# Patient Record
Sex: Male | Born: 1980 | Race: White | Hispanic: No | Marital: Single | Smoking: Former smoker
Health system: Southern US, Community
[De-identification: ages and names within clinical notes are randomized; demographics above are authoritative.]

## PROBLEM LIST (undated history)

## (undated) DIAGNOSIS — F32A Depression, unspecified: Secondary | ICD-10-CM

## (undated) DIAGNOSIS — F419 Anxiety disorder, unspecified: Secondary | ICD-10-CM

## (undated) DIAGNOSIS — F1010 Alcohol abuse, uncomplicated: Secondary | ICD-10-CM

## (undated) DIAGNOSIS — K219 Gastro-esophageal reflux disease without esophagitis: Secondary | ICD-10-CM

## (undated) HISTORY — PX: WISDOM TOOTH EXTRACTION: SHX21

## (undated) HISTORY — PX: TONSILLECTOMY: SUR1361

---

## 2012-04-15 ENCOUNTER — Emergency Department: Payer: Self-pay | Admitting: Emergency Medicine

## 2012-04-25 ENCOUNTER — Emergency Department: Payer: Self-pay | Admitting: Emergency Medicine

## 2018-08-03 ENCOUNTER — Other Ambulatory Visit: Payer: Self-pay

## 2018-08-03 ENCOUNTER — Emergency Department
Admission: EM | Admit: 2018-08-03 | Discharge: 2018-08-03 | Disposition: A | Discharge status: Rehabilitation Facility | Payer: Self-pay | Attending: Emergency Medicine | Admitting: Emergency Medicine

## 2018-08-03 DIAGNOSIS — F10920 Alcohol use, unspecified with intoxication, uncomplicated: Secondary | ICD-10-CM

## 2018-08-03 DIAGNOSIS — F419 Anxiety disorder, unspecified: Secondary | ICD-10-CM

## 2018-08-03 DIAGNOSIS — F1020 Alcohol dependence, uncomplicated: Secondary | ICD-10-CM | POA: Insufficient documentation

## 2018-08-03 DIAGNOSIS — F418 Other specified anxiety disorders: Secondary | ICD-10-CM | POA: Insufficient documentation

## 2018-08-03 LAB — CBC WITH DIFFERENTIAL/PLATELET
Abs Immature Granulocytes: 0.02 10*3/uL (ref 0.00–0.07)
Basophils Absolute: 0.1 10*3/uL (ref 0.0–0.1)
Basophils Relative: 1 %
Eosinophils Absolute: 0 10*3/uL (ref 0.0–0.5)
Eosinophils Relative: 0 %
HCT: 45.4 % (ref 39.0–52.0)
Hemoglobin: 16 g/dL (ref 13.0–17.0)
Immature Granulocytes: 0 %
Lymphocytes Relative: 36 %
Lymphs Abs: 2.6 10*3/uL (ref 0.7–4.0)
MCH: 34 pg (ref 26.0–34.0)
MCHC: 35.2 g/dL (ref 30.0–36.0)
MCV: 96.4 fL (ref 80.0–100.0)
Monocytes Absolute: 0.5 10*3/uL (ref 0.1–1.0)
Monocytes Relative: 7 %
Neutro Abs: 4 10*3/uL (ref 1.7–7.7)
Neutrophils Relative %: 56 %
Platelets: 299 10*3/uL (ref 150–400)
RBC: 4.71 MIL/uL (ref 4.22–5.81)
RDW: 12.7 % (ref 11.5–15.5)
WBC: 7.1 10*3/uL (ref 4.0–10.5)
nRBC: 0 % (ref 0.0–0.2)

## 2018-08-03 LAB — COMPREHENSIVE METABOLIC PANEL
ALT: 92 U/L — ABNORMAL HIGH (ref 0–44)
AST: 75 U/L — ABNORMAL HIGH (ref 15–41)
Albumin: 4.1 g/dL (ref 3.5–5.0)
Alkaline Phosphatase: 62 U/L (ref 38–126)
Anion gap: 15 (ref 5–15)
BUN: 14 mg/dL (ref 6–20)
CO2: 23 mmol/L (ref 22–32)
Calcium: 8.8 mg/dL — ABNORMAL LOW (ref 8.9–10.3)
Chloride: 104 mmol/L (ref 98–111)
Creatinine, Ser: 0.92 mg/dL (ref 0.61–1.24)
GFR calc Af Amer: >60 mL/min (ref >60)
GFR calc non Af Amer: >60 mL/min (ref >60)
Glucose, Bld: 112 mg/dL — ABNORMAL HIGH (ref 70–99)
Potassium: 3.5 mmol/L (ref 3.5–5.1)
Sodium: 142 mmol/L (ref 135–145)
Total Bilirubin: 0.6 mg/dL (ref 0.3–1.2)
Total Protein: 7.3 g/dL (ref 6.5–8.1)

## 2018-08-03 LAB — URINE DRUG SCREEN, QUALITATIVE (ARMC ONLY)
Amphetamines, Ur Screen: NOT DETECTED
Barbiturates, Ur Screen: NOT DETECTED
Benzodiazepine, Ur Scrn: NOT DETECTED
Cannabinoid 50 Ng, Ur ~~LOC~~: NOT DETECTED
Cocaine Metabolite,Ur ~~LOC~~: NOT DETECTED
MDMA (Ecstasy)Ur Screen: NOT DETECTED
Methadone Scn, Ur: NOT DETECTED
Opiate, Ur Screen: NOT DETECTED
Phencyclidine (PCP) Ur S: NOT DETECTED
Tricyclic, Ur Screen: NOT DETECTED

## 2018-08-03 LAB — URINALYSIS, COMPLETE (UACMP) WITH MICROSCOPIC
Bacteria, UA: NONE SEEN
Bilirubin Urine: NEGATIVE
Glucose, UA: NEGATIVE mg/dL
Hgb urine dipstick: NEGATIVE
Ketones, ur: 5 mg/dL — AB
Leukocytes, UA: NEGATIVE
Nitrite: NEGATIVE
Protein, ur: 30 mg/dL — AB
Specific Gravity, Urine: 1.015 (ref 1.005–1.030)
pH: 7 (ref 5.0–8.0)

## 2018-08-03 LAB — ETHANOL
Alcohol, Ethyl (B): 333 mg/dL (ref <10)
Alcohol, Ethyl (B): 233 mg/dL — ABNORMAL HIGH (ref <10)

## 2018-08-03 MED ORDER — CALCIUM CARBONATE ANTACID 500 MG PO CHEW
1.0000 | CHEWABLE_TABLET | Freq: Once | ORAL | Status: AC
  Administered 2018-08-03: 200 mg via ORAL

## 2018-08-03 MED ORDER — LORAZEPAM 1 MG PO TABS
1.0000 mg | ORAL_TABLET | Freq: Once | ORAL | Status: AC
  Administered 2018-08-03: 1 mg via ORAL
  Filled 2018-08-03: qty 1

## 2018-08-03 MED ORDER — LORAZEPAM 2 MG/ML IJ SOLN
1.0000 mg | Freq: Once | INTRAMUSCULAR | Status: AC
  Administered 2018-08-03: 1 mg via INTRAVENOUS
  Filled 2018-08-03: qty 1

## 2018-08-03 MED ORDER — CALCIUM CARBONATE ANTACID 500 MG PO CHEW
CHEWABLE_TABLET | ORAL | Status: AC
  Administered 2018-08-03: 200 mg via ORAL
  Filled 2018-08-03: qty 1

## 2018-08-03 MED ORDER — SODIUM CHLORIDE 0.9 % IV BOLUS
1000.0000 mL | Freq: Once | INTRAVENOUS | Status: AC
  Administered 2018-08-03: 1000 mL via INTRAVENOUS

## 2018-08-03 NOTE — ED Notes (Signed)
Pt denies SI/HI/AVH. Pt given discharge instructions including f/u appointments in route to [facility]. Pt states understanding. Pt states receipt of all belongings. Patient accompanied by mother.

## 2018-08-03 NOTE — Discharge Instructions (Addendum)
Go directly to [facility].  Return to emergency department immediately for any worsening condition including confusion altered mental status, fever, thoughts of wanting to hurt yourself or others, seizure, or any other symptoms concerning to you.

## 2018-08-03 NOTE — ED Notes (Signed)
Pt ambulates to the bathroom with a steady gait.

## 2018-08-03 NOTE — BH Assessment (Addendum)
Assessment Note  Chad Walsh is an 37 y.o. male who presents to the ED requesting detox treatment for his alcohol use disorder. Pt reports having "extreme anxiety and it's been getting worse. He drank "a liter of alcohol yesterday and a fifth of liquor today". Pt reports dx of depression and anxiety. He shared that he often binge drinks and drinks "to help me sleep". He reports recent life stressors with his family unit. He currently lives with his sister, brother-in-law, and his mother. He shared that his father recently kicked him out of the house after mom and dad divorced. He denied use of any other drugs. He is currently employed with "[employer]". He reports experiencing withdrawal typically in the AM and "drinks to function daily". He denied past treatment history. He denied SI/HI/AVH. Although, pt reports past suicide attempts to overdose "years ago". He reports having poor sleep patterns and this triggers his drinking binges. Pt was coherent in his speech and appeared to process his thoughts well enough to engage in TTS assessment. He was not observed displaying any psychotic behaviors.   Diagnosis:  Major Depressive Disorder, with anxiety Alcohol Use Disorder, Severe  Past Medical History: History reviewed. No pertinent past medical history.  History reviewed. No pertinent surgical history.  Family History: No family history on file.  Social History:  reports that he drinks alcohol. His tobacco and drug histories are not on file.  Additional Social History:  Alcohol / Drug Use Pain Medications: See MAR Prescriptions: See MAR Over the Counter: See MAR History of alcohol / drug use?: Yes Longest period of sobriety (when/how long): Unable to Quantify Negative Consequences of Use: Financial, Personal relationships, Work / School Withdrawal Symptoms: Agitation, Weakness, Sweats, Irritability, Nausea / Vomiting Substance #1 Name of Substance 1: Alcohol 1 - Age of First Use: Unable to  Recall 1 - Amount (size/oz): "A liter of alcohol yesterday and a fifth of liquor last night" 1 - Frequency: Binge drinking 1 - Duration: "years" 1 - Last Use / Amount: 08/03/2018  CIWA: CIWA-Ar BP: (!) 139/94 Pulse Rate: (!) 115 COWS:    Allergies: No Known Allergies  Home Medications:  (Not in a hospital admission)  OB/GYN Status:  No LMP for male patient.  General Assessment Data Location of Assessment: [facility] ED TTS Assessment: In system Is this a Tele or Face-to-Face Assessment?: Face-to-Face Is this an Initial Assessment or a Re-assessment for this encounter?: Initial Assessment Patient Accompanied by:: N/A Language Other than English: No Living Arrangements: Other (Comment)(Private Dwelling) What gender do you identify as?: Male Marital status: Divorced [place] name: n/a Pregnancy Status: No Living Arrangements: Parent, Other relatives(Pt lives in his sister's home where his mother also lives.) Can pt return to current living arrangement?: Yes Admission Status: Voluntary Is patient capable of signing voluntary admission?: Yes Referral Source: Self/Family/Friend Insurance type: None  Medical Screening Exam [facility] Walk-in ONLY) Medical Exam completed: Yes  Crisis Care Plan Living Arrangements: Parent, Other relatives(Pt lives in his sister's home where his mother also lives.) Legal Guardian: Other:(Self) Name of Psychiatrist: None Reported Name of Therapist: None Reported  Education Status Is patient currently in school?: No Is the patient employed, unemployed or receiving disability?: Employed([employer] - 8 hour shifts)  Risk to self with the past 6 months Suicidal Ideation: No Has patient been a risk to self within the past 6 months prior to admission? : No Suicidal Intent: No Has patient had any suicidal intent within the past 6 months prior to admission? :  No Is patient at risk for suicide?: No Suicidal Plan?: No Has patient had any suicidal plan within the past  6 months prior to admission? : No Access to Means: No What has been your use of drugs/alcohol within the last 12 months?: Alcohol Previous Attempts/Gestures: Yes How many times?: ("a couple of times - I've tried to overdose") Other Self Harm Risks: Severe Alcohol Use Triggers for Past Attempts: Family contact, Spouse contact Intentional Self Injurious Behavior: None Family Suicide History: No("I've only seen people post stuff on facebook") Recent stressful life event(s): Conflict (Comment), Loss (Comment), Job Loss, Surveyor, quantityinancial Problems, Turmoil (Comment)(Family stressors) Persecutory voices/beliefs?: No Depression: Yes Depression Symptoms: Despondent, Insomnia, Tearfulness, Isolating, Fatigue, Guilt, Loss of interest in usual pleasures, Feeling worthless/self pity Substance abuse history and/or treatment for substance abuse?: Yes Suicide prevention information given to non-admitted patients: Not applicable  Risk to Others within the past 6 months Homicidal Ideation: No Does patient have any lifetime risk of violence toward others beyond the six months prior to admission? : No Thoughts of Harm to Others: No Current Homicidal Intent: No Current Homicidal Plan: No Access to Homicidal Means: No Identified Victim: None Reported History of harm to others?: No Assessment of Violence: None Noted Violent Behavior Description: None reported Does patient have access to weapons?: No Criminal Charges Pending?: No Does patient have a court date: No Is patient on probation?: No  Psychosis Hallucinations: None noted Delusions: None noted  Mental Status Report Appearance/Hygiene: Other (Comment)(Pt still wearing street clothes) Eye Contact: Good Motor Activity: Freedom of movement, Agitation, Restlessness Speech: Logical/coherent Level of Consciousness: Alert Mood: Depressed, Despair, Guilty Affect: Sad, Depressed Anxiety Level: Moderate Thought Processes: Coherent, Relevant Judgement:  Impaired(BAC .333) Orientation: Person, Place, Time, Situation, Appropriate for developmental age Obsessive Compulsive Thoughts/Behaviors: None  Cognitive Functioning Concentration: Normal Memory: Recent Intact, Remote Intact Is patient IDD: No Insight: Good Impulse Control: Good Appetite: Poor Have you had any weight changes? : No Change Sleep: Decreased Total Hours of Sleep: 1 Vegetative Symptoms: None  ADLScreening [facility] Assessment Services) Patient's cognitive ability adequate to safely complete daily activities?: Yes Patient able to express need for assistance with ADLs?: Yes Independently performs ADLs?: Yes (appropriate for developmental age)  Prior Inpatient Therapy Prior Inpatient Therapy: No  Prior Outpatient Therapy Prior Outpatient Therapy: No Does patient have an ACCT team?: No Does patient have Intensive In-House Services?  : No Does patient have [facility] services? : No Does patient have P4CC services?: No  ADL Screening (condition at time of admission) Patient's cognitive ability adequate to safely complete daily activities?: Yes Patient able to express need for assistance with ADLs?: Yes Independently performs ADLs?: Yes (appropriate for developmental age)       Abuse/Neglect Assessment (Assessment to be complete while patient is alone) Abuse/Neglect Assessment Can Be Completed: Yes Physical Abuse: Denies Verbal Abuse: Denies Sexual Abuse: Denies Exploitation of patient/patient's resources: Denies Self-Neglect: Denies Values / Beliefs Cultural Requests During Hospitalization: None Spiritual Requests During Hospitalization: None Consults Spiritual Care Consult Needed: No Social Work Consult Needed: No Merchant navy officerAdvance Directives (For Healthcare) Does Patient Have a Medical Advance Directive?: (UTa)       Child/Adolescent Assessment Running Away Risk: (Patient is an adult)  Disposition:  Disposition Initial Assessment Completed for this Encounter:  Yes Disposition of Patient: Admit Type of inpatient treatment program: Adult Patient refused recommended treatment: No Mode of transportation if patient is discharged/movement?: N/A Patient referred to: [facility]  On Site Evaluation by:   Reviewed with Physician:  Wilmon Arms 08/03/2018 1:26 PM

## 2018-08-03 NOTE — ED Notes (Signed)
Pt asks for tums, MD gives verbal order for 1 calcium carbonate, see MAR
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
## 2018-08-03 NOTE — ED Notes (Signed)
Apple juice given to patient.

## 2018-08-03 NOTE — ED Notes (Signed)
TTS speaking with patient in consult room.
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
## 2018-08-03 NOTE — BH Assessment (Addendum)
TTS spoke with pt who reports he is willing to engage in detox treatment. Pt gave verbal consent to this writer to fax his ED labs and paperwork to [facility] [phone]) to be reviewed for potential detox placement.  This information was relayed to pt's nurse and EDP, Dr. Shaune PollackLord.
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
## 2018-08-03 NOTE — ED Notes (Signed)
Pt uncooperative with this RN regarding any questions other than PMH and allergies.

## 2018-08-03 NOTE — BH Assessment (Addendum)
This Clinical research associatewriter spoke to Chad Walsh with [facility] [phone]) who reports pt has been accepted for detox treatment. He requested that pt get there ASAP before shift change. Pt is being transported by his mother.  This information was relayed to pt's nurse.

## 2018-08-03 NOTE — ED Provider Notes (Addendum)
[facility] Emergency Department Provider Note ____________________________________________   I have reviewed the triage vital signs and the triage nursing note.  HISTORY  Chief Complaint Medical Exam   Historian Level 5 Caveat History Limited by poor historian  HPI Chad Walsh is a 37 y.o. male brought in by EMS, patient stating he "just does not feel well.  "He is denying chest pain, nausea, vomiting, abdominal pain, headache, weakness, numbness.  He does endorse anxiety.  Denies depression or suicidal ideation.  He has been drinking alcohol.  Symptoms are moderate.  Unable to determine whether or not he is actually has a previous psychiatric diagnosis or not.     History reviewed. No pertinent past medical history.  There are no active problems to display for this patient.   History reviewed. No pertinent surgical history.  Prior to Admission medications   Not on File    No Known Allergies  No family history on file.  Social History Social History   Tobacco Use  . Smoking status: Unknown If Ever Smoked  Substance Use Topics  . Alcohol use: Yes  . Drug use: Not on file    Review of Systems Somewhat limited as patient is intoxicated and a poor historian but these are his answers. Constitutional: Negative for fever. Eyes: Negative for visual changes. ENT: Negative for sore throat. Cardiovascular: Negative for chest pain. Respiratory: Negative for shortness of breath. Gastrointestinal: Negative for abdominal pain, vomiting and diarrhea. Genitourinary: Negative for dysuria. Musculoskeletal: Negative for back pain. Skin: Negative for rash. Neurological: Negative for headache.  ____________________________________________   PHYSICAL EXAM:  VITAL SIGNS: ED Triage Vitals [08/03/18 0847]  Enc Vitals Group     BP (!) 159/104     Pulse Rate (!) 121     Resp 20     Temp 98.2 F (36.8 C)     Temp Source Oral     SpO2 100 %      Weight 160 lb (72.6 kg)     Height 5\' 10"  (1.778 m)     Head Circumference      Peak Flow      Pain Score      Pain Loc      Pain Edu?      Excl. in GC?      Constitutional: Alert and cooperative, highly anxious.  smells of alcohol.Marland Kitchen.  HEENT      Head: Normocephalic and atraumatic.      Eyes: Conjunctivae are normal. Pupils equal and round.       Ears:         Nose: No congestion/rhinnorhea.      Mouth/Throat: Mucous membranes are moist.      Neck: No stridor. Cardiovascular/Chest: Normal rate, regular rhythm.  No murmurs, rubs, or gallops. Respiratory: Normal respiratory effort without tachypnea nor retractions. Breath sounds are clear and equal bilaterally. No wheezes/rales/rhonchi. Gastrointestinal: Soft. No distention, no guarding, no rebound. Nontender.    Genitourinary/rectal:Deferred Musculoskeletal: Nontender with normal range of motion in all extremities. No joint effusions.  No lower extremity tenderness.  No edema. Neurologic: No facial droop.  Normal speech and language. No gross or focal neurologic deficits are appreciated. Skin:  Skin is warm, dry and intact. No rash noted. Psychiatric: Highly anxious.  Denies depression, hallucinations, suicidal or homicidal ideation.   ____________________________________________  LABS (pertinent positives/negatives) I, Governor Rooksebecca Breanda Greenlaw, MD the attending physician have reviewed the labs noted below.  Labs Reviewed  COMPREHENSIVE METABOLIC PANEL - Abnormal; Notable for  the following components:      Result Value   Glucose, Bld 112 (*)    Calcium 8.8 (*)    AST 75 (*)    ALT 92 (*)    All other components within normal limits  ETHANOL - Abnormal; Notable for the following components:   Alcohol, Ethyl (B) 333 (*)    All other components within normal limits  URINALYSIS, COMPLETE (UACMP) WITH MICROSCOPIC - Abnormal; Notable for the following components:   Color, Urine YELLOW (*)    APPearance CLOUDY (*)    Ketones, ur 5 (*)     Protein, ur 30 (*)    All other components within normal limits  ETHANOL - Abnormal; Notable for the following components:   Alcohol, Ethyl (B) 233 (*)    All other components within normal limits  CBC WITH DIFFERENTIAL/PLATELET  URINE DRUG SCREEN, QUALITATIVE ([facility] ONLY)  ETHANOL    ____________________________________________    EKG I, Governor Rooks, MD, the attending physician have personally viewed and interpreted all ECGs.  None ____________________________________________  RADIOLOGY   None __________________________________________  PROCEDURES  Procedure(s) performed: None  Procedures  Critical Care performed: None   ____________________________________________  ED COURSE / ASSESSMENT AND PLAN  Pertinent labs & imaging results that were available during my care of the patient were reviewed by me and considered in my medical decision making (see chart for details).     Patient is intoxicated, and states he just feels bad.  No focal complaints.  No significant medical concerning findings on physical exam.  Does not meet criteria for involuntary psychiatric treatment at this point time.  He is voluntary for evaluation.  TTS inform me that patient is accepted to [facility].  CONSULTATIONS:  TTS.   Patient / Family / Caregiver informed of clinical course, medical decision-making process, and agree with plan.   ___________________________________________   FINAL CLINICAL IMPRESSION(S) / ED DIAGNOSES   Final diagnoses:  Anxiety  Alcoholic intoxication without complication (HCC)      ___________________________________________         Note: This dictation was prepared with Dragon dictation. Any transcriptional errors that result from this process are unintentional    Governor Rooks, MD 08/03/18 1313    Governor Rooks, MD 08/03/18 1314    Governor Rooks, MD 08/03/18 [phone]
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
## 2019-12-05 ENCOUNTER — Ambulatory Visit: Payer: Self-pay | Attending: Internal Medicine

## 2019-12-05 ENCOUNTER — Other Ambulatory Visit: Payer: Self-pay

## 2019-12-05 DIAGNOSIS — Z23 Encounter for immunization: Secondary | ICD-10-CM

## 2019-12-05 NOTE — Progress Notes (Signed)
   Covid-19 Vaccination Clinic  Name:  Chad Walsh    MRN: [id number] DOB: 1981-07-16  12/05/2019  [name] was observed post Covid-19 immunization for 15 minutes without incident. He was provided with Vaccine Information Sheet and instruction to access the V-Safe system.   [name] was instructed to call 911 with any severe reactions post vaccine: Marland Kitchen Difficulty breathing  . Swelling of face and throat  . A fast heartbeat  . A bad rash all over body  . Dizziness and weakness   Immunizations Administered    Name Date Dose VIS Date Route   Pfizer COVID-19 Vaccine 12/05/2019  8:43 AM 0.3 mL 08/05/2019 Intramuscular   Manufacturer: [employer], [employer]   Lot: [id number]   NDC: [address]-5

## 2019-12-31 ENCOUNTER — Ambulatory Visit: Payer: Self-pay | Attending: Internal Medicine

## 2019-12-31 DIAGNOSIS — Z23 Encounter for immunization: Secondary | ICD-10-CM

## 2019-12-31 NOTE — Progress Notes (Signed)
   Covid-19 Vaccination Clinic  Name:  Chad Walsh    MRN: [id number] DOB: [birth year]  12/31/2019  Chad Walsh was observed post Covid-19 immunization for 15 minutes without incident. He was provided with Vaccine Information Sheet and instruction to access the V-Safe system.   Chad Walsh was instructed to call 911 with any severe reactions post vaccine: Marland Kitchen Difficulty breathing  . Swelling of face and throat  . A fast heartbeat  . A bad rash all over body  . Dizziness and weakness   Immunizations Administered    Name Date Dose VIS Date Route   Pfizer COVID-19 Vaccine 12/31/2019  1:52 PM 0.3 mL 10/19/2018 Intramuscular   Manufacturer: [employer], [employer]   Lot: [id number]   NDC: [address]-2

## 2020-07-31 ENCOUNTER — Other Ambulatory Visit: Payer: Self-pay

## 2020-07-31 ENCOUNTER — Emergency Department
Admission: EM | Admit: 2020-07-31 | Discharge: 2020-08-02 | Disposition: A | Discharge status: Other Acute Inpatient Hospital | Payer: Self-pay | Attending: Emergency Medicine | Admitting: Emergency Medicine

## 2020-07-31 DIAGNOSIS — K219 Gastro-esophageal reflux disease without esophagitis: Secondary | ICD-10-CM

## 2020-07-31 DIAGNOSIS — F4321 Adjustment disorder with depressed mood: Secondary | ICD-10-CM

## 2020-07-31 DIAGNOSIS — F1010 Alcohol abuse, uncomplicated: Secondary | ICD-10-CM

## 2020-07-31 DIAGNOSIS — K8020 Calculus of gallbladder without cholecystitis without obstruction: Secondary | ICD-10-CM | POA: Insufficient documentation

## 2020-07-31 DIAGNOSIS — F10239 Alcohol dependence with withdrawal, unspecified: Secondary | ICD-10-CM | POA: Insufficient documentation

## 2020-07-31 DIAGNOSIS — F329 Major depressive disorder, single episode, unspecified: Secondary | ICD-10-CM | POA: Insufficient documentation

## 2020-07-31 DIAGNOSIS — Z20822 Contact with and (suspected) exposure to covid-19: Secondary | ICD-10-CM | POA: Insufficient documentation

## 2020-07-31 DIAGNOSIS — E559 Vitamin D deficiency, unspecified: Secondary | ICD-10-CM

## 2020-07-31 DIAGNOSIS — F32A Depression, unspecified: Secondary | ICD-10-CM

## 2020-07-31 DIAGNOSIS — R7401 Elevation of levels of liver transaminase levels: Secondary | ICD-10-CM | POA: Insufficient documentation

## 2020-07-31 DIAGNOSIS — F10939 Alcohol use, unspecified with withdrawal, unspecified: Secondary | ICD-10-CM

## 2020-07-31 HISTORY — DX: Alcohol abuse, uncomplicated: F10.10

## 2020-07-31 LAB — URINE DRUG SCREEN, QUALITATIVE (ARMC ONLY)
Amphetamines, Ur Screen: NOT DETECTED
Barbiturates, Ur Screen: NOT DETECTED
Benzodiazepine, Ur Scrn: POSITIVE — AB
Cannabinoid 50 Ng, Ur ~~LOC~~: NOT DETECTED
Cocaine Metabolite,Ur ~~LOC~~: NOT DETECTED
MDMA (Ecstasy)Ur Screen: NOT DETECTED
Methadone Scn, Ur: NOT DETECTED
Opiate, Ur Screen: NOT DETECTED
Phencyclidine (PCP) Ur S: NOT DETECTED
Tricyclic, Ur Screen: NOT DETECTED

## 2020-07-31 LAB — CBC
HCT: 41.1 % (ref 39.0–52.0)
Hemoglobin: 14.4 g/dL (ref 13.0–17.0)
MCH: 34.9 pg — ABNORMAL HIGH (ref 26.0–34.0)
MCHC: 35 g/dL (ref 30.0–36.0)
MCV: 99.5 fL (ref 80.0–100.0)
Platelets: 78 10*3/uL — ABNORMAL LOW (ref 150–400)
RBC: 4.13 MIL/uL — ABNORMAL LOW (ref 4.22–5.81)
RDW: 16.9 % — ABNORMAL HIGH (ref 11.5–15.5)
WBC: 7 10*3/uL (ref 4.0–10.5)
nRBC: 0 % (ref 0.0–0.2)

## 2020-07-31 LAB — COMPREHENSIVE METABOLIC PANEL
ALT: 122 U/L — ABNORMAL HIGH (ref 0–44)
AST: 340 U/L — ABNORMAL HIGH (ref 15–41)
Albumin: 4.4 g/dL (ref 3.5–5.0)
Alkaline Phosphatase: 135 U/L — ABNORMAL HIGH (ref 38–126)
Anion gap: 17 — ABNORMAL HIGH (ref 5–15)
BUN: <5 mg/dL — ABNORMAL LOW (ref 6–20)
CO2: 28 mmol/L (ref 22–32)
Calcium: 9.1 mg/dL (ref 8.9–10.3)
Chloride: 97 mmol/L — ABNORMAL LOW (ref 98–111)
Creatinine, Ser: 0.7 mg/dL (ref 0.61–1.24)
GFR, Estimated: >60 mL/min (ref >60)
Glucose, Bld: 162 mg/dL — ABNORMAL HIGH (ref 70–99)
Potassium: 3.2 mmol/L — ABNORMAL LOW (ref 3.5–5.1)
Sodium: 142 mmol/L (ref 135–145)
Total Bilirubin: 2.3 mg/dL — ABNORMAL HIGH (ref 0.3–1.2)
Total Protein: 7.8 g/dL (ref 6.5–8.1)

## 2020-07-31 LAB — SALICYLATE LEVEL: Salicylate Lvl: <7 mg/dL — ABNORMAL LOW (ref 7.0–30.0)

## 2020-07-31 LAB — ETHANOL: Alcohol, Ethyl (B): 263 mg/dL — ABNORMAL HIGH (ref <10)

## 2020-07-31 LAB — ACETAMINOPHEN LEVEL: Acetaminophen (Tylenol), Serum: <10 ug/mL — ABNORMAL LOW (ref 10–30)

## 2020-07-31 NOTE — ED Notes (Signed)
Pt also states he feels like his body is giving out, which is why he came to ED today.

## 2020-07-31 NOTE — ED Notes (Signed)
Upon continuing triage process, pt endorses SI. See assessment.

## 2020-07-31 NOTE — ED Notes (Signed)
Pt provided with water per request

## 2020-07-31 NOTE — ED Notes (Signed)
Pt ambulatory to restroom for second time now

## 2020-07-31 NOTE — ED Triage Notes (Signed)
EMS brings pt in from home for "feeling unwell"; +ETOH

## 2020-07-31 NOTE — ED Notes (Signed)
Patient transferred from Triage to room after dressing out and screening for contraband. Report received from [place], [place] including situation, background, assessment and recommendations. Pt oriented to [employer] including Q15 minute rounds as well as Psychologist, counselling for their protection. Patient is alert and oriented, warm and dry in no acute distress. Patient denies HI and AVH. Pt. Encouraged to let this nurse know if needs arise.

## 2020-07-31 NOTE — ED Notes (Signed)
Pt reports mother died x 2 weeks ago, states he is having difficulty coping with death. Pt states he last had alcohol at 4PM but reported he had been drinking through night before, denies any food for 2 days. Pt states he normally drinks only on weekends due to employment and new job. Pt has tremor in bed at this time. Tearful when brings up topic of mother

## 2020-07-31 NOTE — ED Triage Notes (Signed)
PT to ED via EMS from home. PT brought in d/t not feeling good. PT states he feels like his body is giving out on him and that he hasn't eaten in days. When asked why he isn't eating, pt states he is grieving the loss of his other.No pain, +ETOH

## 2020-07-31 NOTE — ED Notes (Signed)
Pt dressed into hospital scrubs by this RN and Misty Stanley NT. PT belongings placed in bag and labeled. Belongings include: Black Product manager  2 Black socks  Black belt  Cellphone  Blue boxers

## 2020-08-01 ENCOUNTER — Emergency Department: Payer: Self-pay

## 2020-08-01 ENCOUNTER — Encounter: Payer: Self-pay | Admitting: Emergency Medicine

## 2020-08-01 DIAGNOSIS — K219 Gastro-esophageal reflux disease without esophagitis: Secondary | ICD-10-CM

## 2020-08-01 DIAGNOSIS — E559 Vitamin D deficiency, unspecified: Secondary | ICD-10-CM

## 2020-08-01 DIAGNOSIS — F1010 Alcohol abuse, uncomplicated: Secondary | ICD-10-CM

## 2020-08-01 DIAGNOSIS — F32A Depression, unspecified: Secondary | ICD-10-CM

## 2020-08-01 DIAGNOSIS — F4321 Adjustment disorder with depressed mood: Secondary | ICD-10-CM

## 2020-08-01 LAB — RESP PANEL BY RT-PCR (FLU A&B, COVID) ARPGX2
Influenza A by PCR: NEGATIVE
Influenza B by PCR: NEGATIVE
SARS Coronavirus 2 by RT PCR: NEGATIVE

## 2020-08-01 LAB — COMPREHENSIVE METABOLIC PANEL
ALT: 108 U/L — ABNORMAL HIGH (ref 0–44)
AST: 302 U/L — ABNORMAL HIGH (ref 15–41)
Albumin: 3.8 g/dL (ref 3.5–5.0)
Alkaline Phosphatase: 116 U/L (ref 38–126)
Anion gap: 13 (ref 5–15)
BUN: <5 mg/dL — ABNORMAL LOW (ref 6–20)
CO2: 27 mmol/L (ref 22–32)
Calcium: 8.5 mg/dL — ABNORMAL LOW (ref 8.9–10.3)
Chloride: 98 mmol/L (ref 98–111)
Creatinine, Ser: 0.68 mg/dL (ref 0.61–1.24)
GFR, Estimated: >60 mL/min (ref >60)
Glucose, Bld: 289 mg/dL — ABNORMAL HIGH (ref 70–99)
Potassium: 3.2 mmol/L — ABNORMAL LOW (ref 3.5–5.1)
Sodium: 138 mmol/L (ref 135–145)
Total Bilirubin: 2.1 mg/dL — ABNORMAL HIGH (ref 0.3–1.2)
Total Protein: 7.3 g/dL (ref 6.5–8.1)

## 2020-08-01 MED ORDER — DEXTROSE-NACL 5-0.9 % IV SOLN
1000.0000 mL | Freq: Once | INTRAVENOUS | Status: AC
  Administered 2020-08-01: 1000 mL via INTRAVENOUS

## 2020-08-01 MED ORDER — IBUPROFEN 600 MG PO TABS
600.0000 mg | ORAL_TABLET | Freq: Once | ORAL | Status: AC
  Administered 2020-08-01: 600 mg via ORAL
  Filled 2020-08-01: qty 1

## 2020-08-01 MED ORDER — LORAZEPAM 2 MG PO TABS: 0.0000 mg | ORAL_TABLET | Freq: Two times a day (BID) | ORAL | Status: DC

## 2020-08-01 MED ORDER — LORAZEPAM 2 MG/ML IJ SOLN: 0.0000 mg | Freq: Two times a day (BID) | INTRAMUSCULAR | Status: DC

## 2020-08-01 MED ORDER — LORAZEPAM 2 MG/ML IJ SOLN: 0.0000 mg | Freq: Four times a day (QID) | INTRAMUSCULAR | Status: DC

## 2020-08-01 MED ORDER — VITAMIN D 25 MCG (1000 UNIT) PO TABS
2000.0000 [IU] | ORAL_TABLET | Freq: Every day | ORAL | Status: DC
  Administered 2020-08-01: 2000 [IU] via ORAL
  Filled 2020-08-01: qty 2

## 2020-08-01 MED ORDER — PANTOPRAZOLE SODIUM 40 MG PO TBEC
40.0000 mg | DELAYED_RELEASE_TABLET | Freq: Every day | ORAL | Status: DC
  Administered 2020-08-01: 40 mg via ORAL
  Filled 2020-08-01: qty 1

## 2020-08-01 MED ORDER — LORAZEPAM 2 MG PO TABS
0.0000 mg | ORAL_TABLET | Freq: Four times a day (QID) | ORAL | Status: DC
  Administered 2020-08-01: 2 mg via ORAL
  Administered 2020-08-01: 1 mg via ORAL
  Administered 2020-08-01: 2 mg via ORAL
  Administered 2020-08-02: 1 mg via ORAL
  Administered 2020-08-02: 2 mg via ORAL
  Filled 2020-08-01 (×5): qty 1

## 2020-08-01 MED ORDER — THIAMINE HCL 100 MG PO TABS
100.0000 mg | ORAL_TABLET | Freq: Once | ORAL | Status: AC
  Administered 2020-08-01: 100 mg via ORAL
  Filled 2020-08-01: qty 1

## 2020-08-01 MED ORDER — ONDANSETRON HCL 4 MG PO TABS
4.0000 mg | ORAL_TABLET | Freq: Three times a day (TID) | ORAL | Status: DC | PRN
  Administered 2020-08-01: 4 mg via ORAL
  Filled 2020-08-01: qty 1

## 2020-08-01 MED ORDER — ACETAMINOPHEN 500 MG PO TABS: 1000.0000 mg | ORAL_TABLET | Freq: Once | ORAL | Status: DC

## 2020-08-01 NOTE — ED Notes (Signed)
Pt complains of feeling weak and unsure of what to do with himself at this time. Pt states that he needs his vitals checked at this time. Will continue to monitor.

## 2020-08-01 NOTE — ED Notes (Signed)
Pt remains at US at this time.

## 2020-08-01 NOTE — BH Assessment (Signed)
Patient has been accepted to [facility] ([phone])  Accepting provider: Allayne Gitelman, NP.  Call report to [phone].  Representative was Susie.   ER Staff is aware of it:  Lynden Ang, ER Secretary  Dr. Katrinka Blazing, ER MD  Thayer Ohm, Patient's Nurse  Pt can be transported at 9am.

## 2020-08-01 NOTE — Consult Note (Signed)
[facility] Face-to-Face Psychiatry Consult   Reason for Consult: Consult for 39 year old man without a past psychiatric history who comes to [place] for alcohol abuse Referring Physician: Scotty Court Patient Identification: Chad Walsh MRN:  [id number] Principal Diagnosis: Alcohol abuse Diagnosis:  Principal Problem:   Alcohol abuse Active Problems:   Depression   Grieving   Gastric reflux   Vitamin D deficiency   Total Time spent with patient: 1 hour  Subjective:   Chad Walsh is a 39 y.o. male patient admitted with "I need help with drinking".  HPI: Patient seen and chart reviewed.  This patient came to the emergency room voluntarily seeking assistance for alcohol abuse.  He says he is feeling miserable physically sick and depressed.  He has been drinking 10-12 beers a day for the last couple weeks.  The current binge started when his mother passed away after a brief illness.  Denies that he has been using any other drugs.  Mood is been dysphoric grieving and down.  Denies any suicidal or homicidal thought.  Denies any violence or thought of violence.  No hallucinations.  Patient has no history of delirium tremens or alcohol withdrawal seizures.  No history of substance abuse treatment in the past.  Describes chronic anxiety for which she has not received past treatment.  Currently cooperative.  Past Psychiatric History: Apparently never had mental health treatment in the past.  No prior admissions no substance abuse treatment.  He says about 10 years ago he took an overdose but never got any follow-up treatment for it and he thinks it was mostly a "cry for help".  Risk to Self: Suicidal Ideation: No Suicidal Intent: No Is patient at risk for suicide?: No Suicidal Plan?: No Access to Means: No What has been your use of drugs/alcohol within the last 12 months?: Alcohol How many times?: 0 Other Self Harm Risks: Active Alcohol Use Triggers for Past Attempts: None known Intentional  Self Injurious Behavior: None Risk to Others: Homicidal Ideation: No Thoughts of Harm to Others: No Current Homicidal Intent: No Current Homicidal Plan: No Access to Homicidal Means: No Identified Victim: Reports of none History of harm to others?: No Assessment of Violence: None Noted Violent Behavior Description: Reports of none Does patient have access to weapons?: No Criminal Charges Pending?: No Does patient have a court date: No Prior Inpatient Therapy: Prior Inpatient Therapy: No Prior Outpatient Therapy: Prior Outpatient Therapy: No Does patient have an ACCT team?: No Does patient have Intensive In-House Services?  : No Does patient have [facility] services? : No Does patient have P4CC services?: No  Past Medical History:  Past Medical History:  Diagnosis Date  . ETOH abuse    History reviewed. No pertinent surgical history. Family History: History reviewed. No pertinent family history. Family Psychiatric  History: Positive for multiple people with depression and an uncle who suicided Social History:  Social History   Substance and Sexual Activity  Alcohol Use Yes   Comment: as of 07/31/20 last drink was 4pm. Pt states does not drink everyday      Social History   Substance and Sexual Activity  Drug Use Never    Social History   Socioeconomic History  . Marital status: Single    Spouse name: Not on file  . Number of children: Not on file  . Years of education: Not on file  . Highest education level: Not on file  Occupational History  . Not on file  Tobacco Use  . Smoking  status: Never Smoker  . Smokeless tobacco: Never Used  Vaping Use  . Vaping Use: Never used  Substance and Sexual Activity  . Alcohol use: Yes    Comment: as of 07/31/20 last drink was 4pm. Pt states does not drink everyday   . Drug use: Never  . Sexual activity: Not on file  Other Topics Concern  . Not on file  Social History Narrative  . Not on file   Social Determinants of Health    Financial Resource Strain:   . Difficulty of Paying Living Expenses: Not on file  Food Insecurity:   . Worried About Programme researcher, broadcasting/film/video in the Last Year: Not on file  . Ran Out of Food in the Last Year: Not on file  Transportation Needs:   . Lack of Transportation (Medical): Not on file  . Lack of Transportation (Non-Medical): Not on file  Physical Activity:   . Days of Exercise per Week: Not on file  . Minutes of Exercise per Session: Not on file  Stress:   . Feeling of Stress : Not on file  Social Connections:   . Frequency of Communication with Friends and Family: Not on file  . Frequency of Social Gatherings with Friends and Family: Not on file  . Attends Religious Services: Not on file  . Active Member of Clubs or Organizations: Not on file  . Attends Banker Meetings: Not on file  . Marital Status: Not on file   Additional Social History:    Allergies:  No Known Allergies  Labs:  Results for orders placed or performed during the hospital encounter of 07/31/20 (from the past 48 hour(s))  Comprehensive metabolic panel     Status: Abnormal   Collection Time: 07/31/20 10:37 PM  Result Value Ref Range   Sodium 142 135 - 145 mmol/L   Potassium 3.2 (L) 3.5 - 5.1 mmol/L   Chloride 97 (L) 98 - 111 mmol/L   CO2 28 22 - 32 mmol/L   Glucose, Bld 162 (H) 70 - 99 mg/dL    Comment: Glucose reference range applies only to samples taken after fasting for at least 8 hours.   BUN <5 (L) 6 - 20 mg/dL   Creatinine, Ser 7.51 0.61 - 1.24 mg/dL   Calcium 9.1 8.9 - 02.5 mg/dL   Total Protein 7.8 6.5 - 8.1 g/dL   Albumin 4.4 3.5 - 5.0 g/dL   AST 852 (H) 15 - 41 U/L   ALT 122 (H) 0 - 44 U/L   Alkaline Phosphatase 135 (H) 38 - 126 U/L   Total Bilirubin 2.3 (H) 0.3 - 1.2 mg/dL   GFR, Estimated >77 >82 mL/min    Comment: (NOTE) Calculated using the CKD-EPI Creatinine Equation (2021)    Anion gap 17 (H) 5 - 15    Comment: Performed at [facility], [address] [place], [place] [address]  Ethanol     Status: Abnormal   Collection Time: 07/31/20 10:37 PM  Result Value Ref Range   Alcohol, Ethyl (B) 263 (H) <10 mg/dL    Comment: (NOTE) Lowest detectable limit for serum alcohol is 10 mg/dL.  For medical purposes only. Performed at [facility], [address] [place], [place] [address]   Salicylate level     Status: Abnormal   Collection Time: 07/31/20 10:37 PM  Result Value Ref Range   Salicylate Lvl <7.0 (L) 7.0 - 30.0 mg/dL    Comment: Performed at [facility],  [address] [place], [place] [address]  Acetaminophen level     Status: Abnormal   Collection Time: 07/31/20 10:37 PM  Result Value Ref Range   Acetaminophen (Tylenol), Serum <10 (L) 10 - 30 ug/mL    Comment: (NOTE) Therapeutic concentrations vary significantly. A range of 10-30 ug/mL  may be an effective concentration for many patients. However, some  are best treated at concentrations outside of this range. Acetaminophen concentrations >150 ug/mL at 4 hours after ingestion  and >50 ug/mL at 12 hours after ingestion are often associated with  toxic reactions.  Performed at [facility], [address] [place], [place] [address]   cbc     Status: Abnormal   Collection Time: 07/31/20 10:37 PM  Result Value Ref Range   WBC 7.0 4.0 - 10.5 K/uL   RBC 4.13 (L) 4.22 - 5.81 MIL/uL   Hemoglobin 14.4 13.0 - 17.0 g/dL   HCT 70.4 39 - 52 %   MCV 99.5 80.0 - 100.0 fL   MCH 34.9 (H) 26.0 - 34.0 pg   MCHC 35.0 30.0 - 36.0 g/dL   RDW 88.8 (H) 91.6 - 94.5 %   Platelets 78 (L) 150 - 400 K/uL    Comment: PLATELET COUNT CONFIRMED BY SMEAR Immature Platelet Fraction may be clinically indicated, consider ordering this additional test [id number]    nRBC 0.0 0.0 - 0.2 %    Comment: Performed at [facility], [address] [place], [place] [address]  Urine Drug Screen, Qualitative     Status: Abnormal   Collection Time: 07/31/20 10:37 PM   Result Value Ref Range   Tricyclic, Ur Screen NONE DETECTED NONE DETECTED   Amphetamines, Ur Screen NONE DETECTED NONE DETECTED   MDMA (Ecstasy)Ur Screen NONE DETECTED NONE DETECTED   Cocaine Metabolite,Ur [place] NONE DETECTED NONE DETECTED   Opiate, Ur Screen NONE DETECTED NONE DETECTED   Phencyclidine (PCP) Ur S NONE DETECTED NONE DETECTED   Cannabinoid 50 Ng, Ur [place] NONE DETECTED NONE DETECTED   Barbiturates, Ur Screen NONE DETECTED NONE DETECTED   Benzodiazepine, Ur Scrn POSITIVE (A) NONE DETECTED   Methadone Scn, Ur NONE DETECTED NONE DETECTED    Comment: (NOTE) Tricyclics + metabolites, urine    Cutoff 1000 ng/mL Amphetamines + metabolites, urine  Cutoff 1000 ng/mL MDMA (Ecstasy), urine              Cutoff 500 ng/mL Cocaine Metabolite, urine          Cutoff 300 ng/mL Opiate + metabolites, urine        Cutoff 300 ng/mL Phencyclidine (PCP), urine         Cutoff 25 ng/mL Cannabinoid, urine                 Cutoff 50 ng/mL Barbiturates + metabolites, urine  Cutoff 200 ng/mL Benzodiazepine, urine              Cutoff 200 ng/mL Methadone, urine                   Cutoff 300 ng/mL  The urine drug screen provides only a preliminary, unconfirmed analytical test result and should not be used for non-medical purposes. Clinical consideration and professional judgment should be applied to any positive drug screen result due to possible interfering substances. A more specific alternate chemical method must be used in order to obtain a confirmed analytical result. Gas chromatography / mass spectrometry (GC/MS) is the preferred confirm atory method. Performed at [facility], [address], [place], [place] [address]   Resp Panel by RT-PCR (Flu A&B, Covid) Nasopharyngeal Swab     Status: None   Collection Time: 08/01/20 12:34 AM   Specimen: Nasopharyngeal Swab; Nasopharyngeal(NP) swabs in vial transport medium  Result Value Ref Range   SARS Coronavirus 2 by RT PCR NEGATIVE NEGATIVE     Comment: (NOTE) SARS-CoV-2 target nucleic acids are NOT DETECTED.  The SARS-CoV-2 RNA is generally detectable in upper respiratory specimens during the acute phase of infection. The lowest concentration of SARS-CoV-2 viral copies this assay can detect is 138 copies/mL. A negative result does not preclude SARS-Cov-2 infection and should not be used as the sole basis for treatment or other patient management decisions. A negative result may occur with  improper specimen collection/handling, submission of specimen other than nasopharyngeal swab, presence of viral mutation(s) within the areas targeted by this assay, and inadequate number of viral copies(<138 copies/mL). A negative result must be combined with clinical observations, patient history, and epidemiological information. The expected result is Negative.  Fact Sheet for Patients:  BloggerCourse.com  Fact Sheet for Healthcare Providers:  SeriousBroker.it  This test is no t yet approved or cleared by the [place] FDA and  has been authorized for detection and/or diagnosis of SARS-CoV-2 by FDA under an Emergency Use Authorization (EUA). This EUA will remain  in effect (meaning this test can be used) for the duration of the COVID-19 declaration under Section 564(b)(1) of the Act, 21 U.S.C.section 360bbb-3(b)(1), unless the authorization is terminated  or revoked sooner.       Influenza A by PCR NEGATIVE NEGATIVE   Influenza B by PCR NEGATIVE NEGATIVE    Comment: (NOTE) The Xpert Xpress SARS-CoV-2/FLU/RSV plus assay is intended as an aid in the diagnosis of influenza from Nasopharyngeal swab specimens and should not be used as a sole basis for treatment. Nasal washings and aspirates are unacceptable for Xpert Xpress SARS-CoV-2/FLU/RSV testing.  Fact Sheet for Patients: BloggerCourse.com  Fact Sheet for Healthcare  Providers: SeriousBroker.it  This test is not yet approved or cleared by the [place] FDA and has been authorized for detection and/or diagnosis of SARS-CoV-2 by FDA under an Emergency Use Authorization (EUA). This EUA will remain in effect (meaning this test can be used) for the duration of the COVID-19 declaration under Section 564(b)(1) of the Act, 21 U.S.C. section 360bbb-3(b)(1), unless the authorization is terminated or revoked.  Performed at [facility], [address] [place], [place] [address]   Comprehensive metabolic panel     Status: Abnormal   Collection Time: 08/01/20  2:32 AM  Result Value Ref Range   Sodium 138 135 - 145 mmol/L   Potassium 3.2 (L) 3.5 - 5.1 mmol/L   Chloride 98 98 - 111 mmol/L   CO2 27 22 - 32 mmol/L   Glucose, Bld 289 (H) 70 - 99 mg/dL    Comment: Glucose reference range applies only to samples taken after fasting for at least 8 hours.   BUN <5 (L) 6 - 20 mg/dL   Creatinine, Ser 5.00 0.61 - 1.24 mg/dL   Calcium 8.5 (L) 8.9 - 10.3 mg/dL   Total Protein 7.3 6.5 - 8.1 g/dL   Albumin 3.8 3.5 - 5.0 g/dL   AST 938 (H) 15 - 41 U/L   ALT 108 (H) 0 - 44 U/L   Alkaline Phosphatase 116 38 - 126 U/L   Total Bilirubin 2.1 (H) 0.3 - 1.2 mg/dL   GFR,  Estimated >60 >60 mL/min    Comment: (NOTE) Calculated using the CKD-EPI Creatinine Equation (2021)    Anion gap 13 5 - 15    Comment: Performed at [facility], [address] [place], [place] [address]    Current Facility-Administered Medications  Medication Dose Route Frequency Provider Last Rate Last Admin  . cholecalciferol (VITAMIN D3) tablet 2,000 Units  2,000 Units Oral Daily Jaisha Villacres T, MD      . LORazepam (ATIVAN) injection 0-4 mg  0-4 mg Intravenous Q6H Loleta RoseForbach, Cory, MD       Or  . LORazepam (ATIVAN) tablet 0-4 mg  0-4 mg Oral Q6H Loleta RoseForbach, Cory, MD   1 mg at 08/01/20 0645  . [START ON 08/03/2020] LORazepam (ATIVAN) injection 0-4 mg  0-4  mg Intravenous Q12H Loleta RoseForbach, Cory, MD       Or  . Melene Muller[START ON 08/03/2020] LORazepam (ATIVAN) tablet 0-4 mg  0-4 mg Oral Q12H Loleta RoseForbach, Cory, MD      . ondansetron [facility](ZOFRAN) tablet 4 mg  4 mg Oral Q8H PRN Loleta RoseForbach, Cory, MD   4 mg at 08/01/20 0033  . pantoprazole (PROTONIX) EC tablet 40 mg  40 mg Oral Daily Lillie Portner, Jackquline DenmarkJohn T, MD       No current outpatient medications on file.    Musculoskeletal: Strength & Muscle Tone: within normal limits Gait & Station: normal Patient leans: N/A  Psychiatric Specialty Exam: Physical Exam Vitals and nursing note reviewed.  Constitutional:      Appearance: He is well-developed.  HENT:     Head: Normocephalic and atraumatic.  Eyes:     Conjunctiva/sclera: Conjunctivae normal.     Pupils: Pupils are equal, round, and reactive to light.  Cardiovascular:     Heart sounds: Normal heart sounds.  Pulmonary:     Effort: Pulmonary effort is normal.  Abdominal:     Palpations: Abdomen is soft.  Musculoskeletal:        General: Normal range of motion.     Cervical back: Normal range of motion.  Skin:    General: Skin is warm and dry.  Neurological:     General: No focal deficit present.     Mental Status: He is alert.  Psychiatric:        Attention and Perception: Perception normal.        Mood and Affect: Mood is depressed.        Speech: Speech normal.        Behavior: Behavior is cooperative.        Thought Content: Thought content is not paranoid. Thought content does not include homicidal or suicidal ideation.        Cognition and Memory: Cognition normal.        Judgment: Judgment normal.     Review of Systems  Constitutional: Negative.   HENT: Negative.   Eyes: Negative.   Respiratory: Negative.   Cardiovascular: Negative.   Gastrointestinal: Negative.   Musculoskeletal: Negative.   Skin: Negative.   Neurological: Negative.   Psychiatric/Behavioral: Positive for dysphoric mood and sleep disturbance. The patient is nervous/anxious.      Blood pressure 135/69, pulse 100, temperature 98.1 F (36.7 C), temperature source Oral, resp. rate 20, height 5\' 8"  (1.727 m), weight 105.7 kg, SpO2 96 %.Body mass index is 35.43 kg/m.  General Appearance: Casual  Eye Contact:  Fair  Speech:  Clear and Coherent  Volume:  Decreased  Mood:  Dysphoric  Affect:  Congruent  Thought Process:  Goal Directed  Orientation:  Full (Time, Place, and Person)  Thought Content:  Logical  Suicidal Thoughts:  No  Homicidal Thoughts:  No  Memory:  Immediate;   Fair Recent;   Fair Remote;   Fair  Judgement:  Fair  Insight:  Fair  Psychomotor Activity:  Decreased  Concentration:  Concentration: Fair  Recall:  [employer] of Knowledge:  Fair  Language:  Fair  Akathisia:  No  Handed:  Right  AIMS (if indicated):     Assets:  Desire for Improvement Housing Resilience  ADL's:  Impaired  Cognition:  WNL  Sleep:        Treatment Plan Summary: Daily contact with patient to assess and evaluate symptoms and progress in treatment, Medication management and Plan Patient with alcohol abuse and withdrawal.  Not delirious.  No seizures.  Fairly stable medically right now.  Patient is requesting help with alcohol abuse and is motivated for improvement.  Multiple symptoms of mild to moderate chronic depression and anxiety but no sign of acute dangerousness.  Does not require IVC.  Ideal disposition I believe would be residential treatment services or [facility] or some other substance abuse facility rather than inpatient psychiatry.  Orders placed for Prilosec which he takes at home and vitamin D which she takes at home.  CIWA protocol in place.  Case reviewed with ER physician and TTS.  Disposition: See note above.  Recommend substance abuse treatment  Mordecai Rasmussen, MD 08/01/2020 10:52 AM

## 2020-08-01 NOTE — ED Notes (Signed)
Hourly rounding completed at this time, patient currently asleep in hallway bed. No complaints, stable, and in no acute distress. Q15 minute rounds and monitoring via Rover and Officer to continue. 

## 2020-08-01 NOTE — ED Provider Notes (Signed)
[facility] Emergency Department Provider Note  ____________________________________________   First MD Initiated Contact with Patient 07/31/20 2317     (approximate)  I have reviewed the triage vital signs and the nursing notes.   HISTORY  Chief Complaint Weakness  Level 5 caveat:  history/ROS limited by acute intoxication.  HPI Chad Walsh is a 39 y.o. male with no significant past medical history other than alcohol use who presents for evaluation of not feeling well.  He cannot provide any specific symptoms except he is depressed because his mother passed away 2 weeks ago and he has been drinking very heavily for the last couple of days.  He said he has had passing thoughts of suicide but he does not think he would never do it.  Nothing in particular makes him feel better or worse and his symptoms have been gradually worsening over time and it becomes severe.  He is tearful while we are talking.  He admits to drinking heavily today but has not had anything to drink for about 7 hours.  He is tremulous.  Denies chest pain, shortness of breath, abdominal pain, dysuria.        Past Medical History:  Diagnosis Date  . ETOH abuse     There are no problems to display for this patient.   History reviewed. No pertinent surgical history.  Prior to Admission medications   Not on File    Allergies Patient has no known allergies.  History reviewed. No pertinent family history.  Social History Social History   Tobacco Use  . Smoking status: Never Smoker  . Smokeless tobacco: Never Used  Vaping Use  . Vaping Use: Never used  Substance Use Topics  . Alcohol use: Yes    Comment: as of 07/31/20 last drink was 4pm. Pt states does not drink everyday   . Drug use: Never    Review of Systems Level 5 caveat:  history/ROS limited by acute intoxication  Constitutional: Does not feel well. No fever/chills Eyes: No visual changes. ENT: No sore  throat. Cardiovascular: Denies chest pain. Respiratory: Denies shortness of breath. Gastrointestinal: No abdominal pain.  No nausea, no vomiting.  No diarrhea.  No constipation. Genitourinary: Negative for dysuria. Musculoskeletal: Negative for neck pain.  Negative for back pain. Integumentary: Negative for rash. Neurological: Negative for headaches, focal weakness or numbness. Psych: Depressed, heavy drinking, passing thoughts of suicide.   ____________________________________________   PHYSICAL EXAM:  VITAL SIGNS: ED Triage Vitals  Enc Vitals Group     BP 07/31/20 2225 (!) 155/107     Pulse Rate 07/31/20 2225 (!) 104     Resp 07/31/20 2225 16     Temp 07/31/20 2225 98.1 F (36.7 C)     Temp src --      SpO2 07/31/20 2216 96 %     Weight 07/31/20 2226 105.7 kg (233 lb)     Height 07/31/20 2226 1.727 m (5\' 8" )     Head Circumference --      Peak Flow --      Pain Score 07/31/20 2226 0     Pain Loc --      Pain Edu? --      Excl. in GC? --     Constitutional: Alert and oriented but clearly intoxicated. Eyes: Conjunctivae are normal.  Head: Atraumatic. Nose: No congestion/rhinnorhea. Mouth/Throat: Patient is wearing a mask. Neck: No stridor.  No meningeal signs.   Cardiovascular: Borderline tachycardia, regular rhythm. Good peripheral  circulation. Grossly normal heart sounds. Respiratory: Normal respiratory effort.  No retractions. Gastrointestinal: Soft and nontender. No distention.  Musculoskeletal: No lower extremity tenderness nor edema. No gross deformities of extremities. Neurologic: Mild resting tremor.  Normal speech and language. No gross focal neurologic deficits are appreciated.  Skin:  Skin is warm, dry and intact. Psychiatric: Mood and affect are depressed, tearful, admits to passing suicidal thoughts but does not think he would do it.  ____________________________________________   LABS (all labs ordered are listed, but only abnormal results are  displayed)  Labs Reviewed  COMPREHENSIVE METABOLIC PANEL - Abnormal; Notable for the following components:      Result Value   Potassium 3.2 (*)    Chloride 97 (*)    Glucose, Bld 162 (*)    BUN <5 (*)    AST 340 (*)    ALT 122 (*)    Alkaline Phosphatase 135 (*)    Total Bilirubin 2.3 (*)    Anion gap 17 (*)    All other components within normal limits  ETHANOL - Abnormal; Notable for the following components:   Alcohol, Ethyl (B) 263 (*)    All other components within normal limits  SALICYLATE LEVEL - Abnormal; Notable for the following components:   Salicylate Lvl <7.0 (*)    All other components within normal limits  ACETAMINOPHEN LEVEL - Abnormal; Notable for the following components:   Acetaminophen (Tylenol), Serum <10 (*)    All other components within normal limits  CBC - Abnormal; Notable for the following components:   RBC 4.13 (*)    MCH 34.9 (*)    RDW 16.9 (*)    Platelets 78 (*)    All other components within normal limits  URINE DRUG SCREEN, QUALITATIVE ([facility] ONLY) - Abnormal; Notable for the following components:   Benzodiazepine, Ur Scrn POSITIVE (*)    All other components within normal limits  COMPREHENSIVE METABOLIC PANEL - Abnormal; Notable for the following components:   Potassium 3.2 (*)    Glucose, Bld 289 (*)    BUN <5 (*)    Calcium 8.5 (*)    AST 302 (*)    ALT 108 (*)    Total Bilirubin 2.1 (*)    All other components within normal limits  RESP PANEL BY RT-PCR (FLU A&B, COVID) [id number]   ____________________________________________  EKG  No indication for emergent EKG ____________________________________________  RADIOLOGY I, Loleta Rose, personally viewed and evaluated these images (plain radiographs) as part of my medical decision making, as well as reviewing the written report by the radiologist.  ED MD interpretation: Patient has some gallstones but without any evidence of ductal dilatation and negative Murphy sign.  Official  radiology report(s): US ABDOMEN LIMITED RUQ (LIVER/GB)  Result Date: 08/01/2020 CLINICAL DATA:  Transaminitis EXAM: ULTRASOUND ABDOMEN LIMITED RIGHT UPPER QUADRANT COMPARISON:  None. FINDINGS: Gallbladder: There is gallbladder sludge gallstones. There is gallbladder wall thickening with the gallbladder wall measuring approximately 5 mm. The sonographic Eulah Pont sign is negative. Common bile duct: Diameter: 4 mm Liver: Diffuse increased echogenicity with slightly heterogeneous liver. Appearance typically secondary to fatty infiltration. Fibrosis secondary consideration. No secondary findings of cirrhosis noted. No focal hepatic lesion or intrahepatic biliary duct dilatation. Portal vein is patent on color Doppler imaging with normal direction of blood flow towards the liver. Other: None. IMPRESSION: 1. Findings are equivocal for acute calculus cholecystitis. There are gallstones with gallbladder wall thickening, however the sonographic Murphy sign is negative. If there is high clinical suspicious  for acute cholecystitis, follow-up with HIDA scan is recommended. 2. Hepatic steatosis. Electronically Signed   By: Katherine Mantle M.D.   On: 08/01/2020 01:43    ____________________________________________   PROCEDURES   Procedure(s) performed (including Critical Care):  Procedures   ____________________________________________   INITIAL IMPRESSION / MDM / ASSESSMENT AND PLAN / ED COURSE  As part of my medical decision making, I reviewed the following data within the electronic MEDICAL RECORD NUMBER Nursing notes reviewed and incorporated, Labs reviewed , Old chart reviewed, A consult was requested and obtained from this/these consultant(s) Psychiatry and Notes from prior ED visits   Differential diagnosis includes, but is not limited to, alcohol withdrawal, depression, substance-induced mood disorder, depression, hepatitis, biliary disease, cirrhosis, alcoholic ketoacidosis.  The patient appears to  be withdrawing slightly in spite of an elevated ethanol level.  His labs are also notable for global transaminitis.  However he has no tenderness to palpation of the abdomen.  I will proceed with an ultrasound of the right upper quadrant to make sure there is no sign of cholecystitis but I think more likely his transaminitis is due to his alcohol abuse.  I am putting him on CIWA and giving him 2 mg of Ativan now.  I ordered thiamine 100 mg by mouth.  After further consideration I asked the nurse to put an IV and give him a 1 L bolus of D5 normal saline given that I suspect even though he is not a full-blown alcoholic ketoacidosis he may be on the borderline.  I will recheck a metabolic panel to see if his transaminitis persists and if his elevated anion gap has closed after a liter bolus.  After he is medically cleared I anticipate psychiatry consultation.       Clinical Course as of Aug 02 819  Wed Aug 01, 2020  0149 SARS Coronavirus 2 by RT PCR: NEGATIVE [CF]  0152 Equivocal ultrasound, radiologist cannot exclude acute cholecystitis.  Again the patient has no tenderness palpation but has significant transaminitis included an elevated total bilirubin.  We will consider additional advanced imaging such as HIDA scan or MRCP but this can likely be pursued as an outpatient given that the patient has no nausea, vomiting, nor abdominal pain at this time.  US ABDOMEN LIMITED RUQ (LIVER/GB) [CF]  0700 The patient's repeat comprehensive metabolic panel improved significantly.  His LFTs are coming down, his anion gap closed, and I am reassured that most of his issue seems to be related to his alcohol use.  He does not meet medical admission criteria and he will likely need outpatient follow-up with GI for his transaminitis and perhaps with surgery regarding his gallstones, but there is no indication that he needs acute intervention at this time, particularly given his lack of abdominal pain or other  symptoms.  Psychiatry consult is pending. The patient has been placed in psychiatric observation due to the need to provide a safe environment for the patient while obtaining psychiatric consultation and evaluation, as well as ongoing medical and medication management to treat the patient's condition. The patient has not been placed under full IVC at this time.   [CF]    Clinical Course User Index [CF] Loleta Rose, MD     ____________________________________________  FINAL CLINICAL IMPRESSION(S) / ED DIAGNOSES  Final diagnoses:  Alcohol abuse  Alcohol withdrawal syndrome with complication (HCC)  Depression, unspecified depression type  Transaminitis  Gallstones     MEDICATIONS GIVEN DURING THIS VISIT:  Medications  LORazepam (ATIVAN)  injection 0-4 mg ( Intravenous See Alternative 08/01/20 0645)    Or  LORazepam (ATIVAN) tablet 0-4 mg (1 mg Oral Given 08/01/20 0645)  LORazepam (ATIVAN) injection 0-4 mg (has no administration in time range)    Or  LORazepam (ATIVAN) tablet 0-4 mg (has no administration in time range)  ondansetron (ZOFRAN) tablet 4 mg (4 mg Oral Given 08/01/20 0033)  thiamine tablet 100 mg (100 mg Oral Given 08/01/20 0033)  dextrose 5 %-0.9 % sodium chloride infusion (0 mLs Intravenous Stopped 08/01/20 0229)  ibuprofen (ADVIL) tablet 600 mg (600 mg Oral Given 08/01/20 0345)     ED Discharge Orders    None      *Please note:  Krystal ClarkMarcus J Doster was evaluated in Emergency Department on 08/01/2020 for the symptoms described in the history of present illness. He was evaluated in the context of the global COVID-19 pandemic, which necessitated consideration that the patient might be at risk for infection with the SARS-CoV-2 virus that causes COVID-19. Institutional protocols and algorithms that pertain to the evaluation of patients at risk for COVID-19 are in a state of rapid change based on information released by regulatory bodies including the CDC and federal and  state organizations. These policies and algorithms were followed during the patient's care in the ED.  Some ED evaluations and interventions may be delayed as a result of limited staffing during and after the pandemic.*  Note:  This document was prepared using Dragon voice recognition software and may include unintentional dictation errors.   Loleta RoseForbach, Jesson Foskey, MD 08/01/20 [phone]

## 2020-08-01 NOTE — ED Notes (Addendum)
Complains of hip pain and requests ibuprofen

## 2020-08-01 NOTE — ED Notes (Signed)
Pt awake at this time, stands up, asks this nurse for ibuprofen and then goes to rest room. MD contacted with request.

## 2020-08-01 NOTE — ED Notes (Signed)
Report received from Annie, RN including situation, background, assessment and recommendations. Patient sleeping, respirations regular and unlabored. Q15 minute rounds and security camera observation to continue. Will assess patient once awake. 

## 2020-08-01 NOTE — ED Notes (Signed)
Hourly rounding completed at this time, patient currently awake in hallway bed speaking to TTS. No complaints, stable, and in no acute distress. Q15 minute rounds and monitoring via Psychologist, counselling to continue.

## 2020-08-01 NOTE — ED Notes (Signed)
Hourly rounding completed at this time, patient currently awake in hallway bed. No complaints, stable, and in no acute distress. Q15 minute rounds and monitoring via Rover and Officer to continue. 

## 2020-08-01 NOTE — ED Notes (Signed)
Psychiatrist at bedside

## 2020-08-01 NOTE — ED Notes (Signed)
VOLUNTARY/pending placement 

## 2020-08-01 NOTE — BH Assessment (Signed)
Referral information for Substance Abuse Treatment faxed to:   [facility] [phone] or [phone])  . [facility] [phone])  . [facility] [phone])  . [facility] [phone])  . [facility] [phone] -or- [phone]),   . [facility] [phone])  . [facility] [phone])

## 2020-08-01 NOTE — BH Assessment (Signed)
Assessment Note  Chad Walsh is an 39 y.o. male who presents to the ER due to his alcohol use and his current mental state. Patient reports his mother passed approximately two weeks ago and he's having a difficult time with it. He use to drink only on the weekends, but now that he's mother passed, he's drinking daily. It's in the amount of twelve sixteen-ounce beers. He further reports, he had to take a leave from work because of the grief and alcohol use. Patient leaved alone with his mother. It's unclear if he was the primary caregiver. He has a sister he's close to.  During interview the patient was calm, cooperative, and pleasant. He was able to provide appropriate answers to the question.  Throughout the interview the patient denied HI and AV/H. He denies the use of any other mind-altering substances. He also denies involvement with the legal system.  Diagnosis: Depression  Past Medical History:  Past Medical History:  Diagnosis Date  . ETOH abuse     History reviewed. No pertinent surgical history.  Family History: History reviewed. No pertinent family history.  Social History:  reports that he has never smoked. He has never used smokeless tobacco. He reports current alcohol use. He reports that he does not use drugs.  Additional Social History:  Alcohol / Drug Use Pain Medications: See PTA Prescriptions: See PTA Over the Counter: See PTA History of alcohol / drug use?: Yes Longest period of sobriety (when/how long): Unable to quantify Substance #1 Name of Substance 1: Alcohol 1 - Last Use / Amount: 08/01/2020  CIWA: CIWA-Ar BP: (!) 139/96 Pulse Rate: (!) 109 Nausea and Vomiting: no nausea and no vomiting (pt up to restroom with steady gait. pt talkative and alert. no distress noted. ) Tactile Disturbances: none Tremor: no tremor Auditory Disturbances: mild harshness or ability to frighten Paroxysmal Sweats: no sweat visible Visual Disturbances: not  present Anxiety: mildly anxious Headache, Fullness in Head: none present Agitation: two Orientation and Clouding of Sensorium: oriented and can do serial additions CIWA-Ar Total: 5 COWS:    Allergies: No Known Allergies  Home Medications: (Not in a hospital admission)   OB/GYN Status:  No LMP for male patient.  General Assessment Data Location of Assessment: [facility] ED TTS Assessment: In system Is this a Tele or Face-to-Face Assessment?: Face-to-Face Is this an Initial Assessment or a Re-assessment for this encounter?: Initial Assessment Patient Accompanied by:: N/A Language Other than English: No Living Arrangements: Other (Comment) (Private Home) What gender do you identify as?: Male Date Telepsych consult ordered in [facility]: 08/01/20 Time Telepsych consult ordered in [facility]: 0407 Marital status: Single Pregnancy Status: No Living Arrangements: Alone Can pt return to current living arrangement?: No Admission Status: Voluntary Is patient capable of signing voluntary admission?: Yes Referral Source: Self/Family/Friend Insurance type: None  Medical Screening Exam [facility] Walk-in ONLY) Medical Exam completed: Yes  Crisis Care Plan Living Arrangements: Alone Legal Guardian: Other: (Self) Name of Psychiatrist: Reports of none Name of Therapist: Reports of none  Education Status Is patient currently in school?: No Is the patient employed, unemployed or receiving disability?: Employed  Risk to self with the past 6 months Suicidal Ideation: No Has patient been a risk to self within the past 6 months prior to admission? : No Suicidal Intent: No Has patient had any suicidal intent within the past 6 months prior to admission? : No Is patient at risk for suicide?: No Suicidal Plan?: No Has patient had any suicidal plan within  the past 6 months prior to admission? : No Access to Means: No What has been your use of drugs/alcohol within the last 12 months?: Alcohol Previous  Attempts/Gestures: No How many times?: 0 Other Self Harm Risks: Active Alcohol Use Triggers for Past Attempts: None known Intentional Self Injurious Behavior: None Family Suicide History: Unknown Recent stressful life event(s): Other (Comment), Conflict (Comment) Persecutory voices/beliefs?: No Depression: No Depression Symptoms: Guilt, Isolating Substance abuse history and/or treatment for substance abuse?: Yes Suicide prevention information given to non-admitted patients: Not applicable  Risk to Others within the past 6 months Homicidal Ideation: No Does patient have any lifetime risk of violence toward others beyond the six months prior to admission? : No Thoughts of Harm to Others: No Current Homicidal Intent: No Current Homicidal Plan: No Access to Homicidal Means: No Identified Victim: Reports of none History of harm to others?: No Assessment of Violence: None Noted Violent Behavior Description: Reports of none Does patient have access to weapons?: No Criminal Charges Pending?: No Does patient have a court date: No Is patient on probation?: No  Psychosis Hallucinations: None noted Delusions: None noted  Mental Status Report Appearance/Hygiene: Unremarkable, In scrubs Eye Contact: Fair Motor Activity: Freedom of movement, Unremarkable Speech: Logical/coherent, Unremarkable Level of Consciousness: Alert Mood: Depressed, Sad, Pleasant Affect: Appropriate to circumstance, Sad Anxiety Level: Minimal Thought Processes: Coherent, Relevant Judgement: Unimpaired Orientation: Person, Place, Time, Situation, Appropriate for developmental age Obsessive Compulsive Thoughts/Behaviors: None  Cognitive Functioning Concentration: Normal Memory: Recent Intact, Remote Intact Is patient IDD: No Insight: Fair Impulse Control: Fair Appetite: Fair Have you had any weight changes? : No Change Sleep: No Change Total Hours of Sleep: 8 Vegetative Symptoms: None  ADLScreening  [facility] Assessment Services) Patient's cognitive ability adequate to safely complete daily activities?: Yes Patient able to express need for assistance with ADLs?: Yes Independently performs ADLs?: Yes (appropriate for developmental age)  Prior Inpatient Therapy Prior Inpatient Therapy: No  Prior Outpatient Therapy Prior Outpatient Therapy: No Does patient have an ACCT team?: No Does patient have Intensive In-House Services?  : No Does patient have [facility] services? : No Does patient have P4CC services?: No  ADL Screening (condition at time of admission) Patient's cognitive ability adequate to safely complete daily activities?: Yes Is the patient deaf or have difficulty hearing?: No Does the patient have difficulty seeing, even when wearing glasses/contacts?: No Does the patient have difficulty concentrating, remembering, or making decisions?: No Patient able to express need for assistance with ADLs?: Yes Does the patient have difficulty dressing or bathing?: No Independently performs ADLs?: Yes (appropriate for developmental age) Does the patient have difficulty walking or climbing stairs?: No Weakness of Legs: None Weakness of Arms/Hands: None  Home Assistive Devices/Equipment Home Assistive Devices/Equipment: None  Therapy Consults (therapy consults require a physician order) PT Evaluation Needed: No OT Evalulation Needed: No SLP Evaluation Needed: No Abuse/Neglect Assessment (Assessment to be complete while patient is alone) Abuse/Neglect Assessment Can Be Completed: Yes Physical Abuse: Denies Verbal Abuse: Denies Sexual Abuse: Denies Exploitation of patient/patient's resources: Denies Self-Neglect: Denies Values / Beliefs Cultural Requests During Hospitalization: None Spiritual Requests During Hospitalization: None Consults Spiritual Care Consult Needed: No Transition of Care Team Consult Needed: No Advance Directives (For Healthcare) Does Patient Have a Medical  Advance Directive?: No Would patient like information on creating a medical advance directive?: No - Patient declined  Disposition:  Disposition Initial Assessment Completed for this Encounter: Yes  On Site Evaluation by:   Reviewed with Physician:  Lilyan Gilford MS, LCAS, [facility], [facility] Therapeutic Triage Specialist 08/01/2020 6:17 AM

## 2020-08-01 NOTE — ED Notes (Signed)
Pt to US at this time.

## 2020-08-01 NOTE — ED Notes (Signed)
Pt ambulatory to restroom with steady gait. PT denies any needs at this time.

## 2020-08-01 NOTE — ED Notes (Signed)
PT resting with snoring respirations. Environment secure.

## 2020-08-01 NOTE — ED Notes (Signed)
Hourly rounding completed at this time, patient currently awake in restroom. No complaints, stable, and in no acute distress. Q15 minute rounds and monitoring via Rover and Officer to continue. 

## 2020-08-01 NOTE — ED Notes (Signed)
Pt now tearful in hallway crying and speaking to officer who is present in [place].

## 2020-08-01 NOTE — ED Notes (Signed)
Hourly rounding completed at this time, patient currently awake in hallway bed, provided with snack. No complaints, stable, and in no acute distress. Q15 minute rounds and monitoring via Psychologist, counselling to continue.

## 2020-08-01 NOTE — ED Notes (Signed)
Pt back from US now.

## 2020-08-01 NOTE — BH Assessment (Signed)
Referral information for Substance Abuse Treatment faxed to:   [facility] [phone] or [phone]) No Answer  . [place] [phone]) Per Fritzi Mandes, pt is denied due to having no insurance. Pt does have option to choose a self pay rate.   [facility] [phone]) Megan requested a refax. Task completed at 10:16 PM.   . [facility] [phone]) Per Glo Herring, there are no appropriate beds at this time.   [facility] [phone] -or- [phone]), Per Elmarie Shiley, staff is currently reviewing pts and will reach back out ASAP.   [facility] [phone]) Per Donivan Scull, the admission team has left for today. Advised TTS to call back at [facility] 08/02/20.  [facility] [phone])

## 2020-08-02 NOTE — ED Provider Notes (Addendum)
-----------------------------------------   9:26 AM on 08/02/2020 -----------------------------------------  The patient has been accepted to [facility].  I have discharged him from the ED and he will be transported there directly.  He is stable for discharge at this time.  He continues to have no abdominal pain and no clinical evidence of cholecystitis.  I counseled the patient on return precautions and follow-up plans and he expressed understanding.    Dionne Bucy, MD 08/02/20 0930

## 2020-08-02 NOTE — ED Notes (Signed)
Hourly rounding completed at this time, patient currently asleep in hallway bed. No complaints, stable, and in no acute distress. Q15 minute rounds and monitoring via Rover and Officer to continue. 

## 2020-08-02 NOTE — ED Notes (Signed)
Assumed care of patient, patient up early denied SI/HI/SI. Patient reports depression due to loss of his mother 2 weeks ago, reports has been drinking a lot, when asked when was last drink patient states 3 days ago, no tremors/ sweat or hallucination noted. Safety maintained will monitor.

## 2020-08-02 NOTE — ED Notes (Signed)
Pt asleep at this time, unable to collect vitals. Will collect pt vitals once awake. 

## 2020-08-02 NOTE — ED Notes (Signed)
Breakfast provided. Patient safe care escort here to pick up patient. Clothing returned to patient. patientt discharged to safe transport for transfer to [facility]

## 2020-09-16 ENCOUNTER — Encounter
Admission: EM | Disposition: A | Discharge status: Home / Self Care | Payer: Self-pay | Source: Home / Self Care | Attending: Internal Medicine

## 2020-09-16 ENCOUNTER — Inpatient Hospital Stay
Admission: EM | Admit: 2020-09-16 | Discharge: 2020-09-20 | DRG: 562 | Disposition: A | Discharge status: Home / Self Care | Payer: Self-pay | Attending: Internal Medicine | Admitting: Internal Medicine

## 2020-09-16 ENCOUNTER — Encounter: Payer: Self-pay | Admitting: Emergency Medicine

## 2020-09-16 ENCOUNTER — Inpatient Hospital Stay: Payer: Self-pay

## 2020-09-16 ENCOUNTER — Emergency Department: Payer: Self-pay

## 2020-09-16 ENCOUNTER — Other Ambulatory Visit: Payer: Self-pay

## 2020-09-16 DIAGNOSIS — D6959 Other secondary thrombocytopenia: Secondary | ICD-10-CM | POA: Diagnosis present

## 2020-09-16 DIAGNOSIS — S065X9A Traumatic subdural hemorrhage with loss of consciousness of unspecified duration, initial encounter: Secondary | ICD-10-CM

## 2020-09-16 DIAGNOSIS — Y92009 Unspecified place in unspecified non-institutional (private) residence as the place of occurrence of the external cause: Secondary | ICD-10-CM

## 2020-09-16 DIAGNOSIS — S0990XA Unspecified injury of head, initial encounter: Secondary | ICD-10-CM

## 2020-09-16 DIAGNOSIS — Y929 Unspecified place or not applicable: Secondary | ICD-10-CM

## 2020-09-16 DIAGNOSIS — Y906 Blood alcohol level of 120-199 mg/100 ml: Secondary | ICD-10-CM | POA: Diagnosis present

## 2020-09-16 DIAGNOSIS — F10229 Alcohol dependence with intoxication, unspecified: Secondary | ICD-10-CM | POA: Diagnosis present

## 2020-09-16 DIAGNOSIS — S065X0D Traumatic subdural hemorrhage without loss of consciousness, subsequent encounter: Secondary | ICD-10-CM

## 2020-09-16 DIAGNOSIS — S065XAA Traumatic subdural hemorrhage with loss of consciousness status unknown, initial encounter: Secondary | ICD-10-CM | POA: Diagnosis present

## 2020-09-16 DIAGNOSIS — S82892A Other fracture of left lower leg, initial encounter for closed fracture: Secondary | ICD-10-CM | POA: Diagnosis present

## 2020-09-16 DIAGNOSIS — Z23 Encounter for immunization: Secondary | ICD-10-CM

## 2020-09-16 DIAGNOSIS — F32A Depression, unspecified: Secondary | ICD-10-CM | POA: Diagnosis present

## 2020-09-16 DIAGNOSIS — S065X0A Traumatic subdural hemorrhage without loss of consciousness, initial encounter: Secondary | ICD-10-CM | POA: Diagnosis present

## 2020-09-16 DIAGNOSIS — S82892P Other fracture of left lower leg, subsequent encounter for closed fracture with malunion: Secondary | ICD-10-CM

## 2020-09-16 DIAGNOSIS — Z20822 Contact with and (suspected) exposure to covid-19: Secondary | ICD-10-CM | POA: Diagnosis present

## 2020-09-16 DIAGNOSIS — Z560 Unemployment, unspecified: Secondary | ICD-10-CM

## 2020-09-16 DIAGNOSIS — W000XXA Fall on same level due to ice and snow, initial encounter: Secondary | ICD-10-CM | POA: Diagnosis present

## 2020-09-16 DIAGNOSIS — E559 Vitamin D deficiency, unspecified: Secondary | ICD-10-CM | POA: Diagnosis present

## 2020-09-16 DIAGNOSIS — F1010 Alcohol abuse, uncomplicated: Secondary | ICD-10-CM | POA: Diagnosis present

## 2020-09-16 DIAGNOSIS — D696 Thrombocytopenia, unspecified: Secondary | ICD-10-CM | POA: Diagnosis present

## 2020-09-16 DIAGNOSIS — F10239 Alcohol dependence with withdrawal, unspecified: Secondary | ICD-10-CM | POA: Diagnosis present

## 2020-09-16 DIAGNOSIS — E669 Obesity, unspecified: Secondary | ICD-10-CM | POA: Diagnosis present

## 2020-09-16 DIAGNOSIS — E876 Hypokalemia: Secondary | ICD-10-CM | POA: Diagnosis present

## 2020-09-16 DIAGNOSIS — S9305XA Dislocation of left ankle joint, initial encounter: Secondary | ICD-10-CM | POA: Diagnosis present

## 2020-09-16 DIAGNOSIS — Z6832 Body mass index (BMI) 32.0-32.9, adult: Secondary | ICD-10-CM

## 2020-09-16 DIAGNOSIS — D539 Nutritional anemia, unspecified: Secondary | ICD-10-CM | POA: Diagnosis present

## 2020-09-16 DIAGNOSIS — R7401 Elevation of levels of liver transaminase levels: Secondary | ICD-10-CM | POA: Diagnosis present

## 2020-09-16 DIAGNOSIS — S82842A Displaced bimalleolar fracture of left lower leg, initial encounter for closed fracture: Principal | ICD-10-CM | POA: Diagnosis present

## 2020-09-16 LAB — CBC WITH DIFFERENTIAL/PLATELET
Abs Immature Granulocytes: 0.03 10*3/uL (ref 0.00–0.07)
Basophils Absolute: 0.1 10*3/uL (ref 0.0–0.1)
Basophils Relative: 1 %
Eosinophils Absolute: 0 10*3/uL (ref 0.0–0.5)
Eosinophils Relative: 0 %
HCT: 29.5 % — ABNORMAL LOW (ref 39.0–52.0)
Hemoglobin: 10.2 g/dL — ABNORMAL LOW (ref 13.0–17.0)
Immature Granulocytes: 0 %
Lymphocytes Relative: 15 %
Lymphs Abs: 1.5 10*3/uL (ref 0.7–4.0)
MCH: 33.9 pg (ref 26.0–34.0)
MCHC: 34.6 g/dL (ref 30.0–36.0)
MCV: 98 fL (ref 80.0–100.0)
Monocytes Absolute: 0.9 10*3/uL (ref 0.1–1.0)
Monocytes Relative: 9 %
Neutro Abs: 7.4 10*3/uL (ref 1.7–7.7)
Neutrophils Relative %: 75 %
Platelets: 106 10*3/uL — ABNORMAL LOW (ref 150–400)
RBC: 3.01 MIL/uL — ABNORMAL LOW (ref 4.22–5.81)
RDW: 17.8 % — ABNORMAL HIGH (ref 11.5–15.5)
WBC: 9.9 10*3/uL (ref 4.0–10.5)
nRBC: 0 % (ref 0.0–0.2)

## 2020-09-16 LAB — ETHANOL: Alcohol, Ethyl (B): 137 mg/dL — ABNORMAL HIGH (ref <10)

## 2020-09-16 LAB — COMPREHENSIVE METABOLIC PANEL
ALT: 99 U/L — ABNORMAL HIGH (ref 0–44)
AST: 256 U/L — ABNORMAL HIGH (ref 15–41)
Albumin: 3.3 g/dL — ABNORMAL LOW (ref 3.5–5.0)
Alkaline Phosphatase: 123 U/L (ref 38–126)
Anion gap: 14 (ref 5–15)
BUN: <5 mg/dL — ABNORMAL LOW (ref 6–20)
CO2: 28 mmol/L (ref 22–32)
Calcium: 7.8 mg/dL — ABNORMAL LOW (ref 8.9–10.3)
Chloride: 97 mmol/L — ABNORMAL LOW (ref 98–111)
Creatinine, Ser: 0.5 mg/dL — ABNORMAL LOW (ref 0.61–1.24)
GFR, Estimated: >60 mL/min (ref >60)
Glucose, Bld: 110 mg/dL — ABNORMAL HIGH (ref 70–99)
Potassium: 2.7 mmol/L — CL (ref 3.5–5.1)
Sodium: 139 mmol/L (ref 135–145)
Total Bilirubin: 4 mg/dL — ABNORMAL HIGH (ref 0.3–1.2)
Total Protein: 6.5 g/dL (ref 6.5–8.1)

## 2020-09-16 LAB — SARS CORONAVIRUS 2 BY RT PCR (HOSPITAL ORDER, PERFORMED IN ~~LOC~~ HOSPITAL LAB): SARS Coronavirus 2: NEGATIVE

## 2020-09-16 LAB — MAGNESIUM: Magnesium: 1.2 mg/dL — ABNORMAL LOW (ref 1.7–2.4)

## 2020-09-16 SURGERY — EXTERNAL FIXATION, LOWER EXTREMITY
Anesthesia: Choice | Laterality: Left

## 2020-09-16 MED ORDER — CLONIDINE HCL 0.1 MG PO TABS
0.0500 mg | ORAL_TABLET | Freq: Every day | ORAL | Status: DC
  Administered 2020-09-16 – 2020-09-20 (×5): 0.05 mg via ORAL
  Filled 2020-09-16 (×7): qty 1

## 2020-09-16 MED ORDER — SODIUM CHLORIDE 0.9 % IV BOLUS (SEPSIS)
1000.0000 mL | Freq: Once | INTRAVENOUS | Status: AC
  Administered 2020-09-16: 1000 mL via INTRAVENOUS

## 2020-09-16 MED ORDER — SODIUM CHLORIDE 0.9% FLUSH
3.0000 mL | Freq: Two times a day (BID) | INTRAVENOUS | Status: DC
  Administered 2020-09-16 – 2020-09-20 (×8): 3 mL via INTRAVENOUS

## 2020-09-16 MED ORDER — THIAMINE HCL 100 MG PO TABS
100.0000 mg | ORAL_TABLET | Freq: Every day | ORAL | Status: DC
  Administered 2020-09-17 – 2020-09-20 (×4): 100 mg via ORAL
  Filled 2020-09-16 (×6): qty 1

## 2020-09-16 MED ORDER — TETANUS-DIPHTH-ACELL PERTUSSIS 5-2.5-18.5 LF-MCG/0.5 IM SUSY
0.5000 mL | PREFILLED_SYRINGE | Freq: Once | INTRAMUSCULAR | Status: AC
  Administered 2020-09-16: 0.5 mL via INTRAMUSCULAR
  Filled 2020-09-16: qty 0.5

## 2020-09-16 MED ORDER — CEFAZOLIN SODIUM-DEXTROSE 2-4 GM/100ML-% IV SOLN
2.0000 g | Freq: Once | INTRAVENOUS | Status: DC
  Filled 2020-09-16: qty 100

## 2020-09-16 MED ORDER — LORAZEPAM 2 MG/ML IJ SOLN
0.0000 mg | Freq: Four times a day (QID) | INTRAMUSCULAR | Status: AC
  Administered 2020-09-16: 1 mg via INTRAVENOUS

## 2020-09-16 MED ORDER — PROPOFOL 10 MG/ML IV BOLUS
1.0000 mg/kg | Freq: Once | INTRAVENOUS | Status: AC
  Administered 2020-09-16: 94.3 mg via INTRAVENOUS
  Filled 2020-09-16: qty 20

## 2020-09-16 MED ORDER — MORPHINE SULFATE (PF) 2 MG/ML IV SOLN
2.0000 mg | INTRAVENOUS | Status: DC | PRN
  Administered 2020-09-16 – 2020-09-17 (×4): 2 mg via INTRAVENOUS
  Filled 2020-09-16 (×4): qty 1

## 2020-09-16 MED ORDER — ONDANSETRON HCL 4 MG/2ML IJ SOLN
4.0000 mg | Freq: Four times a day (QID) | INTRAMUSCULAR | Status: DC | PRN
  Administered 2020-09-16 – 2020-09-18 (×3): 4 mg via INTRAVENOUS
  Filled 2020-09-16 (×3): qty 2

## 2020-09-16 MED ORDER — MORPHINE SULFATE (PF) 4 MG/ML IV SOLN
4.0000 mg | Freq: Once | INTRAVENOUS | Status: AC
Start: 2020-09-16 — End: 2020-09-16
  Administered 2020-09-16: 4 mg via INTRAVENOUS
  Filled 2020-09-16: qty 1

## 2020-09-16 MED ORDER — LORAZEPAM 2 MG PO TABS: 0.0000 mg | ORAL_TABLET | Freq: Two times a day (BID) | ORAL | Status: DC

## 2020-09-16 MED ORDER — LORAZEPAM 2 MG/ML IJ SOLN: 0.0000 mg | Freq: Two times a day (BID) | INTRAMUSCULAR | Status: DC

## 2020-09-16 MED ORDER — LORAZEPAM 2 MG PO TABS
0.0000 mg | ORAL_TABLET | Freq: Four times a day (QID) | ORAL | Status: AC
  Administered 2020-09-16: 2 mg via ORAL
  Filled 2020-09-16: qty 1

## 2020-09-16 MED ORDER — THIAMINE HCL 100 MG/ML IJ SOLN
Freq: Once | INTRAVENOUS | Status: DC
  Filled 2020-09-16: qty 1000

## 2020-09-16 MED ORDER — THIAMINE HCL 100 MG/ML IJ SOLN
100.0000 mg | Freq: Every day | INTRAMUSCULAR | Status: DC
  Administered 2020-09-16: 100 mg via INTRAVENOUS
  Filled 2020-09-16: qty 2

## 2020-09-16 MED ORDER — ACETAMINOPHEN 325 MG PO TABS
650.0000 mg | ORAL_TABLET | Freq: Once | ORAL | Status: AC
  Administered 2020-09-16: 650 mg via ORAL
  Filled 2020-09-16: qty 2

## 2020-09-16 MED ORDER — HYDROMORPHONE HCL 1 MG/ML IJ SOLN
0.5000 mg | Freq: Once | INTRAMUSCULAR | Status: AC
  Administered 2020-09-16: 0.5 mg via INTRAVENOUS
  Filled 2020-09-16: qty 1

## 2020-09-16 MED ORDER — THIAMINE HCL 100 MG/ML IJ SOLN
Freq: Once | INTRAVENOUS | Status: AC
  Filled 2020-09-16: qty 1000

## 2020-09-16 MED ORDER — THIAMINE HCL 100 MG/ML IJ SOLN
100.0000 mg | Freq: Once | INTRAMUSCULAR | Status: DC
  Filled 2020-09-16: qty 2

## 2020-09-16 MED ORDER — FENTANYL CITRATE (PF) 100 MCG/2ML IJ SOLN
50.0000 ug | Freq: Once | INTRAMUSCULAR | Status: AC
  Administered 2020-09-16: 50 ug via INTRAVENOUS
  Filled 2020-09-16: qty 2

## 2020-09-16 MED ORDER — LORAZEPAM 2 MG/ML IJ SOLN
1.0000 mg | Freq: Once | INTRAMUSCULAR | Status: DC
  Filled 2020-09-16: qty 1

## 2020-09-16 MED ORDER — POTASSIUM CHLORIDE CRYS ER 20 MEQ PO TBCR
40.0000 meq | EXTENDED_RELEASE_TABLET | Freq: Once | ORAL | Status: AC
  Administered 2020-09-16: 40 meq via ORAL
  Filled 2020-09-16: qty 2

## 2020-09-16 NOTE — ED Notes (Signed)
This RN read full informed consent to patient. Patient states understanding and consents for the procedure. Consent form signed by patient, this RN and will be signed by Ward, DO.

## 2020-09-16 NOTE — ED Triage Notes (Signed)
Patient states that two days ago he was drinking and slipped and fell on the ice. Patient states that he started and having increase pain from his fall tonight. Patient states that he has been drinking tonight. Patient with swelling and deformity to his left ankle. Significant bruising to bilateral arms and left lower leg. Patient with swelling to left eye. Patient states that he did hit his head in the fall but denies LOC.  Patient denies taking blood thinners.

## 2020-09-16 NOTE — ED Notes (Signed)
Patient assisted with urinal   

## 2020-09-16 NOTE — Progress Notes (Signed)
CT head reviewed, patient has some small mostly hypodense collections. The hyperdense likely represents membrane. He is clear for surgery but would avoid anticoagulation for 2 days. Would recommend repeat CT head in 6 hours

## 2020-09-16 NOTE — ED Notes (Signed)
Emptied patient urinal. Noted dark amber color. Patient denies pain, resting in bed. Lights dimmed so patient can rest.

## 2020-09-16 NOTE — ED Notes (Signed)
Dr. Elesa Massed to triage to see patient.

## 2020-09-16 NOTE — ED Notes (Signed)
Pt IV leaking at this time. IV removed and new IV placed.

## 2020-09-16 NOTE — ED Provider Notes (Signed)
Dr. Allena Katz plans to do external fixation today.  We will keep patient n.p.o.  Discussed with Dr. Adriana Simas from neurosurgery.  Patient has bilateral subdural hematomas. Recommend repeat ct head 6 hours. But appears to be old in nature and cleared for surgery  EKG my interpretation is sinus tachycardia rate of 106, no ST elevation, no T wave inversions, normal intervals  Discussed with hospital team for admission.  COVID swab is pending   Concha Se, MD 09/16/20 [phone]

## 2020-09-16 NOTE — ED Provider Notes (Signed)
[facility] Emergency Department Provider Note ____________________________________________   Event Date/Time   First MD Initiated Contact with Patient 09/16/20 [phone]     (approximate)  I have reviewed the triage vital signs and the nursing notes.   HISTORY  Chief Complaint Fall    HPI Chad Walsh is a 40 y.o. male history of alcohol abuse who presents to the emergency department with a left ankle injury that occurred 2 days ago.  States he slipped, twisting his ankle on the ice.  He did hit his head but does not think he lost consciousness.  States he has been drinking "a lot" lately.  Denies drug use.  Denies history of alcohol withdrawal seizures, DTs.  Does have multiple superficial lacerations noted to his face and upper extremities that he reports are from the fall.  Unsure of his last tetanus vaccination.         Past Medical History:  Diagnosis Date  . ETOH abuse     Patient Active Problem List   Diagnosis Date Noted  . Alcohol abuse 08/01/2020  . Depression 08/01/2020  . Grieving 08/01/2020  . Gastric reflux 08/01/2020  . Vitamin D deficiency 08/01/2020    Past Surgical History:  Procedure Laterality Date  . TONSILLECTOMY      Prior to Admission medications   Not on File    Allergies Patient has no known allergies.  No family history on file.  Social History Social History   Tobacco Use  . Smoking status: Never Smoker  . Smokeless tobacco: Never Used  Vaping Use  . Vaping Use: Never used  Substance Use Topics  . Alcohol use: Yes  . Drug use: Never    Review of Systems Constitutional: No fever. Eyes: No visual changes. ENT: No sore throat. Cardiovascular: Denies chest pain. Respiratory: Denies shortness of breath. Gastrointestinal: No nausea, vomiting, diarrhea. Genitourinary: Negative for dysuria. Musculoskeletal: Negative for back pain. Skin: Negative for rash. Neurological: Negative for focal  weakness or numbness.   ____________________________________________   PHYSICAL EXAM:  VITAL SIGNS: ED Triage Vitals  Enc Vitals Group     BP 09/16/20 0047 (!) 141/97     Pulse Rate 09/16/20 0047 (!) 104     Resp 09/16/20 0047 (!) 22     Temp 09/16/20 0047 98 F (36.7 C)     Temp Source 09/16/20 0047 Oral     SpO2 09/16/20 0047 97 %     Weight 09/16/20 0050 208 lb (94.3 kg)     Height 09/16/20 0050 5\' 8"  (1.727 m)     Head Circumference --      Peak Flow --      Pain Score 09/16/20 0050 7     Pain Loc --      Pain Edu? --      Excl. in GC? --    CONSTITUTIONAL: Alert and oriented and responds appropriately to questions. GCS 15, disheveled, appears intoxicated HEAD: Normocephalic; multiple abrasions to patient's face EYES: Conjunctivae clear, PERRL, EOMI ENT: normal nose; no rhinorrhea; moist mucous membranes; pharynx without lesions noted; no dental injury; no septal hematoma NECK: Supple, no meningismus, no LAD; no midline spinal tenderness, step-off or deformity; trachea midline CARD: Regular and tachycardic; S1 and S2 appreciated; no murmurs, no clicks, no rubs, no gallops RESP: Normal chest excursion without splinting or tachypnea; breath sounds clear and equal bilaterally; no wheezes, no rhonchi, no rales; no hypoxia or respiratory distress CHEST:  chest wall stable, no crepitus or  ecchymosis or deformity, nontender to palpation; no flail chest ABD/GI: Normal bowel sounds; non-distended; soft, non-tender, no rebound, no guarding; no ecchymosis or other lesions noted PELVIS:  stable, nontender to palpation BACK:  The back appears normal and is non-tender to palpation, there is no CVA tenderness; no midline spinal tenderness, step-off or deformity EXT: Multiple bruises to patient's extremities in different stages of healing.  He has obvious fracture or dislocation of the left ankle but has 2+ DP pulse on the left foot.  No tenderness over the proximal left fibula.   Compartments in the left lower extremity are soft. SKIN: Normal color for age and race; warm NEURO: Moves all extremities equally PSYCH: The patient's mood and manner are appropriate.   ____________________________________________   LABS (all labs ordered are listed, but only abnormal results are displayed)  Labs Reviewed  SARS CORONAVIRUS 2 BY RT PCR (HOSPITAL ORDER, PERFORMED IN [facility])  CBC WITH DIFFERENTIAL/PLATELET  COMPREHENSIVE METABOLIC PANEL  ETHANOL   ____________________________________________  EKG  none ____________________________________________  RADIOLOGY I, Philbert Ocallaghan, personally viewed and evaluated these images (plain radiographs) as part of my medical decision making, as well as reviewing the written report by the radiologist.  ED MD interpretation: Left ankle fracture dislocation  Official radiology report(s): DG Ankle Complete Left  Result Date: 09/16/2020 CLINICAL DATA:  Left ankle pain after slip and fall injury 2 days ago. EXAM: LEFT ANKLE COMPLETE - 3+ VIEW COMPARISON:  None. FINDINGS: Fracture dislocation of the left ankle. There is an oblique fracture of the distal fibula with 4 mm lateral displacement and 13 mm overriding of the distal fracture fragment. Lateral dislocation of the talus with respect to the tibia of about 1.8 cm. Tiny bone fragment medial to the talus likely represents a ligamentous avulsion fragment. Diffuse soft tissue swelling. IMPRESSION: Fracture dislocation of the left ankle with lateral dislocation of the talus with respect to the tibia. Electronically Signed   By: Burman Nieves M.D.   On: 09/16/2020 01:32    ____________________________________________   PROCEDURES  Procedure(s) performed (including Critical Care):  .Sedation  Date/Time: 09/16/2020 7:00 AM Performed by: Billiejo Sorto, Layla Maw, DO Authorized by: Morghan Kester, Layla Maw, DO   Consent:    Consent obtained:  Verbal and written   Consent given by:   Patient   Risks discussed:  Allergic reaction, dysrhythmia, inadequate sedation, nausea, prolonged hypoxia resulting in organ damage, prolonged sedation necessitating reversal, respiratory compromise necessitating ventilatory assistance and intubation and vomiting   Alternatives discussed:  Analgesia without sedation, anxiolysis and regional anesthesia Universal protocol:    Procedure explained and questions answered to patient or proxy's satisfaction: yes     Relevant documents present and verified: yes     Test results available: yes     Imaging studies available: yes     Required blood products, implants, devices, and special equipment available: yes     Site/side marked: yes     Immediately prior to procedure, a time out was called: yes     Patient identity confirmed:  Verbally with patient Indications:    Procedure performed:  Dislocation reduction   Procedure necessitating sedation performed by:  Physician performing sedation Pre-sedation assessment:    Time since last food or drink:  6pm   ASA classification: class 2 - patient with mild systemic disease     Mouth opening:  3 or more finger widths   Thyromental distance:  4 finger widths   Mallampati score:  I - soft  palate, uvula, fauces, pillars visible   Neck mobility: normal     Pre-sedation assessments completed and reviewed: airway patency, cardiovascular function, hydration status, mental status, nausea/vomiting, pain level, respiratory function and temperature     Pre-sedation assessment completed:  09/16/2020 6:00 AM Immediate pre-procedure details:    Reassessment: Patient reassessed immediately prior to procedure     Reviewed: vital signs, relevant labs/tests and NPO status     Verified: bag valve mask available, emergency equipment available, intubation equipment available, IV patency confirmed, oxygen available and suction available   Procedure details (see MAR for exact dosages):    Preoxygenation:  Nasal cannula    Sedation:  Propofol   Intended level of sedation: deep   Analgesia:  Morphine   Intra-procedure monitoring:  Blood pressure monitoring, cardiac monitor, continuous pulse oximetry, frequent LOC assessments, frequent vital sign checks and continuous capnometry   Intra-procedure events: none     Total Provider sedation time (minutes):  16 Post-procedure details:    Post-sedation assessment completed:  09/16/2020 7:27 AM   Attendance: Constant attendance by certified staff until patient recovered     Recovery: Patient returned to pre-procedure baseline     Post-sedation assessments completed and reviewed: airway patency, cardiovascular function, hydration status, mental status, nausea/vomiting, pain level, respiratory function and temperature     Patient is stable for discharge or admission: yes     Procedure completion:  Tolerated well, no immediate complications Reduction of dislocation  Date/Time: 09/16/2020 7:00 AM Performed by: Daesia Zylka, Layla MawKristen N, DO Authorized by: Scott Vanderveer, Layla MawKristen N, DO  Consent: Verbal consent obtained. Written consent obtained. Risks and benefits: risks, benefits and alternatives were discussed Consent given by: patient Patient understanding: patient states understanding of the procedure being performed Patient consent: the patient's understanding of the procedure matches consent given Procedure consent: procedure consent matches procedure scheduled Relevant documents: relevant documents present and verified Test results: test results available and properly labeled Site marked: the operative site was marked Imaging studies: imaging studies available Required items: required blood products, implants, devices, and special equipment available Patient identity confirmed: verbally with patient Time out: Immediately prior to procedure a "time out" was called to verify the correct patient, procedure, equipment, support staff and site/side marked as required. Preparation: Patient  was prepped and draped in the usual sterile fashion. Local anesthesia used: no  Anesthesia: Local anesthesia used: no  Sedation: Patient sedated: yes Sedation type: moderate (conscious) sedation Sedatives: propofol Analgesia: morphine Sedation start date/time: 09/16/2020 7:03 AM Sedation end date/time: 09/16/2020 7:19 AM Vitals: Vital signs were monitored during sedation.  Patient tolerance: patient tolerated the procedure well with no immediate complications  .Splint Application  Date/Time: 09/16/2020 7:28 AM Performed by: Paloma Grange, Layla MawKristen N, DO Authorized by: Deamonte Sayegh, Layla MawKristen N, DO   Consent:    Consent obtained:  Verbal   Consent given by:  Patient   Risks, benefits, and alternatives were discussed: yes     Risks discussed:  Discoloration, numbness, pain and swelling Universal protocol:    Procedure explained and questions answered to patient or proxy's satisfaction: yes     Relevant documents present and verified: yes     Test results available: yes     Imaging studies available: yes     Required blood products, implants, devices, and special equipment available: yes     Site/side marked: yes     Immediately prior to procedure a time out was called: yes     Patient identity confirmed:  Verbally with patient Pre-procedure details:  Distal neurologic exam:  Normal   Distal perfusion: distal pulses strong   Procedure details:    Location:  Ankle   Ankle location:  L ankle   Strapping: yes     Cast type:  Short leg   Splint type:  Short leg and ankle stirrup   Supplies:  Fiberglass   Attestation: Splint applied and adjusted personally by me   Post-procedure details:    Distal neurologic exam:  Normal   Distal perfusion: distal pulses strong     Procedure completion:  Tolerated   Post-procedure imaging: reviewed      CRITICAL CARE Performed by: Rochele Raring   Total critical care time: 45 minutes  Critical care time was exclusive of separately billable procedures  and treating other patients.  Critical care was necessary to treat or prevent imminent or life-threatening deterioration.  Critical care was time spent personally by me on the following activities: development of treatment plan with patient and/or surrogate as well as nursing, discussions with consultants, evaluation of patient's response to treatment, examination of patient, obtaining history from patient or surrogate, ordering and performing treatments and interventions, ordering and review of laboratory studies, ordering and review of radiographic studies, pulse oximetry and re-evaluation of patient's condition.  ____________________________________________   INITIAL IMPRESSION / ASSESSMENT AND PLAN / ED COURSE  As part of my medical decision making, I reviewed the following data within the electronic MEDICAL RECORD NUMBER Nursing notes reviewed and incorporated, Radiograph reviewed she has fracture dislocation of the left ankle, A consult was requested and obtained from this/these consultant(s) Orthopedics and Notes from prior ED visits         Patient here with mechanical fall with left ankle fracture dislocation.  Will perform procedural sedation and place in splint.  Also needs CT imaging of his head, face and neck given the head and facial trauma.  He is intoxicated here.  We will continue to monitor closely.  Will give pain medication.  He states he has been n.p.o. for 12 hours.   While awaiting a bed for sedation, patient now more tachycardic and tremulous and appears to be in alcohol withdrawal.  We will continue IV hydration and give Ativan, thiamine.    7:45 AM  Spoke to Dr. Allena Katz with orthopedics.  He has reviewed patient's imaging and recommends obtaining a CT of the ankle.  Recommends talking to podiatry regarding potential repair.  I feel patient will be a poor candidate for being discharged to have repair done as an outpatient given his history of alcohol abuse.  I did discuss the  case with on-call podiatrist Dr. Excell Seltzer.  He states given how much swelling is present and that this appears to be an unstable fracture he feels the patient may need external fixator.  Case signed out to oncoming ED physician.  It sounds like patient will need external fixation whether that could be done at [facility] or needs to be transferred is yet to be determined.  CT imaging of head, neck and face as well as the left ankle pending.  Labs and COVID test pending as well.  He has been started on CIWA protocol for alcohol withdrawal.  Getting IV fluids, Ativan, thiamine.  Currently NPO. ____________________________________________   FINAL CLINICAL IMPRESSION(S) / ED DIAGNOSES  Final diagnoses:  Injury of head, initial encounter  Closed fracture dislocation of left ankle, initial encounter  Alcohol abuse     ED Discharge Orders    None      *Please  note:  Chad Walsh was evaluated in Emergency Department on 09/16/2020 for the symptoms described in the history of present illness. He was evaluated in the context of the global COVID-19 pandemic, which necessitated consideration that the patient might be at risk for infection with the SARS-CoV-2 virus that causes COVID-19. Institutional protocols and algorithms that pertain to the evaluation of patients at risk for COVID-19 are in a state of rapid change based on information released by regulatory bodies including the CDC and federal and state organizations. These policies and algorithms were followed during the patient's care in the ED.  Some ED evaluations and interventions may be delayed as a result of limited staffing during and the pandemic.*   Note:  This document was prepared using Dragon voice recognition software and may include unintentional dictation errors.   Farra Nikolic, Layla MawKristen N, DO 09/16/20 [phone]

## 2020-09-16 NOTE — H&P (Signed)
History and Physical    CHANTRY HEADEN ZOX:[id number] DOB: 17-Feb-1981 DOA: 09/16/2020  PCP: Patient, No Pcp Per   Patient coming from: Home  I have personally briefly reviewed patient's old medical records in [facility]  Chief Complaint: Left ankle pain and swelling x2 days  HPI: KODA ROUTON is a 40 y.o. male with medical history significant for alcohol abuse who presents to the ER for evaluation of pain and swelling involving his left ankle.  Patient stated that he was drinking about 2 days ago when he slipped and fell on ice twisting his left ankle.  He admits to hitting his head but does not think he lost consciousness.  He has had pain and swelling in that left ankle which has progressively worsened prompting his visit to the ER.  He rates his pain a 7 x 10 intensity at its worst and pain is worse when he bears weight on his left leg.  He has no relieving factors.  Patient noted to have multiple superficial lacerations to his face and upper extremities which he reports are from the fall.  He is unsure of his last tetanus vaccination. He denies having any headache, no blurry vision, no chest pain, no diaphoresis, no palpitations, no dizziness, no lightheadedness, no orthopnea, no shortness of breath, no nausea, no vomiting, no abdominal pain, no changes in his bowel habits, no urinary symptoms, no fever, no chills, no cough. Labs show white count 9.9, hemoglobin 10.2, hematocrit 29.5, MCV 98, RDW 17.8, platelet count 206 SARS coronavirus 2 PCR test is pending CT scan of left ankle shows bimalleolar fracture-dislocation of the left ankle as described above.  Interval reduction of tibiotalar joint dislocation with improved alignment. Persistent lateral translation of the talar dome relative to the tibia with widening of the medial clear space. Several tiny ossific densities inferior to the tip of the medial malleolus likely representing tiny cortical avulsions related to  anterior and posterior talofibular ligament injuries. Scattered foci of increased density within the subcutaneous soft tissues overlying the medial aspect of the distal tibial metaphysis compatible with hematoma. Somewhat ill-defined collection measures approximately 4.0 x 1.0 x 3.5 cm. CT scan of head without contrast and cervical spine shows small bilateral subdural hematomas measuring up to 5 mm in maximal thickness. The collections are mixed density but include high-density acute appearing components. Negative for facial or cervical spine fracture. X-ray of the left ankle shows interval casting with improved alignment of the tibiotalar joint and of the distal fibular fracture. Despite the interval reduction however there is still mild lateral dislocation of the talus relative to the distal tibia as well as offset of the distal fibular fracture. Twelve-lead EKG reviewed by me shows sinus tachycardia.    ED Course: Patient is a 40 year old male with a history of alcohol abuse who presents to the emergency room for evaluation of left ankle pain and swelling for 2 days.  Patient is status post fall and has a left ankle fracture.  CT scan of the head shows subdural hematoma.  Neurosurgery was consulted in the ER, recommendations in chart.  Patient will be admitted to the hospital for surgical repair of his ankle fracture.    Review of Systems: As per HPI otherwise all systems reviewed and negative.    Past Medical History:  Diagnosis Date  . ETOH abuse     Past Surgical History:  Procedure Laterality Date  . TONSILLECTOMY       reports that he has  never smoked. He has never used smokeless tobacco. He reports current alcohol use. He reports that he does not use drugs.  No Known Allergies  Family History  Problem Relation Age of Onset  . Lung cancer Mother      Prior to Admission medications   Not on File    Physical Exam: Vitals:   09/16/20 0710 09/16/20 0715 09/16/20 0730  09/16/20 0830  BP: 116/83 130/72 133/62 119/70  Pulse: (!) 117 (!) 110 (!) 109 (!) 106  Resp: (!) 25 (!) 24 17 19   Temp:      TempSrc:      SpO2: 91% 97% 99% 99%  Weight:      Height:         Vitals:   09/16/20 0710 09/16/20 0715 09/16/20 0730 09/16/20 0830  BP: 116/83 130/72 133/62 119/70  Pulse: (!) 117 (!) 110 (!) 109 (!) 106  Resp: (!) 25 (!) 24 17 19   Temp:      TempSrc:      SpO2: 91% 97% 99% 99%  Weight:      Height:        Constitutional: NAD, alert and oriented x 3.  Bruises over his left eye and left side of his face Eyes: PERRL, lids and conjunctivae normal ENMT: Mucous membranes are moist.  Dried blood in oral cavity Neck: normal, supple, no masses, no thyromegaly Respiratory: clear to auscultation bilaterally, no wheezing, no crackles. Normal respiratory effort. No accessory muscle use.  Cardiovascular: Tachycardia, no murmurs / rubs / gallops. No extremity edema. 2+ pedal pulses. No carotid bruits.  Abdomen: no tenderness, no masses palpated. No hepatosplenomegaly. Bowel sounds positive.  Musculoskeletal: no clubbing / cyanosis.  Left leg in cast Skin: no rashes, lesions, ulcers.  Neurologic: No gross focal neurologic deficit. Psychiatric: Normal mood and affect.   Labs on Admission: I have personally reviewed following labs and imaging studies  CBC: Recent Labs  Lab 09/16/20 0854  WBC 9.9  NEUTROABS 7.4  HGB 10.2*  HCT 29.5*  MCV 98.0  PLT 106*   Basic Metabolic Panel: Recent Labs  Lab 09/16/20 0854  NA 139  K 2.7*  CL 97*  CO2 28  GLUCOSE 110*  BUN <5*  CREATININE 0.50*  CALCIUM 7.8*   GFR: Estimated Creatinine Clearance: 140.9 mL/min (A) (by C-G formula based on SCr of 0.5 mg/dL (L)). Liver Function Tests: Recent Labs  Lab 09/16/20 0854  AST 256*  ALT 99*  ALKPHOS 123  BILITOT 4.0*  PROT 6.5  ALBUMIN 3.3*   No results for input(s): LIPASE, AMYLASE in the last 168 hours. No results for input(s): AMMONIA in the last 168  hours. Coagulation Profile: No results for input(s): INR, PROTIME in the last 168 hours. Cardiac Enzymes: No results for input(s): CKTOTAL, CKMB, CKMBINDEX, TROPONINI in the last 168 hours. BNP (last 3 results) No results for input(s): PROBNP in the last 8760 hours. HbA1C: No results for input(s): HGBA1C in the last 72 hours. CBG: No results for input(s): GLUCAP in the last 168 hours. Lipid Profile: No results for input(s): CHOL, HDL, LDLCALC, TRIG, CHOLHDL, LDLDIRECT in the last 72 hours. Thyroid Function Tests: No results for input(s): TSH, T4TOTAL, FREET4, T3FREE, THYROIDAB in the last 72 hours. Anemia Panel: No results for input(s): VITAMINB12, FOLATE, FERRITIN, TIBC, IRON, RETICCTPCT in the last 72 hours. Urine analysis:    Component Value Date/Time   COLORURINE YELLOW (A) 08/03/2018 0855   APPEARANCEUR CLOUDY (A) 08/03/2018 0855   LABSPEC 1.015 08/03/2018  0855   PHURINE 7.0 08/03/2018 0855   GLUCOSEU NEGATIVE 08/03/2018 0855   HGBUR NEGATIVE 08/03/2018 0855   BILIRUBINUR NEGATIVE 08/03/2018 0855   KETONESUR 5 (A) 08/03/2018 0855   PROTEINUR 30 (A) 08/03/2018 0855   NITRITE NEGATIVE 08/03/2018 0855   LEUKOCYTESUR NEGATIVE 08/03/2018 0855    Radiological Exams on Admission: DG Ankle Complete Left  Result Date: 09/16/2020 CLINICAL DATA:  Patient status post reduction of the left ankle. EXAM: LEFT ANKLE COMPLETE - 3+ VIEW COMPARISON:  Left ankle radiographs earlier same day. FINDINGS: Overlying casting material. Improved alignment of the oblique fracture of the distal fibula. There is improved alignment of the tibiotalar joint with mild lateral dislocation of the talus relative to the distal tibia. IMPRESSION: Interval casting with improved alignment of the tibiotalar joint and of the distal fibular fracture. Despite the interval reduction however there is still mild lateral dislocation of the talus relative to the distal tibia as well as offset of the distal fibular fracture.  Electronically Signed   By: Annia Beltrew  Davis M.D.   On: 09/16/2020 07:59   DG Ankle Complete Left  Result Date: 09/16/2020 CLINICAL DATA:  Left ankle pain after slip and fall injury 2 days ago. EXAM: LEFT ANKLE COMPLETE - 3+ VIEW COMPARISON:  None. FINDINGS: Fracture dislocation of the left ankle. There is an oblique fracture of the distal fibula with 4 mm lateral displacement and 13 mm overriding of the distal fracture fragment. Lateral dislocation of the talus with respect to the tibia of about 1.8 cm. Tiny bone fragment medial to the talus likely represents a ligamentous avulsion fragment. Diffuse soft tissue swelling. IMPRESSION: Fracture dislocation of the left ankle with lateral dislocation of the talus with respect to the tibia. Electronically Signed   By: Burman NievesWilliam  Stevens M.D.   On: 09/16/2020 01:32   CT Head Wo Contrast  Result Date: 09/16/2020 CLINICAL DATA:  Facial trauma related to fall 2 days ago EXAM: CT HEAD WITHOUT CONTRAST CT MAXILLOFACIAL WITHOUT CONTRAST CT CERVICAL SPINE WITHOUT CONTRAST TECHNIQUE: Multidetector CT imaging of the head, cervical spine, and maxillofacial structures were performed using the standard protocol without intravenous contrast. Multiplanar CT image reconstructions of the cervical spine and maxillofacial structures were also generated. COMPARISON:  None. FINDINGS: CT HEAD FINDINGS Brain: Trace high-density thickening of the right tentorium, 2 mm in maximum. Mixed density subdural collections along the bilateral cerebral convexity, on the left measuring up to 5 mm in thickness at a level where there is both low and high density. On the right subdural collection is mainly low-density with a few wispy high-density areas anteriorly and in the midline. No parenchymal hemorrhage, hydrocephalus, or infarction. Vascular: No hyperdense vessel or unexpected calcification. Skull: Negative for fracture CT MAXILLOFACIAL FINDINGS Osseous: No fracture or mandibular dislocation. Orbits:  No evidence of injury Sinuses: Negative for hemosinus Soft tissues: No acute finding.  Fatty atrophy of the left parotid. CT CERVICAL SPINE FINDINGS Alignment: Normal Skull base and vertebrae: No acute fracture Soft tissues and spinal canal: No prevertebral fluid or swelling. No visible canal hematoma. Disc levels:  No significant degenerative changes Upper chest: No visible injury Critical Value/emergent results were called by telephone at the time of interpretation on 09/16/2020 at 8:36 am to provider Dr Alfred LevinsFunk, who verbally acknowledged these results. IMPRESSION: 1. Small bilateral subdural hematomas measuring up to 5 mm in maximal thickness. The collections are mixed density but include high-density acute appearing components. 2. Negative for facial or cervical spine fracture. Electronically Signed  By: Marnee SpringJonathon  Watts M.D.   On: 09/16/2020 08:41   CT Cervical Spine Wo Contrast  Result Date: 09/16/2020 CLINICAL DATA:  Facial trauma related to fall 2 days ago EXAM: CT HEAD WITHOUT CONTRAST CT MAXILLOFACIAL WITHOUT CONTRAST CT CERVICAL SPINE WITHOUT CONTRAST TECHNIQUE: Multidetector CT imaging of the head, cervical spine, and maxillofacial structures were performed using the standard protocol without intravenous contrast. Multiplanar CT image reconstructions of the cervical spine and maxillofacial structures were also generated. COMPARISON:  None. FINDINGS: CT HEAD FINDINGS Brain: Trace high-density thickening of the right tentorium, 2 mm in maximum. Mixed density subdural collections along the bilateral cerebral convexity, on the left measuring up to 5 mm in thickness at a level where there is both low and high density. On the right subdural collection is mainly low-density with a few wispy high-density areas anteriorly and in the midline. No parenchymal hemorrhage, hydrocephalus, or infarction. Vascular: No hyperdense vessel or unexpected calcification. Skull: Negative for fracture CT MAXILLOFACIAL FINDINGS  Osseous: No fracture or mandibular dislocation. Orbits: No evidence of injury Sinuses: Negative for hemosinus Soft tissues: No acute finding.  Fatty atrophy of the left parotid. CT CERVICAL SPINE FINDINGS Alignment: Normal Skull base and vertebrae: No acute fracture Soft tissues and spinal canal: No prevertebral fluid or swelling. No visible canal hematoma. Disc levels:  No significant degenerative changes Upper chest: No visible injury Critical Value/emergent results were called by telephone at the time of interpretation on 09/16/2020 at 8:36 am to provider Dr Alfred LevinsFunk, who verbally acknowledged these results. IMPRESSION: 1. Small bilateral subdural hematomas measuring up to 5 mm in maximal thickness. The collections are mixed density but include high-density acute appearing components. 2. Negative for facial or cervical spine fracture. Electronically Signed   By: Marnee SpringJonathon  Watts M.D.   On: 09/16/2020 08:41   CT Ankle Left Wo Contrast  Result Date: 09/16/2020 CLINICAL DATA:  Evaluate left ankle fracture EXAM: CT OF THE LEFT ANKLE WITHOUT CONTRAST TECHNIQUE: Multidetector CT imaging of the left ankle was performed according to the standard protocol. Multiplanar CT image reconstructions were also generated. COMPARISON:  09/16/2020 FINDINGS: Bones/Joint/Cartilage Spiral fracture of the distal fibular metaphysis with 3 mm of lateral displacement. 5.0 cm butterfly fracture fragment along the medial fracture margin with slight medial displacement. Widening of the distal tibiofibular joint compatible with syndesmotic injury. Lateral malleolus remains aligned with the lateral talus. Comminuted minimally displaced posterior malleolar fracture (series 7, images 50-63) with multiple small intra-articular fracture fragments at the posterior aspect of the tibiotalar joint. Previously seen tibiotalar joint dislocation has been reduced with improved alignment. Persistent lateral translation of the talar dome relative to the tibia  with widening of the medial clear space. There are a few tiny ossific densities inferior to the tip of the medial malleolus likely representing tiny cortical avulsions related to anterior and posterior talofibular ligament injuries. Subtalar joints aligned. Osseous alignment of the midfoot remains anatomic. TMT joints intact. No fractures evident within the midfoot or visualized forefoot. Ligaments Suboptimally assessed by CT. Muscles and Tendons No acute musculotendinous injury evident by CT. Soft tissues Circumferential soft tissue swelling about the ankle. There are scattered foci of increased density within the subcutaneous soft tissues overlying the medial aspect of the distal tibial metaphysis compatible with hematoma. Somewhat ill-defined collection measures approximately 4.0 x 1.0 x 3.5 cm (series 9, image 171; series 8, image 132). IMPRESSION: 1. Bimalleolar fracture-dislocation of the left ankle as described above. 2. Interval reduction of tibiotalar joint dislocation with improved alignment. Persistent  lateral translation of the talar dome relative to the tibia with widening of the medial clear space. 3. Several tiny ossific densities inferior to the tip of the medial malleolus likely representing tiny cortical avulsions related to anterior and posterior talofibular ligament injuries. 4. Scattered foci of increased density within the subcutaneous soft tissues overlying the medial aspect of the distal tibial metaphysis compatible with hematoma. Somewhat ill-defined collection measures approximately 4.0 x 1.0 x 3.5 cm. Electronically Signed   By: Duanne Guess D.O.   On: 09/16/2020 09:17   CT Maxillofacial Wo Contrast  Result Date: 09/16/2020 CLINICAL DATA:  Facial trauma related to fall 2 days ago EXAM: CT HEAD WITHOUT CONTRAST CT MAXILLOFACIAL WITHOUT CONTRAST CT CERVICAL SPINE WITHOUT CONTRAST TECHNIQUE: Multidetector CT imaging of the head, cervical spine, and maxillofacial structures were  performed using the standard protocol without intravenous contrast. Multiplanar CT image reconstructions of the cervical spine and maxillofacial structures were also generated. COMPARISON:  None. FINDINGS: CT HEAD FINDINGS Brain: Trace high-density thickening of the right tentorium, 2 mm in maximum. Mixed density subdural collections along the bilateral cerebral convexity, on the left measuring up to 5 mm in thickness at a level where there is both low and high density. On the right subdural collection is mainly low-density with a few wispy high-density areas anteriorly and in the midline. No parenchymal hemorrhage, hydrocephalus, or infarction. Vascular: No hyperdense vessel or unexpected calcification. Skull: Negative for fracture CT MAXILLOFACIAL FINDINGS Osseous: No fracture or mandibular dislocation. Orbits: No evidence of injury Sinuses: Negative for hemosinus Soft tissues: No acute finding.  Fatty atrophy of the left parotid. CT CERVICAL SPINE FINDINGS Alignment: Normal Skull base and vertebrae: No acute fracture Soft tissues and spinal canal: No prevertebral fluid or swelling. No visible canal hematoma. Disc levels:  No significant degenerative changes Upper chest: No visible injury Critical Value/emergent results were called by telephone at the time of interpretation on 09/16/2020 at 8:36 am to provider Dr Alfred Levins, who verbally acknowledged these results. IMPRESSION: 1. Small bilateral subdural hematomas measuring up to 5 mm in maximal thickness. The collections are mixed density but include high-density acute appearing components. 2. Negative for facial or cervical spine fracture. Electronically Signed   By: Marnee Spring M.D.   On: 09/16/2020 08:41    EKG: Independently reviewed.  Sinus tachycardia  Assessment/Plan Principal Problem:   Closed left ankle fracture Active Problems:   Alcohol abuse   Transaminitis   Hypokalemia   Subdural hematoma, post-traumatic (HCC)   Thrombocytopenia (HCC)       Closed left ankle fracture Status post fall Patient presents for evaluation of left ankle pain and swelling and is noted to have fracture dislocation of the left ankle with lateral dislocation of the talus with respect to the tibia. Immobilize left lower extremity Orthopedic consult for surgical repair Pain control    Subdural hematoma, posttraumatic Patient has a history of alcohol abuse and is status post fall where he hit his head but denies having any loss of consciousness CT scan of the head without contrast shows small bilateral subdural hematomas Neurosurgery was consulted in the ER and he recommends repeat CT scan of the head in 6 hours.  Avoid anticoagulants Place patient on serial neurochecks    History of alcohol abuse Patient has a history of alcohol abuse and is noted to have transaminitis with AST >>> ALT He is at high risk of developing alcohol withdrawal symptoms Will place patient on alcohol withdrawal protocol and administer lorazepam for CIWA  score of 8 or greater We will start patient on low-dose clonidine for withdrawal symptoms    Hypokalemia We will supplement potassium Check magnesium levels    Thrombocytopenia Secondary to alcohol abuse Monitor closely for bleeding Hold off on anticoagulation at this time   DVT prophylaxis: SCD Code Status: Full code Family Communication: Greater than 50% of time was spent discussing patient's condition and plan of care with him at the bedside.  All questions and concerns have been addressed.  He verbalizes understanding and agrees with the plan. Disposition Plan: Back to previous home environment Consults called: Orthopedic surgery    Cashton Hosley MD [facility]     09/16/2020, 10:34 AM

## 2020-09-16 NOTE — Consult Note (Signed)
ORTHOPAEDIC CONSULTATION  REQUESTING PHYSICIAN: Lucile Shutters, MD  Chief Complaint:   L ankle pain  History of Present Illness: Chad Walsh is a 40 y.o. male who had a fall 2 days ago.  He slipped and fell on ice, twisting his ankle.  This occurred while he was intoxicated from alcohol.  Of note, he has a significant history of alcohol abuse.  The patient states that he lives alone, but his father can come to help.  He is currently unemployed as he quit his job from [employer].  X-rays in the emergency department showed a lateral malleolus fracture with significant displacement and disruption of the ankle mortise.  He underwent a closed reduction and splint placement in the emergency department.  A CT scan was then obtained.  Of note, the patient was also found to have a subdural hematoma, and repeat CT scan will be obtained.   Past Medical History:  Diagnosis Date   ETOH abuse    Past Surgical History:  Procedure Laterality Date   TONSILLECTOMY     Social History   Socioeconomic History   Marital status: Single    Spouse name: Not on file   Number of children: Not on file   Years of education: Not on file   Highest education level: Not on file  Occupational History   Not on file  Tobacco Use   Smoking status: Never Smoker   Smokeless tobacco: Never Used  Vaping Use   Vaping Use: Never used  Substance and Sexual Activity   Alcohol use: Yes   Drug use: Never   Sexual activity: Not on file  Other Topics Concern   Not on file  Social History Narrative   Not on file   Social Determinants of Health   Financial Resource Strain: Not on file  Food Insecurity: Not on file  Transportation Needs: Not on file  Physical Activity: Not on file  Stress: Not on file  Social Connections: Not on file   Family History  Problem Relation Age of Onset   Lung cancer Mother    No Known  Allergies Prior to Admission medications   Not on File   Recent Labs    09/16/20 0854  WBC 9.9  HGB 10.2*  HCT 29.5*  PLT 106*  K 2.7*  CL 97*  CO2 28  BUN <5*  CREATININE 0.50*  GLUCOSE 110*  CALCIUM 7.8*   DG Ankle Complete Left  Result Date: 09/16/2020 CLINICAL DATA:  Patient status post reduction of the left ankle. EXAM: LEFT ANKLE COMPLETE - 3+ VIEW COMPARISON:  Left ankle radiographs earlier same Walsh. FINDINGS: Overlying casting material. Improved alignment of the oblique fracture of the distal fibula. There is improved alignment of the tibiotalar joint with mild lateral dislocation of the talus relative to the distal tibia. IMPRESSION: Interval casting with improved alignment of the tibiotalar joint and of the distal fibular fracture. Despite the interval reduction however there is still mild lateral dislocation of the talus relative to the distal tibia as well as offset of the distal fibular fracture. Electronically Signed   By: Annia Belt M.D.   On: 09/16/2020 07:59   DG Ankle Complete Left  Result Date: 09/16/2020 CLINICAL DATA:  Left ankle pain after slip and fall injury 2 days ago. EXAM: LEFT ANKLE COMPLETE - 3+ VIEW COMPARISON:  None. FINDINGS: Fracture dislocation of the left ankle. There is an oblique fracture of the distal fibula with 4 mm lateral displacement and 13 mm overriding  of the distal fracture fragment. Lateral dislocation of the talus with respect to the tibia of about 1.8 cm. Tiny bone fragment medial to the talus likely represents a ligamentous avulsion fragment. Diffuse soft tissue swelling. IMPRESSION: Fracture dislocation of the left ankle with lateral dislocation of the talus with respect to the tibia. Electronically Signed   By: Burman NievesWilliam  Stevens M.D.   On: 09/16/2020 01:32   CT Head Wo Contrast  Result Date: 09/16/2020 CLINICAL DATA:  Facial trauma related to fall 2 days ago EXAM: CT HEAD WITHOUT CONTRAST CT MAXILLOFACIAL WITHOUT CONTRAST CT CERVICAL  SPINE WITHOUT CONTRAST TECHNIQUE: Multidetector CT imaging of the head, cervical spine, and maxillofacial structures were performed using the standard protocol without intravenous contrast. Multiplanar CT image reconstructions of the cervical spine and maxillofacial structures were also generated. COMPARISON:  None. FINDINGS: CT HEAD FINDINGS Brain: Trace high-density thickening of the right tentorium, 2 mm in maximum. Mixed density subdural collections along the bilateral cerebral convexity, on the left measuring up to 5 mm in thickness at a level where there is both low and high density. On the right subdural collection is mainly low-density with a few wispy high-density areas anteriorly and in the midline. No parenchymal hemorrhage, hydrocephalus, or infarction. Vascular: No hyperdense vessel or unexpected calcification. Skull: Negative for fracture CT MAXILLOFACIAL FINDINGS Osseous: No fracture or mandibular dislocation. Orbits: No evidence of injury Sinuses: Negative for hemosinus Soft tissues: No acute finding.  Fatty atrophy of the left parotid. CT CERVICAL SPINE FINDINGS Alignment: Normal Skull base and vertebrae: No acute fracture Soft tissues and spinal canal: No prevertebral fluid or swelling. No visible canal hematoma. Disc levels:  No significant degenerative changes Upper chest: No visible injury Critical Value/emergent results were called by telephone at the time of interpretation on 09/16/2020 at 8:36 am to provider Dr Alfred LevinsFunk, who verbally acknowledged these results. IMPRESSION: 1. Small bilateral subdural hematomas measuring up to 5 mm in maximal thickness. The collections are mixed density but include high-density acute appearing components. 2. Negative for facial or cervical spine fracture. Electronically Signed   By: Marnee SpringJonathon  Watts M.D.   On: 09/16/2020 08:41   CT Cervical Spine Wo Contrast  Result Date: 09/16/2020 CLINICAL DATA:  Facial trauma related to fall 2 days ago EXAM: CT HEAD WITHOUT  CONTRAST CT MAXILLOFACIAL WITHOUT CONTRAST CT CERVICAL SPINE WITHOUT CONTRAST TECHNIQUE: Multidetector CT imaging of the head, cervical spine, and maxillofacial structures were performed using the standard protocol without intravenous contrast. Multiplanar CT image reconstructions of the cervical spine and maxillofacial structures were also generated. COMPARISON:  None. FINDINGS: CT HEAD FINDINGS Brain: Trace high-density thickening of the right tentorium, 2 mm in maximum. Mixed density subdural collections along the bilateral cerebral convexity, on the left measuring up to 5 mm in thickness at a level where there is both low and high density. On the right subdural collection is mainly low-density with a few wispy high-density areas anteriorly and in the midline. No parenchymal hemorrhage, hydrocephalus, or infarction. Vascular: No hyperdense vessel or unexpected calcification. Skull: Negative for fracture CT MAXILLOFACIAL FINDINGS Osseous: No fracture or mandibular dislocation. Orbits: No evidence of injury Sinuses: Negative for hemosinus Soft tissues: No acute finding.  Fatty atrophy of the left parotid. CT CERVICAL SPINE FINDINGS Alignment: Normal Skull base and vertebrae: No acute fracture Soft tissues and spinal canal: No prevertebral fluid or swelling. No visible canal hematoma. Disc levels:  No significant degenerative changes Upper chest: No visible injury Critical Value/emergent results were called by telephone at  the time of interpretation on 09/16/2020 at 8:36 am to provider Dr Alfred Levins, who verbally acknowledged these results. IMPRESSION: 1. Small bilateral subdural hematomas measuring up to 5 mm in maximal thickness. The collections are mixed density but include high-density acute appearing components. 2. Negative for facial or cervical spine fracture. Electronically Signed   By: Marnee Spring M.D.   On: 09/16/2020 08:41   CT Ankle Left Wo Contrast  Result Date: 09/16/2020 CLINICAL DATA:  Evaluate left  ankle fracture EXAM: CT OF THE LEFT ANKLE WITHOUT CONTRAST TECHNIQUE: Multidetector CT imaging of the left ankle was performed according to the standard protocol. Multiplanar CT image reconstructions were also generated. COMPARISON:  09/16/2020 FINDINGS: Bones/Joint/Cartilage Spiral fracture of the distal fibular metaphysis with 3 mm of lateral displacement. 5.0 cm butterfly fracture fragment along the medial fracture margin with slight medial displacement. Widening of the distal tibiofibular joint compatible with syndesmotic injury. Lateral malleolus remains aligned with the lateral talus. Comminuted minimally displaced posterior malleolar fracture (series 7, images 50-63) with multiple small intra-articular fracture fragments at the posterior aspect of the tibiotalar joint. Previously seen tibiotalar joint dislocation has been reduced with improved alignment. Persistent lateral translation of the talar dome relative to the tibia with widening of the medial clear space. There are a few tiny ossific densities inferior to the tip of the medial malleolus likely representing tiny cortical avulsions related to anterior and posterior talofibular ligament injuries. Subtalar joints aligned. Osseous alignment of the midfoot remains anatomic. TMT joints intact. No fractures evident within the midfoot or visualized forefoot. Ligaments Suboptimally assessed by CT. Muscles and Tendons No acute musculotendinous injury evident by CT. Soft tissues Circumferential soft tissue swelling about the ankle. There are scattered foci of increased density within the subcutaneous soft tissues overlying the medial aspect of the distal tibial metaphysis compatible with hematoma. Somewhat ill-defined collection measures approximately 4.0 x 1.0 x 3.5 cm (series 9, image 171; series 8, image 132). IMPRESSION: 1. Bimalleolar fracture-dislocation of the left ankle as described above. 2. Interval reduction of tibiotalar joint dislocation with  improved alignment. Persistent lateral translation of the talar dome relative to the tibia with widening of the medial clear space. 3. Several tiny ossific densities inferior to the tip of the medial malleolus likely representing tiny cortical avulsions related to anterior and posterior talofibular ligament injuries. 4. Scattered foci of increased density within the subcutaneous soft tissues overlying the medial aspect of the distal tibial metaphysis compatible with hematoma. Somewhat ill-defined collection measures approximately 4.0 x 1.0 x 3.5 cm. Electronically Signed   By: Duanne Guess D.O.   On: 09/16/2020 09:17   CT Maxillofacial Wo Contrast  Result Date: 09/16/2020 CLINICAL DATA:  Facial trauma related to fall 2 days ago EXAM: CT HEAD WITHOUT CONTRAST CT MAXILLOFACIAL WITHOUT CONTRAST CT CERVICAL SPINE WITHOUT CONTRAST TECHNIQUE: Multidetector CT imaging of the head, cervical spine, and maxillofacial structures were performed using the standard protocol without intravenous contrast. Multiplanar CT image reconstructions of the cervical spine and maxillofacial structures were also generated. COMPARISON:  None. FINDINGS: CT HEAD FINDINGS Brain: Trace high-density thickening of the right tentorium, 2 mm in maximum. Mixed density subdural collections along the bilateral cerebral convexity, on the left measuring up to 5 mm in thickness at a level where there is both low and high density. On the right subdural collection is mainly low-density with a few wispy high-density areas anteriorly and in the midline. No parenchymal hemorrhage, hydrocephalus, or infarction. Vascular: No hyperdense vessel or unexpected calcification. Skull:  Negative for fracture CT MAXILLOFACIAL FINDINGS Osseous: No fracture or mandibular dislocation. Orbits: No evidence of injury Sinuses: Negative for hemosinus Soft tissues: No acute finding.  Fatty atrophy of the left parotid. CT CERVICAL SPINE FINDINGS Alignment: Normal Skull base  and vertebrae: No acute fracture Soft tissues and spinal canal: No prevertebral fluid or swelling. No visible canal hematoma. Disc levels:  No significant degenerative changes Upper chest: No visible injury Critical Value/emergent results were called by telephone at the time of interpretation on 09/16/2020 at 8:36 am to provider Dr Alfred Levins, who verbally acknowledged these results. IMPRESSION: 1. Small bilateral subdural hematomas measuring up to 5 mm in maximal thickness. The collections are mixed density but include high-density acute appearing components. 2. Negative for facial or cervical spine fracture. Electronically Signed   By: Marnee Spring M.D.   On: 09/16/2020 08:41     Positive ROS: All other systems have been reviewed and were otherwise negative with the exception of those mentioned in the HPI and as above.  Physical Exam: BP 115/64    Pulse (!) 113    Temp 97.8 F (36.6 C) (Oral)    Resp 17    Ht 5\' 8"  (1.727 m)    Wt 94.3 kg    SpO2 97%    BMI 31.63 kg/m  General:  Alert, no acute distress Psychiatric:  Patient is competent for consent with normal mood and affect   Cardiovascular:  No pedal edema, regular rate and rhythm Respiratory:  No wheezing, non-labored breathing GI:  Abdomen is soft and non-tender Skin:  No lesions in the area of chief complaint, no erythema Neurologic:  Sensation intact distally, CN grossly intact Lymphatic:  No axillary or cervical lymphadenopathy  Orthopedic Exam:  LLE: + Able to wiggle toes SILT grossly over toes Toes wwp Splint in place   Imaging:  As above: Bimalleolar equivalent L distal fibula fracture with lateral talus dislocation with improved alignment of the ankle mortise after reduction.  Additionally, CT scan shows maintained alignment postreduction and small posterior malleolar fragments.  Assessment/Plan: Chad Walsh is a 40 y.o. male with a bimalleolar equivalent left ankle fracture   1.  I discussed the findings with the  patient as well as the various treatment options.  We discussed that surgical management would likely lead to the best result given the instability of this injury.  We discussed temporary fixation with external fixator with eventual conversion to ORIF versus maintaining reduction and splint with delayed ORIF once soft tissues swelling has resolved over the next 1-2 weeks.  The patient states that he will be able to maintain nonweightbearing restrictions in the splint and would prefer to only have one surgery.  Therefore, we agreed to proceed with nonsurgical management at this point in time with plans for surgery approximately 1-2 weeks from now after soft tissues swelling has improved.  2.  No plan for surgical intervention during this acute inpatient hospital stay.  3.  Follow-up with as an outpatient next week.   30   09/16/2020 1:36 PM

## 2020-09-17 LAB — COMPREHENSIVE METABOLIC PANEL
ALT: 108 U/L — ABNORMAL HIGH (ref 0–44)
AST: 302 U/L — ABNORMAL HIGH (ref 15–41)
Albumin: 3.4 g/dL — ABNORMAL LOW (ref 3.5–5.0)
Alkaline Phosphatase: 133 U/L — ABNORMAL HIGH (ref 38–126)
Anion gap: 13 (ref 5–15)
BUN: <5 mg/dL — ABNORMAL LOW (ref 6–20)
CO2: 29 mmol/L (ref 22–32)
Calcium: 7.9 mg/dL — ABNORMAL LOW (ref 8.9–10.3)
Chloride: 95 mmol/L — ABNORMAL LOW (ref 98–111)
Creatinine, Ser: 0.6 mg/dL — ABNORMAL LOW (ref 0.61–1.24)
GFR, Estimated: >60 mL/min (ref >60)
Glucose, Bld: 92 mg/dL (ref 70–99)
Potassium: 3.1 mmol/L — ABNORMAL LOW (ref 3.5–5.1)
Sodium: 137 mmol/L (ref 135–145)
Total Bilirubin: 6.4 mg/dL — ABNORMAL HIGH (ref 0.3–1.2)
Total Protein: 6.6 g/dL (ref 6.5–8.1)

## 2020-09-17 LAB — CBC
HCT: 31.2 % — ABNORMAL LOW (ref 39.0–52.0)
Hemoglobin: 10.8 g/dL — ABNORMAL LOW (ref 13.0–17.0)
MCH: 33.9 pg (ref 26.0–34.0)
MCHC: 34.6 g/dL (ref 30.0–36.0)
MCV: 97.8 fL (ref 80.0–100.0)
Platelets: 76 10*3/uL — ABNORMAL LOW (ref 150–400)
RBC: 3.19 MIL/uL — ABNORMAL LOW (ref 4.22–5.81)
RDW: 17.9 % — ABNORMAL HIGH (ref 11.5–15.5)
WBC: 8.5 10*3/uL (ref 4.0–10.5)
nRBC: 0.2 % (ref 0.0–0.2)

## 2020-09-17 LAB — MAGNESIUM: Magnesium: 1.1 mg/dL — ABNORMAL LOW (ref 1.7–2.4)

## 2020-09-17 LAB — HIV ANTIBODY (ROUTINE TESTING W REFLEX): HIV Screen 4th Generation wRfx: NONREACTIVE

## 2020-09-17 MED ORDER — MORPHINE SULFATE (PF) 2 MG/ML IV SOLN
2.0000 mg | INTRAVENOUS | Status: DC | PRN
Start: 2020-09-17 — End: 2020-09-20
  Administered 2020-09-17 – 2020-09-19 (×9): 2 mg via INTRAVENOUS
  Filled 2020-09-17 (×10): qty 1

## 2020-09-17 MED ORDER — ENOXAPARIN SODIUM 40 MG/0.4ML ~~LOC~~ SOLN
40.0000 mg | Freq: Every day | SUBCUTANEOUS | Status: DC
  Administered 2020-09-18 – 2020-09-20 (×3): 40 mg via SUBCUTANEOUS
  Filled 2020-09-17 (×4): qty 0.4

## 2020-09-17 MED ORDER — OXYCODONE HCL 5 MG PO TABS
5.0000 mg | ORAL_TABLET | Freq: Four times a day (QID) | ORAL | Status: DC | PRN
  Administered 2020-09-17 – 2020-09-19 (×6): 5 mg via ORAL
  Filled 2020-09-17 (×6): qty 1

## 2020-09-17 MED ORDER — SODIUM CHLORIDE 0.9 % IV SOLN: INTRAVENOUS | Status: AC

## 2020-09-17 MED ORDER — PANTOPRAZOLE SODIUM 40 MG PO TBEC
40.0000 mg | DELAYED_RELEASE_TABLET | Freq: Every day | ORAL | Status: DC
  Administered 2020-09-17 – 2020-09-20 (×4): 40 mg via ORAL
  Filled 2020-09-17 (×5): qty 1

## 2020-09-17 NOTE — Assessment & Plan Note (Signed)
-  Replete and recheck as needed 

## 2020-09-17 NOTE — Progress Notes (Signed)
   09/17/20 1038  Assess: MEWS Score  Temp 99.8 F (37.7 C)  BP (!) 137/54  Pulse Rate (!) 115  Resp (!) 24  SpO2 94 %  O2 Device Room Air  Assess: MEWS Score  MEWS Temp 0  MEWS Systolic 0  MEWS Pulse 2  MEWS RR 1  MEWS LOC 0  MEWS Score 3  MEWS Score Color Yellow  Assess: if the MEWS score is Yellow or Red  Were vital signs taken at a resting state? Yes  Focused Assessment Change from prior assessment (see assessment flowsheet)  Early Detection of Sepsis Score *See Row Information* Low  MEWS guidelines implemented *See Row Information* Yes  Take Vital Signs  Increase Vital Sign Frequency  Yellow: Q 2hr X 2 then Q 4hr X 2, if remains yellow, continue Q 4hrs  Escalate  MEWS: Escalate Yellow: discuss with charge nurse/RN and consider discussing with provider and RRT  Notify: Charge Nurse/RN  Name of Charge Nurse/RN Notified Patrice, RN  Date Charge Nurse/RN Notified 09/17/20  Time Charge Nurse/RN Notified 1038  Notify: Provider  Provider Name/Title Dr. Frederick Peers  Date Provider Notified 09/17/20  Time Provider Notified 1038  Notification Type Page  Notification Reason Other (Comment) (Lelevating RR/HR)  Response No new orders (Monitor/obtain [facility])  Date of Provider Response 09/17/20  Time of Provider Response 1040  Document  Progress note created (see row info) Yes

## 2020-09-17 NOTE — Evaluation (Signed)
Physical Therapy Evaluation Patient Details Name: MATAIO MELE MRN: [id number] DOB: 1980-12-13 Today's Date: 09/17/2020   History of Present Illness  Chad Walsh is a 40 y.o. male who had a fall 2 days ago.  He slipped and fell on ice, twisting his ankle.  This occurred while he was intoxicated from alcohol.  Of note, he has a significant history of alcohol abuse.  The patient states that he lives alone, but his father can come to help.  He is currently unemployed as he quit his job from [employer].  X-rays in the emergency department showed a lateral malleolus fracture with significant displacement and disruption of the ankle mortise.  He underwent a closed reduction and splint placement in the emergency department.  A CT scan was then obtained. Of note, the patient was also found to have a subdural hematoma, and repeat CT scan will be obtained.  Clinical Impression  Prior to admission pt was independent with mobility. Pt seen for PT evaluation with PT educating pt on NWB LLE but pt with great difficulty maintaining precautions during transfers & very short distance gait with RW & min assist. Pt lethargic/groggy throughout session, reporting this is due to pain medication. Pt noted to have elevated HR of 141 bpm after returning to bed after gait. While in bed HR noted to increase to 206 bpm after blowing nose with HR eventually decreasing to 122 bpm. Further mobility/exercises deferred - nurse made aware & reports medical team is aware of elevated HR.   Pt would benefit from ongoing acute PT services to progress gait, review HEP, and for stair training. Pt reports he has 3 STE his home without rails, but he plans to d/c to his father's house for "at least a couple days" where he has 3 STE with B rails. Reviewed potential need for someone to bump pt up steps in w/c or non emergent EMS transport home if pt is unable to successfully negotiate stairs while maintaining NWB LLE.     Follow Up  Recommendations Home health PT;Supervision for mobility/OOB    Equipment Recommendations  Rolling walker with 5" wheels;3in1 (PT);Wheelchair (measurements PT);Wheelchair cushion (measurements PT)    Recommendations for Other Services       Precautions / Restrictions Precautions Precautions: Fall Required Braces or Orthoses: Splint/Cast Splint/Cast: LLE Restrictions Weight Bearing Restrictions: Yes LLE Weight Bearing: Non weight bearing      Mobility  Bed Mobility Overal bed mobility: Modified Independent                  Transfers Overall transfer level: Needs assistance   Transfers: Sit to/from Stand;Stand Pivot Transfers Sit to Stand: Min assist Stand pivot transfers: Min assist       General transfer comment: cuing for safe hand placement when using RW  Ambulation/Gait Ambulation/Gait assistance: Min assist Gait Distance (Feet): 4 Feet Assistive device: Rolling walker (2 wheeled) Gait Pattern/deviations: Decreased step length - right Gait velocity: decreased   General Gait Details: very poor foot clearance RLE when stepping, decreased ability to coordinate & safely step backwards, poor ability to maintain NWB LLE  Stairs            Wheelchair Mobility    Modified Rankin (Stroke Patients Only)       Balance Overall balance assessment: Needs assistance Sitting-balance support: Feet supported;No upper extremity supported Sitting balance-Leahy Scale: Good       Standing balance-Leahy Scale: Poor Standing balance comment: BUE reliance on RW  Pertinent Vitals/Pain Pain Assessment: 0-10 Pain Score: 6  Pain Location: LLE Pain Descriptors / Indicators: Sore Pain Intervention(s): Premedicated before session;Monitored during session    Home Living Family/patient expects to be discharged to:: Private residence Living Arrangements: Alone Available Help at Discharge: Family;Available  PRN/intermittently Type of Home: House Home Access: Stairs to enter Entrance Stairs-Rails: Right;Left;Can reach both (pt has no rails at his home, B rails at his father's house) Entrance Stairs-Number of Steps: 3 Home Layout: One level Home Equipment: None      Prior Function Level of Independence: Independent               Hand Dominance        Extremity/Trunk Assessment   Upper Extremity Assessment Upper Extremity Assessment: Overall WFL for tasks assessed    Lower Extremity Assessment Lower Extremity Assessment: Generalized weakness       Communication   Communication: No difficulties  Cognition Arousal/Alertness: Lethargic;Suspect due to medications Behavior During Therapy: [facility] for tasks assessed/performed Overall Cognitive Status: Within Functional Limits for tasks assessed                                 General Comments: Requires encouragement for participation      General Comments      Exercises     Assessment/Plan    PT Assessment Patient needs continued PT services  PT Problem List Decreased strength;Decreased mobility;Decreased safety awareness;Decreased knowledge of precautions;Decreased activity tolerance;Cardiopulmonary status limiting activity;Decreased balance;Decreased knowledge of use of DME;Pain       PT Treatment Interventions DME instruction;Therapeutic activities;Gait training;Modalities;Patient/family education;Therapeutic exercise;Stair training;Balance training;Wheelchair mobility training;Functional mobility training;Neuromuscular re-education;Manual techniques    PT Goals (Current goals can be found in the Care Plan section)  Acute Rehab PT Goals Patient Stated Goal: get better PT Goal Formulation: With patient Time For Goal Achievement: 10/01/20 Potential to Achieve Goals: Good    Frequency Min 2X/week   Barriers to discharge Inaccessible home environment      Co-evaluation               AM-PAC  PT "6 Clicks" Mobility  Outcome Measure Help needed turning from your back to your side while in a flat bed without using bedrails?: None Help needed moving from lying on your back to sitting on the side of a flat bed without using bedrails?: None Help needed moving to and from a bed to a chair (including a wheelchair)?: A Little Help needed standing up from a chair using your arms (e.g., wheelchair or bedside chair)?: A Little Help needed to walk in hospital room?: A Lot Help needed climbing 3-5 steps with a railing? : Total 6 Click Score: 17    End of Session Equipment Utilized During Treatment: Gait belt Activity Tolerance: Patient limited by lethargy;Treatment limited secondary to medical complications (Comment) Patient left: in bed;with call bell/phone within reach;with bed alarm set Nurse Communication: Mobility status (elevated HR) PT Visit Diagnosis: Unsteadiness on feet (R26.81);Muscle weakness (generalized) (M62.81);Difficulty in walking, not elsewhere classified (R26.2)    Time: 3329-5188 PT Time Calculation (min) (ACUTE ONLY): 23 min   Charges:   PT Evaluation $PT Eval Low Complexity: 1 Low PT Treatments $Therapeutic Activity: 8-22 mins        Aleda Grana, PT, DPT 09/17/20, 4:43 PM   Sandi Mariscal 09/17/2020, 4:41 PM

## 2020-09-17 NOTE — Assessment & Plan Note (Signed)
-  Alcohol level 137 on admission.  States his last drink was on Saturday evening -Continue monitoring on CIWA protocol

## 2020-09-17 NOTE — Assessment & Plan Note (Signed)
-  Considered related to alcohol use -Trend LFTs

## 2020-09-17 NOTE — Plan of Care (Signed)
  Problem: Education: Goal: Knowledge of General Education information will improve Description: Including pain rating scale, medication(s)/side effects and non-pharmacologic comfort measures Outcome: Progressing   Problem: Clinical Measurements: Goal: Ability to maintain clinical measurements within normal limits will improve Outcome: Progressing   Problem: Nutrition: Goal: Adequate nutrition will be maintained Outcome: Progressing   Problem: Activity: Goal: Risk for activity intolerance will decrease Outcome: Progressing   Problem: Coping: Goal: Level of anxiety will decrease Outcome: Progressing

## 2020-09-17 NOTE — Assessment & Plan Note (Signed)
-    S/p mechanical fall at home associated with intoxication resulting in left bimalleolar ankle fracture with tibiotalar joint dislocation which was reduced in the ER -Recommendations are for external fixation while swelling improves over the next 1 to 2 weeks per Ortho and then definitive treatment with ORIF -Patient is amenable to this plan.  He is nonweightbearing to left lower extremity -PT consulted to begin mobilizing

## 2020-09-17 NOTE — Hospital Course (Signed)
Mr. Kober is a 40 year old male with PMH alcohol use who presented to the ER after developing pain and swelling in his left ankle.  He had stated that he fell approximately 2 days prior to admission after slipping on the snow/ice and twisting his left ankle.  He also hit his head when he fell to the ground but denied losing any consciousness. Due to no improvement in the pain, he presented for further work-up.  In the ER he underwent multiple imaging studies. CT head showed small bilateral subdural hematomas measuring up to 5 mm in maximal thickness.  He also underwent repeat CT head per neurosurgery recommendation approximately 6 hours after initial CT head which was stable.  He was seen by neurosurgery the day after admission as well, see recommendations. CT cervical spine and maxillofacial were negative for acute abnormalities or fracture.  Imaging studies of his left ankle including x-ray and CT were notable for a bimalleolar fracture/dislocation of the left ankle.  He underwent reduction of the tibiotalar joint in the ER.  He had significant swelling and also a hematoma noted on CT.  He was evaluated by orthopedic surgery with recommendations for continuing external fixation until edema improves in the next 1 to 2 weeks then pursuing definitive treatment outpatient with ORIF.  Patient was amenable for nonweightbearing recommendations.  PT was consulted while patient in the hospital as well.  He was also monitored on CIWA protocol given his report of alcohol use.  Alcohol level was elevated on admission, 137.

## 2020-09-17 NOTE — Assessment & Plan Note (Signed)
-  Initial CT head on admission showed small bilateral subdural hematomas measuring up to 5 mm in maximal thickness. -Neurosurgery following, appreciate assistance -CT head repeated approximately 6 hours after initial showing stable SDH -Follow-up any further neurosurgery recommendations -Currently no neuro deficits appreciated

## 2020-09-17 NOTE — Assessment & Plan Note (Signed)
-  Also considered in setting of underlying alcohol use -Follow CBC

## 2020-09-17 NOTE — Consult Note (Signed)
Neurosurgery-New Consultation Evaluation 09/17/2020 Chad Walsh [id number]  Identifying Statement: Chad Walsh is a 40 y.o. male from [place] [id number] with fall  Physician Requesting Consultation: Chad Chamber, MD  History of Present Illness: Chad Walsh is admitted to the hospital for concern of pain and swelling around his left ankle after a recent fall.  He does recall hitting his head but denies losing any consciousness.  Given the concern, a CT scan of the head was obtained in the emergency department which did show bilateral subdural collections approximate 5 mm in thickness.  Given concern for ankle injury patient is being recommended for surgical fixation.  We are consulted given the findings on the CT of the head.  Currently, he denies any headache, speech problems, weakness, numbness. He states he has been involved with contact sports in the past. He denies any other falls. He is scheduled for possible discharge tomorrow and follow up for fixation of his ankle.   Past Medical History:  Past Medical History:  Diagnosis Date   ETOH abuse     Social History: Social History   Socioeconomic History   Marital status: Single    Spouse name: Not on file   Number of children: Not on file   Years of education: Not on file   Highest education level: Not on file  Occupational History   Not on file  Tobacco Use   Smoking status: Never Smoker   Smokeless tobacco: Never Used  Vaping Use   Vaping Use: Never used  Substance and Sexual Activity   Alcohol use: Yes   Drug use: Never   Sexual activity: Not on file  Other Topics Concern   Not on file  Social History Narrative   Not on file   Social Determinants of Health   Financial Resource Strain: Not on file  Food Insecurity: Not on file  Transportation Needs: Not on file  Physical Activity: Not on file  Stress: Not on file  Social Connections: Not on file  Intimate Partner Violence: Not on file      Family History: Family History  Problem Relation Age of Onset   Lung cancer Mother     Review of Systems:  Review of Systems - General ROS: Negative Psychological ROS: Negative Ophthalmic ROS: Negative ENT ROS: Negative Hematological and Lymphatic ROS: Negative  Endocrine ROS: Negative Respiratory ROS: Negative Cardiovascular ROS: Negative Gastrointestinal ROS: Negative Genito-Urinary ROS: Negative Musculoskeletal ROS: Positive for left ankle pain Neurological ROS: Negative for headache Dermatological ROS: Negative  Physical Exam: BP 133/70 (BP Location: Left Arm)   Pulse (!) 117   Temp 98.4 F (36.9 C) (Oral)   Resp 20   Ht 5\' 8"  (1.727 m)   Wt 95.7 kg   SpO2 95%   BMI 32.08 kg/m  Body mass index is 32.08 kg/m. Body surface area is 2.14 meters squared. General appearance: Alert, cooperative, in no acute distress Head: Normocephalic, atraumatic Eyes: Normal, EOM intact Oropharynx: Wearing facemask Ext: No edema in LE bilaterally, bandage on left ankle  Neurologic exam:  Mental status: alertness: alert, orientation: person, place, stated "February" for month, affect: normal Speech: fluent and clear, naming and repetition are intact Cranial nerves:  II: Visual fields are full by confrontation, no ptosis III/IV/VI: extra-ocular motions intact bilaterally V/VII:no evidence of facial droop or weakness  VIII: hearing normal XI: trapezius strength symmetric,  sternocleidomastoid strength symmetric XII: tongue strength symmetric  Motor:strength symmetric 5/5, normal muscle mass and tone in left lower and  bilateral upper extremities and no pronator drift Sensory: intact to light touch in all extremities Gait: Not tested given ankle injury  Laboratory: Results for orders placed or performed during the hospital encounter of 09/16/20  SARS Coronavirus 2 by RT PCR (hospital order, performed in [facility] hospital lab) Nasopharyngeal Nasopharyngeal Swab   Specimen:  Nasopharyngeal Swab  Result Value Ref Range   SARS Coronavirus 2 NEGATIVE NEGATIVE  CBC with Differential/Platelet  Result Value Ref Range   WBC 9.9 4.0 - 10.5 K/uL   RBC 3.01 (L) 4.22 - 5.81 MIL/uL   Hemoglobin 10.2 (L) 13.0 - 17.0 g/dL   HCT 63.8 (L) 45.3 - 64.6 %   MCV 98.0 80.0 - 100.0 fL   MCH 33.9 26.0 - 34.0 pg   MCHC 34.6 30.0 - 36.0 g/dL   RDW 80.3 (H) 21.2 - 24.8 %   Platelets 106 (L) 150 - 400 K/uL   nRBC 0.0 0.0 - 0.2 %   Neutrophils Relative % 75 %   Neutro Abs 7.4 1.7 - 7.7 K/uL   Lymphocytes Relative 15 %   Lymphs Abs 1.5 0.7 - 4.0 K/uL   Monocytes Relative 9 %   Monocytes Absolute 0.9 0.1 - 1.0 K/uL   Eosinophils Relative 0 %   Eosinophils Absolute 0.0 0.0 - 0.5 K/uL   Basophils Relative 1 %   Basophils Absolute 0.1 0.0 - 0.1 K/uL   Immature Granulocytes 0 %   Abs Immature Granulocytes 0.03 0.00 - 0.07 K/uL  Comprehensive metabolic panel  Result Value Ref Range   Sodium 139 135 - 145 mmol/L   Potassium 2.7 (LL) 3.5 - 5.1 mmol/L   Chloride 97 (L) 98 - 111 mmol/L   CO2 28 22 - 32 mmol/L   Glucose, Bld 110 (H) 70 - 99 mg/dL   BUN <5 (L) 6 - 20 mg/dL   Creatinine, Ser 2.50 (L) 0.61 - 1.24 mg/dL   Calcium 7.8 (L) 8.9 - 10.3 mg/dL   Total Protein 6.5 6.5 - 8.1 g/dL   Albumin 3.3 (L) 3.5 - 5.0 g/dL   AST 037 (H) 15 - 41 U/L   ALT 99 (H) 0 - 44 U/L   Alkaline Phosphatase 123 38 - 126 U/L   Total Bilirubin 4.0 (H) 0.3 - 1.2 mg/dL   GFR, Estimated >04 >88 mL/min   Anion gap 14 5 - 15  Ethanol  Result Value Ref Range   Alcohol, Ethyl (B) 137 (H) <10 mg/dL  Magnesium  Result Value Ref Range   Magnesium 1.2 (L) 1.7 - 2.4 mg/dL  CBC  Result Value Ref Range   WBC 8.5 4.0 - 10.5 K/uL   RBC 3.19 (L) 4.22 - 5.81 MIL/uL   Hemoglobin 10.8 (L) 13.0 - 17.0 g/dL   HCT 89.1 (L) 69.4 - 50.3 %   MCV 97.8 80.0 - 100.0 fL   MCH 33.9 26.0 - 34.0 pg   MCHC 34.6 30.0 - 36.0 g/dL   RDW 88.8 (H) 28.0 - 03.4 %   Platelets 76 (L) 150 - 400 K/uL   nRBC 0.2 0.0 - 0.2 %  HIV  Antibody (routine testing w rflx)  Result Value Ref Range   HIV Screen 4th Generation wRfx Non Reactive Non Reactive  Comprehensive metabolic panel  Result Value Ref Range   Sodium 137 135 - 145 mmol/L   Potassium 3.1 (L) 3.5 - 5.1 mmol/L   Chloride 95 (L) 98 - 111 mmol/L   CO2 29 22 - 32 mmol/L  Glucose, Bld 92 70 - 99 mg/dL   BUN <5 (L) 6 - 20 mg/dL   Creatinine, Ser 4.48 (L) 0.61 - 1.24 mg/dL   Calcium 7.9 (L) 8.9 - 10.3 mg/dL   Total Protein 6.6 6.5 - 8.1 g/dL   Albumin 3.4 (L) 3.5 - 5.0 g/dL   AST 185 (H) 15 - 41 U/L   ALT 108 (H) 0 - 44 U/L   Alkaline Phosphatase 133 (H) 38 - 126 U/L   Total Bilirubin 6.4 (H) 0.3 - 1.2 mg/dL   GFR, Estimated >63 >14 mL/min   Anion gap 13 5 - 15  Magnesium  Result Value Ref Range   Magnesium 1.1 (L) 1.7 - 2.4 mg/dL   I personally reviewed labs  Imaging: CT head:Small bilateral mixed density subdural hematomas measuring up to 5 mm are unchanged. No new intracranial hemorrhage or acute infarct.   Impression/Plan:  Chad Walsh is here with what appears to be acute on chronic subdural collections. We discussed that this should improve without treatment over time but I did stress avoidance of falls and trauma which could worsen it. He is clear from my perspective for DVT prophylaxis if needed. He is clear for surgery. No further head imaging needed at this time.    1.  Diagnosis: Acute on chronic subdural hematoma   2.  Plan - Follow up as outpatient in 3-4 weeks

## 2020-09-17 NOTE — Progress Notes (Signed)
PROGRESS NOTE    Chad Walsh   ZOX:[id number]  DOB: 26-Dec-1980  DOA: 09/16/2020     1  PCP: Patient, No Pcp Per  CC: Left ankle pain  Hospital Course: Chad Walsh is a 40 year old male with PMH alcohol use who presented to the ER after developing pain and swelling in his left ankle.  He had stated that he fell approximately 2 days prior to admission after slipping on the snow/ice and twisting his left ankle.  He also hit his head when he fell to the ground but denied losing any consciousness. Due to no improvement in the pain, he presented for further work-up.  In the ER he underwent multiple imaging studies. CT head showed small bilateral subdural hematomas measuring up to 5 mm in maximal thickness.  He also underwent repeat CT head per neurosurgery recommendation approximately 6 hours after initial CT head which was stable.  He was seen by neurosurgery the day after admission as well, see recommendations. CT cervical spine and maxillofacial were negative for acute abnormalities or fracture.  Imaging studies of his left ankle including x-ray and CT were notable for a bimalleolar fracture/dislocation of the left ankle.  He underwent reduction of the tibiotalar joint in the ER.  He had significant swelling and also a hematoma noted on CT.  He was evaluated by orthopedic surgery with recommendations for continuing external fixation until edema improves in the next 1 to 2 weeks then pursuing definitive treatment outpatient with ORIF.  Patient was amenable for nonweightbearing recommendations.  PT was consulted while patient in the hospital as well.  He was also monitored on CIWA protocol given his report of alcohol use.  Alcohol level was elevated on admission, 137.    Interval History:  Seen sitting up in recliner this morning.  Denies any headache, vision changes, weakness, numbness, tingling.  Mostly describes having left ankle pain from his fracture and some soreness from his  fall throughout but otherwise feels like his normal self. He understands tentative plan is for keeping fixator in place with outpatient follow-up for surgery.  Old records reviewed in assessment of this patient  ROS: Constitutional: negative for chills and fevers, Respiratory: negative for cough, Cardiovascular: negative for chest pain and Gastrointestinal: negative for abdominal pain  Assessment & Plan: * Closed left ankle fracture -  S/p mechanical fall at home associated with intoxication resulting in left bimalleolar ankle fracture with tibiotalar joint dislocation which was reduced in the ER -Recommendations are for external fixation while swelling improves over the next 1 to 2 weeks per Ortho and then definitive treatment with ORIF -Patient is amenable to this plan.  He is nonweightbearing to left lower extremity -PT consulted to begin mobilizing  Subdural hematoma, post-traumatic (HCC) -Initial CT head on admission showed small bilateral subdural hematomas measuring up to 5 mm in maximal thickness. -Neurosurgery following, appreciate assistance -CT head repeated approximately 6 hours after initial showing stable SDH -Follow-up any further neurosurgery recommendations -Currently no neuro deficits appreciated  Alcohol abuse -Alcohol level 137 on admission.  States his last drink was on Saturday evening -Continue monitoring on CIWA protocol  Thrombocytopenia (HCC) -Also considered in setting of underlying alcohol use -Follow CBC  Hypokalemia -Replete and recheck as needed  Transaminitis -Considered related to alcohol use -Trend LFTs    Antimicrobials: N/A  DVT prophylaxis: SCD Code Status: Full Family Communication: None present Disposition Plan: Status is: Inpatient  Remains inpatient appropriate because:Unsafe d/c plan and Inpatient level of care appropriate  due to severity of illness   Dispo: The patient is from: Home              Anticipated d/c is to:  Home              Anticipated d/c date is: 1 day              Patient currently is not medically stable to d/c.   Difficult to place patient No  Objective: Blood pressure 133/70, pulse (!) 117, temperature 98.4 F (36.9 C), temperature source Oral, resp. rate 20, height 5\' 8"  (1.727 m), weight 95.7 kg, SpO2 95 %.  Examination: General appearance: alert, cooperative and no distress Head: Normocephalic, without obvious abnormality, atraumatic Eyes: EOMI, PERRL Lungs: clear to auscultation bilaterally Heart: regular rate and rhythm and S1, S2 normal Abdomen: normal findings: bowel sounds normal and soft, non-tender Extremities: LLE wrapped in brace; no edema noted throughout Skin: mobility and turgor normal Neurologic: Grossly normal  Consultants:   Neurosurgery  Ortho  Procedures:     Data Reviewed: I have personally reviewed following labs and imaging studies Results for orders placed or performed during the hospital encounter of 09/16/20 (from the past 24 hour(s))  CBC     Status: Abnormal   Collection Time: 09/17/20  2:34 AM  Result Value Ref Range   WBC 8.5 4.0 - 10.5 K/uL   RBC 3.19 (L) 4.22 - 5.81 MIL/uL   Hemoglobin 10.8 (L) 13.0 - 17.0 g/dL   HCT 09/19/20 (L) 30.8 - 65.7 %   MCV 97.8 80.0 - 100.0 fL   MCH 33.9 26.0 - 34.0 pg   MCHC 34.6 30.0 - 36.0 g/dL   RDW 84.6 (H) 96.2 - 95.2 %   Platelets 76 (L) 150 - 400 K/uL   nRBC 0.2 0.0 - 0.2 %  HIV Antibody (routine testing w rflx)     Status: None   Collection Time: 09/17/20  2:34 AM  Result Value Ref Range   HIV Screen 4th Generation wRfx Non Reactive Non Reactive  Comprehensive metabolic panel     Status: Abnormal   Collection Time: 09/17/20  2:34 AM  Result Value Ref Range   Sodium 137 135 - 145 mmol/L   Potassium 3.1 (L) 3.5 - 5.1 mmol/L   Chloride 95 (L) 98 - 111 mmol/L   CO2 29 22 - 32 mmol/L   Glucose, Bld 92 70 - 99 mg/dL   BUN <5 (L) 6 - 20 mg/dL   Creatinine, Ser 09/19/20 (L) 0.61 - 1.24 mg/dL   Calcium  7.9 (L) 8.9 - 10.3 mg/dL   Total Protein 6.6 6.5 - 8.1 g/dL   Albumin 3.4 (L) 3.5 - 5.0 g/dL   AST 3.24 (H) 15 - 41 U/L   ALT 108 (H) 0 - 44 U/L   Alkaline Phosphatase 133 (H) 38 - 126 U/L   Total Bilirubin 6.4 (H) 0.3 - 1.2 mg/dL   GFR, Estimated 401 >02 mL/min   Anion gap 13 5 - 15  Magnesium     Status: Abnormal   Collection Time: 09/17/20  2:34 AM  Result Value Ref Range   Magnesium 1.1 (L) 1.7 - 2.4 mg/dL    Recent Results (from the past 240 hour(s))  SARS Coronavirus 2 by RT PCR (hospital order, performed in [facility] hospital lab) Nasopharyngeal Nasopharyngeal Swab     Status: None   Collection Time: 09/16/20  8:54 AM   Specimen: Nasopharyngeal Swab  Result Value Ref Range Status  SARS Coronavirus 2 NEGATIVE NEGATIVE Final    Comment: (NOTE) SARS-CoV-2 target nucleic acids are NOT DETECTED.  The SARS-CoV-2 RNA is generally detectable in upper and lower respiratory specimens during the acute phase of infection. The lowest concentration of SARS-CoV-2 viral copies this assay can detect is 250 copies / mL. A negative result does not preclude SARS-CoV-2 infection and should not be used as the sole basis for treatment or other patient management decisions.  A negative result may occur with improper specimen collection / handling, submission of specimen other than nasopharyngeal swab, presence of viral mutation(s) within the areas targeted by this assay, and inadequate number of viral copies (<250 copies / mL). A negative result must be combined with clinical observations, patient history, and epidemiological information.  Fact Sheet for Patients:   BoilerBrush.com.cyhttps://www.fda.gov/media/136312/download  Fact Sheet for Healthcare Providers: https://pope.com/https://www.fda.gov/media/136313/download  This test is not yet approved or  cleared by the [place] FDA and has been authorized for detection and/or diagnosis of SARS-CoV-2 by FDA under an Emergency Use Authorization (EUA).  This EUA will  remain in effect (meaning this test can be used) for the duration of the COVID-19 declaration under Section 564(b)(1) of the Act, 21 U.S.C. section 360bbb-3(b)(1), unless the authorization is terminated or revoked sooner.  Performed at [facility], [address] [place], [place] [address]      Radiology Studies: DG Ankle Complete Left  Result Date: 09/16/2020 CLINICAL DATA:  Patient status post reduction of the left ankle. EXAM: LEFT ANKLE COMPLETE - 3+ VIEW COMPARISON:  Left ankle radiographs earlier same day. FINDINGS: Overlying casting material. Improved alignment of the oblique fracture of the distal fibula. There is improved alignment of the tibiotalar joint with mild lateral dislocation of the talus relative to the distal tibia. IMPRESSION: Interval casting with improved alignment of the tibiotalar joint and of the distal fibular fracture. Despite the interval reduction however there is still mild lateral dislocation of the talus relative to the distal tibia as well as offset of the distal fibular fracture. Electronically Signed   By: Annia Beltrew  Davis M.D.   On: 09/16/2020 07:59   DG Ankle Complete Left  Result Date: 09/16/2020 CLINICAL DATA:  Left ankle pain after slip and fall injury 2 days ago. EXAM: LEFT ANKLE COMPLETE - 3+ VIEW COMPARISON:  None. FINDINGS: Fracture dislocation of the left ankle. There is an oblique fracture of the distal fibula with 4 mm lateral displacement and 13 mm overriding of the distal fracture fragment. Lateral dislocation of the talus with respect to the tibia of about 1.8 cm. Tiny bone fragment medial to the talus likely represents a ligamentous avulsion fragment. Diffuse soft tissue swelling. IMPRESSION: Fracture dislocation of the left ankle with lateral dislocation of the talus with respect to the tibia. Electronically Signed   By: Burman NievesWilliam  Stevens M.D.   On: 09/16/2020 01:32   CT Head Wo Contrast  Result Date: 09/16/2020 CLINICAL DATA:  Head  trauma, mod-severe repeat 6 hours for 3pm EXAM: CT HEAD WITHOUT CONTRAST TECHNIQUE: Contiguous axial images were obtained from the base of the skull through the vertex without intravenous contrast. COMPARISON:  09/16/2020. FINDINGS: Brain: Trace mixed density bilateral subdural collections also tracking along the tentorium are grossly unchanged in size measuring up to 5 mm. No new site of intracranial hemorrhage. No acute infarct. No midline shift or ventriculomegaly. No mass lesion. Vascular: No hyperdense vessel or unexpected calcification. Skull: No acute finding. Sinuses/Orbits: No acute orbital finding. Pneumatized paranasal sinuses. Other: None.  IMPRESSION: Small bilateral mixed density subdural hematomas measuring up to 5 mm are unchanged. No new intracranial hemorrhage or acute infarct. Electronically Signed   By: Stana Bunting M.D.   On: 09/16/2020 15:35   CT Head Wo Contrast  Result Date: 09/16/2020 CLINICAL DATA:  Facial trauma related to fall 2 days ago EXAM: CT HEAD WITHOUT CONTRAST CT MAXILLOFACIAL WITHOUT CONTRAST CT CERVICAL SPINE WITHOUT CONTRAST TECHNIQUE: Multidetector CT imaging of the head, cervical spine, and maxillofacial structures were performed using the standard protocol without intravenous contrast. Multiplanar CT image reconstructions of the cervical spine and maxillofacial structures were also generated. COMPARISON:  None. FINDINGS: CT HEAD FINDINGS Brain: Trace high-density thickening of the right tentorium, 2 mm in maximum. Mixed density subdural collections along the bilateral cerebral convexity, on the left measuring up to 5 mm in thickness at a level where there is both low and high density. On the right subdural collection is mainly low-density with a few wispy high-density areas anteriorly and in the midline. No parenchymal hemorrhage, hydrocephalus, or infarction. Vascular: No hyperdense vessel or unexpected calcification. Skull: Negative for fracture CT MAXILLOFACIAL  FINDINGS Osseous: No fracture or mandibular dislocation. Orbits: No evidence of injury Sinuses: Negative for hemosinus Soft tissues: No acute finding.  Fatty atrophy of the left parotid. CT CERVICAL SPINE FINDINGS Alignment: Normal Skull base and vertebrae: No acute fracture Soft tissues and spinal canal: No prevertebral fluid or swelling. No visible canal hematoma. Disc levels:  No significant degenerative changes Upper chest: No visible injury Critical Value/emergent results were called by telephone at the time of interpretation on 09/16/2020 at 8:36 am to provider Dr Alfred Levins, who verbally acknowledged these results. IMPRESSION: 1. Small bilateral subdural hematomas measuring up to 5 mm in maximal thickness. The collections are mixed density but include high-density acute appearing components. 2. Negative for facial or cervical spine fracture. Electronically Signed   By: Marnee Spring M.D.   On: 09/16/2020 08:41   CT Cervical Spine Wo Contrast  Result Date: 09/16/2020 CLINICAL DATA:  Facial trauma related to fall 2 days ago EXAM: CT HEAD WITHOUT CONTRAST CT MAXILLOFACIAL WITHOUT CONTRAST CT CERVICAL SPINE WITHOUT CONTRAST TECHNIQUE: Multidetector CT imaging of the head, cervical spine, and maxillofacial structures were performed using the standard protocol without intravenous contrast. Multiplanar CT image reconstructions of the cervical spine and maxillofacial structures were also generated. COMPARISON:  None. FINDINGS: CT HEAD FINDINGS Brain: Trace high-density thickening of the right tentorium, 2 mm in maximum. Mixed density subdural collections along the bilateral cerebral convexity, on the left measuring up to 5 mm in thickness at a level where there is both low and high density. On the right subdural collection is mainly low-density with a few wispy high-density areas anteriorly and in the midline. No parenchymal hemorrhage, hydrocephalus, or infarction. Vascular: No hyperdense vessel or unexpected  calcification. Skull: Negative for fracture CT MAXILLOFACIAL FINDINGS Osseous: No fracture or mandibular dislocation. Orbits: No evidence of injury Sinuses: Negative for hemosinus Soft tissues: No acute finding.  Fatty atrophy of the left parotid. CT CERVICAL SPINE FINDINGS Alignment: Normal Skull base and vertebrae: No acute fracture Soft tissues and spinal canal: No prevertebral fluid or swelling. No visible canal hematoma. Disc levels:  No significant degenerative changes Upper chest: No visible injury Critical Value/emergent results were called by telephone at the time of interpretation on 09/16/2020 at 8:36 am to provider Dr Alfred Levins, who verbally acknowledged these results. IMPRESSION: 1. Small bilateral subdural hematomas measuring up to 5 mm in maximal thickness. The  collections are mixed density but include high-density acute appearing components. 2. Negative for facial or cervical spine fracture. Electronically Signed   By: Marnee Spring M.D.   On: 09/16/2020 08:41   CT Ankle Left Wo Contrast  Result Date: 09/16/2020 CLINICAL DATA:  Evaluate left ankle fracture EXAM: CT OF THE LEFT ANKLE WITHOUT CONTRAST TECHNIQUE: Multidetector CT imaging of the left ankle was performed according to the standard protocol. Multiplanar CT image reconstructions were also generated. COMPARISON:  09/16/2020 FINDINGS: Bones/Joint/Cartilage Spiral fracture of the distal fibular metaphysis with 3 mm of lateral displacement. 5.0 cm butterfly fracture fragment along the medial fracture margin with slight medial displacement. Widening of the distal tibiofibular joint compatible with syndesmotic injury. Lateral malleolus remains aligned with the lateral talus. Comminuted minimally displaced posterior malleolar fracture (series 7, images 50-63) with multiple small intra-articular fracture fragments at the posterior aspect of the tibiotalar joint. Previously seen tibiotalar joint dislocation has been reduced with improved alignment.  Persistent lateral translation of the talar dome relative to the tibia with widening of the medial clear space. There are a few tiny ossific densities inferior to the tip of the medial malleolus likely representing tiny cortical avulsions related to anterior and posterior talofibular ligament injuries. Subtalar joints aligned. Osseous alignment of the midfoot remains anatomic. TMT joints intact. No fractures evident within the midfoot or visualized forefoot. Ligaments Suboptimally assessed by CT. Muscles and Tendons No acute musculotendinous injury evident by CT. Soft tissues Circumferential soft tissue swelling about the ankle. There are scattered foci of increased density within the subcutaneous soft tissues overlying the medial aspect of the distal tibial metaphysis compatible with hematoma. Somewhat ill-defined collection measures approximately 4.0 x 1.0 x 3.5 cm (series 9, image 171; series 8, image 132). IMPRESSION: 1. Bimalleolar fracture-dislocation of the left ankle as described above. 2. Interval reduction of tibiotalar joint dislocation with improved alignment. Persistent lateral translation of the talar dome relative to the tibia with widening of the medial clear space. 3. Several tiny ossific densities inferior to the tip of the medial malleolus likely representing tiny cortical avulsions related to anterior and posterior talofibular ligament injuries. 4. Scattered foci of increased density within the subcutaneous soft tissues overlying the medial aspect of the distal tibial metaphysis compatible with hematoma. Somewhat ill-defined collection measures approximately 4.0 x 1.0 x 3.5 cm. Electronically Signed   By: Duanne Guess D.O.   On: 09/16/2020 09:17   CT Maxillofacial Wo Contrast  Result Date: 09/16/2020 CLINICAL DATA:  Facial trauma related to fall 2 days ago EXAM: CT HEAD WITHOUT CONTRAST CT MAXILLOFACIAL WITHOUT CONTRAST CT CERVICAL SPINE WITHOUT CONTRAST TECHNIQUE: Multidetector CT  imaging of the head, cervical spine, and maxillofacial structures were performed using the standard protocol without intravenous contrast. Multiplanar CT image reconstructions of the cervical spine and maxillofacial structures were also generated. COMPARISON:  None. FINDINGS: CT HEAD FINDINGS Brain: Trace high-density thickening of the right tentorium, 2 mm in maximum. Mixed density subdural collections along the bilateral cerebral convexity, on the left measuring up to 5 mm in thickness at a level where there is both low and high density. On the right subdural collection is mainly low-density with a few wispy high-density areas anteriorly and in the midline. No parenchymal hemorrhage, hydrocephalus, or infarction. Vascular: No hyperdense vessel or unexpected calcification. Skull: Negative for fracture CT MAXILLOFACIAL FINDINGS Osseous: No fracture or mandibular dislocation. Orbits: No evidence of injury Sinuses: Negative for hemosinus Soft tissues: No acute finding.  Fatty atrophy of the left parotid.  CT CERVICAL SPINE FINDINGS Alignment: Normal Skull base and vertebrae: No acute fracture Soft tissues and spinal canal: No prevertebral fluid or swelling. No visible canal hematoma. Disc levels:  No significant degenerative changes Upper chest: No visible injury Critical Value/emergent results were called by telephone at the time of interpretation on 09/16/2020 at 8:36 am to provider Dr Alfred Levins, who verbally acknowledged these results. IMPRESSION: 1. Small bilateral subdural hematomas measuring up to 5 mm in maximal thickness. The collections are mixed density but include high-density acute appearing components. 2. Negative for facial or cervical spine fracture. Electronically Signed   By: Marnee Spring M.D.   On: 09/16/2020 08:41   CT Head Wo Contrast  Final Result    CT Head Wo Contrast  Final Result    CT Cervical Spine Wo Contrast  Final Result    CT Maxillofacial Wo Contrast  Final Result    CT Ankle  Left Wo Contrast  Final Result    DG Ankle Complete Left  Final Result    DG Ankle Complete Left  Final Result      Scheduled Meds: . cloNIDine  0.05 mg Oral Daily  . LORazepam  0-4 mg Intravenous Q6H   Or  . LORazepam  0-4 mg Oral Q6H  . [START ON 09/18/2020] LORazepam  0-4 mg Intravenous Q12H   Or  . [START ON 09/18/2020] LORazepam  0-4 mg Oral Q12H  . sodium chloride flush  3 mL Intravenous Q12H  . thiamine  100 mg Oral Daily   Or  . thiamine  100 mg Intravenous Daily   PRN Meds: morphine injection, ondansetron (ZOFRAN) IV Continuous Infusions: .  ceFAZolin (ANCEF) IV Stopped (09/16/20 0843)     LOS: 1 day  Time spent: Greater than 50% of the 35 minute visit was spent in counseling/coordination of care for the patient as laid out in the A&P.   Lewie Chamber, MD [facility] 09/17/2020, 1:45 PM

## 2020-09-18 DIAGNOSIS — R7401 Elevation of levels of liver transaminase levels: Secondary | ICD-10-CM

## 2020-09-18 LAB — CBC WITH DIFFERENTIAL/PLATELET
Abs Immature Granulocytes: 0.06 10*3/uL (ref 0.00–0.07)
Basophils Absolute: 0 10*3/uL (ref 0.0–0.1)
Basophils Relative: 0 %
Eosinophils Absolute: 0.1 10*3/uL (ref 0.0–0.5)
Eosinophils Relative: 2 %
HCT: 30.2 % — ABNORMAL LOW (ref 39.0–52.0)
Hemoglobin: 10.2 g/dL — ABNORMAL LOW (ref 13.0–17.0)
Immature Granulocytes: 1 %
Lymphocytes Relative: 17 %
Lymphs Abs: 1.4 10*3/uL (ref 0.7–4.0)
MCH: 34.5 pg — ABNORMAL HIGH (ref 26.0–34.0)
MCHC: 33.8 g/dL (ref 30.0–36.0)
MCV: 102 fL — ABNORMAL HIGH (ref 80.0–100.0)
Monocytes Absolute: 0.9 10*3/uL (ref 0.1–1.0)
Monocytes Relative: 11 %
Neutro Abs: 5.6 10*3/uL (ref 1.7–7.7)
Neutrophils Relative %: 69 %
Platelets: 70 10*3/uL — ABNORMAL LOW (ref 150–400)
RBC: 2.96 MIL/uL — ABNORMAL LOW (ref 4.22–5.81)
RDW: 18.1 % — ABNORMAL HIGH (ref 11.5–15.5)
WBC: 8.1 10*3/uL (ref 4.0–10.5)
nRBC: 0.4 % — ABNORMAL HIGH (ref 0.0–0.2)

## 2020-09-18 LAB — COMPREHENSIVE METABOLIC PANEL
ALT: 103 U/L — ABNORMAL HIGH (ref 0–44)
AST: 264 U/L — ABNORMAL HIGH (ref 15–41)
Albumin: 3 g/dL — ABNORMAL LOW (ref 3.5–5.0)
Alkaline Phosphatase: 122 U/L (ref 38–126)
Anion gap: 16 — ABNORMAL HIGH (ref 5–15)
BUN: 6 mg/dL (ref 6–20)
CO2: 26 mmol/L (ref 22–32)
Calcium: 7.6 mg/dL — ABNORMAL LOW (ref 8.9–10.3)
Chloride: 94 mmol/L — ABNORMAL LOW (ref 98–111)
Creatinine, Ser: 0.56 mg/dL — ABNORMAL LOW (ref 0.61–1.24)
GFR, Estimated: >60 mL/min (ref >60)
Glucose, Bld: 79 mg/dL (ref 70–99)
Potassium: 3.3 mmol/L — ABNORMAL LOW (ref 3.5–5.1)
Sodium: 136 mmol/L (ref 135–145)
Total Bilirubin: 6.8 mg/dL — ABNORMAL HIGH (ref 0.3–1.2)
Total Protein: 6.2 g/dL — ABNORMAL LOW (ref 6.5–8.1)

## 2020-09-18 LAB — MAGNESIUM: Magnesium: 1.1 mg/dL — ABNORMAL LOW (ref 1.7–2.4)

## 2020-09-18 MED ORDER — POTASSIUM CHLORIDE CRYS ER 20 MEQ PO TBCR
40.0000 meq | EXTENDED_RELEASE_TABLET | Freq: Two times a day (BID) | ORAL | Status: DC
  Administered 2020-09-18 – 2020-09-20 (×5): 40 meq via ORAL
  Filled 2020-09-18 (×5): qty 2

## 2020-09-18 MED ORDER — MAGNESIUM SULFATE 4 GM/100ML IV SOLN
4.0000 g | Freq: Once | INTRAVENOUS | Status: AC
  Administered 2020-09-18: 4 g via INTRAVENOUS
  Filled 2020-09-18: qty 100

## 2020-09-18 NOTE — Progress Notes (Signed)
PROGRESS NOTE    Krystal ClarkMarcus J Breiner  WUJ:[id number] DOB: 07-Oct-1980 DOA: 09/16/2020 PCP: Patient, No Pcp Per    Assessment & Plan:   Principal Problem:   Closed left ankle fracture Active Problems:   Alcohol abuse   Transaminitis   Hypokalemia   Subdural hematoma, post-traumatic (HCC)   Thrombocytopenia (HCC)  Closed left ankle fracture: s/p mechanical fall at home secondary to alcohol intoxication. Will continue w/ temporary fixation w/ external fixator w/ eventual conversion to ORIF that will not be this inpatient stay as per ortho surg. 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 Will need to f/u outpatient in 2-4 weeks w/ neurosurgery (Dr. Adriana Simasook)  Alcohol abuse: continue on CIWA protocol. Alcohol cessation counseling  Macrocytic anemia: secondary to alcohol abuse. No need for transfusion currently    Thrombocytopenia: likely secondary to alcohol abuse  Hypokalemia: KCl repleted. Will continue to monitor   Hypomagnesemia: Mg sulfate ordered. Will continue to monitor   Transaminitis: likely secondary to alcohol abuse   Obesity: BMI 32.0. Complicates overall care & prognosis    DVT prophylaxis: lovenox  Code Status: full  Family Communication:  Disposition Plan: likely d/c home w/ home health   Status is: Inpatient  Remains inpatient appropriate because:Ongoing diagnostic testing needed not appropriate for outpatient work up, IV treatments appropriate due to intensity of illness or inability to take PO and Inpatient level of care appropriate due to severity of illness   Dispo: The patient is from: Home              Anticipated d/c is to: Home              Anticipated d/c date is: 3 days              Patient currently is not medically stable to d/c.   Difficult to place patient No     Consultants:   Ortho surg  neurosurg   Procedures:    Antimicrobials:    Subjective: Pt c/o  left ankle pain   Objective: Vitals:   09/17/20 1718 09/17/20 2124 09/18/20 0028 09/18/20 0544  BP: (!) 150/92 131/66 123/72 (!) 146/89  Pulse: (!) 127 (!) 109 (!) 114 (!) 106  Resp: 20 16 17 16   Temp: 98.6 F (37 C) 98.8 F (37.1 C) 99.7 F (37.6 C) 98.9 F (37.2 C)  TempSrc:  Oral Oral Oral  SpO2: 97% 95% 94% 97%  Weight:      Height:        Intake/Output Summary (Last 24 hours) at 09/18/2020 0746 Last data filed at 09/18/2020 0538 Gross per 24 hour  Intake 777.92 ml  Output 675 ml  Net 102.92 ml   Filed Weights   09/16/20 0050 09/17/20 0025  Weight: 94.3 kg 95.7 kg    Examination:  General exam: Appears calm and comfortable  Respiratory system: Clear to auscultation. Respiratory effort normal. Cardiovascular system: S1 & S2+. No rubs, gallops or clicks Gastrointestinal system: Abdomen is obese, soft and nontender. Normal bowel sounds heard. Central nervous system: Alert and oriented. Moves all extremities  Psychiatry: Judgement and insight appear normal. Mood & affect appropriate.     Data Reviewed: I have personally reviewed following labs and imaging studies  CBC: Recent Labs  Lab 09/16/20 0854 09/17/20 0234 09/18/20 0511  WBC 9.9 8.5 8.1  NEUTROABS 7.4  --  5.6  HGB 10.2* 10.8* 10.2*  HCT 29.5* 31.2* 30.2*  MCV 98.0  97.8 102.0*  PLT 106* 76* 70*   Basic Metabolic Panel: Recent Labs  Lab 09/16/20 0854 09/17/20 0234 09/18/20 0511  NA 139 137 136  K 2.7* 3.1* 3.3*  CL 97* 95* 94*  CO2 28 29 26   GLUCOSE 110* 92 79  BUN <5* <5* 6  CREATININE 0.50* 0.60* 0.56*  CALCIUM 7.8* 7.9* 7.6*  MG 1.2* 1.1* 1.1*   GFR: Estimated Creatinine Clearance: 141.8 mL/min (A) (by C-G formula based on SCr of 0.56 mg/dL (L)). Liver Function Tests: Recent Labs  Lab 09/16/20 0854 09/17/20 0234 09/18/20 0511  AST 256* 302* 264*  ALT 99* 108* 103*  ALKPHOS 123 133* 122  BILITOT 4.0* 6.4* 6.8*  PROT 6.5 6.6 6.2*  ALBUMIN 3.3* 3.4* 3.0*   No results for  input(s): LIPASE, AMYLASE in the last 168 hours. No results for input(s): AMMONIA in the last 168 hours. Coagulation Profile: No results for input(s): INR, PROTIME in the last 168 hours. Cardiac Enzymes: No results for input(s): CKTOTAL, CKMB, CKMBINDEX, TROPONINI in the last 168 hours. BNP (last 3 results) No results for input(s): PROBNP in the last 8760 hours. HbA1C: No results for input(s): HGBA1C in the last 72 hours. CBG: No results for input(s): GLUCAP in the last 168 hours. Lipid Profile: No results for input(s): CHOL, HDL, LDLCALC, TRIG, CHOLHDL, LDLDIRECT in the last 72 hours. Thyroid Function Tests: No results for input(s): TSH, T4TOTAL, FREET4, T3FREE, THYROIDAB in the last 72 hours. Anemia Panel: No results for input(s): VITAMINB12, FOLATE, FERRITIN, TIBC, IRON, RETICCTPCT in the last 72 hours. Sepsis Labs: No results for input(s): PROCALCITON, LATICACIDVEN in the last 168 hours.  Recent Results (from the past 240 hour(s))  SARS Coronavirus 2 by RT PCR (hospital order, performed in [facility] hospital lab) Nasopharyngeal Nasopharyngeal Swab     Status: None   Collection Time: 09/16/20  8:54 AM   Specimen: Nasopharyngeal Swab  Result Value Ref Range Status   SARS Coronavirus 2 NEGATIVE NEGATIVE Final    Comment: (NOTE) SARS-CoV-2 target nucleic acids are NOT DETECTED.  The SARS-CoV-2 RNA is generally detectable in upper and lower respiratory specimens during the acute phase of infection. The lowest concentration of SARS-CoV-2 viral copies this assay can detect is 250 copies / mL. A negative result does not preclude SARS-CoV-2 infection and should not be used as the sole basis for treatment or other patient management decisions.  A negative result may occur with improper specimen collection / handling, submission of specimen other than nasopharyngeal swab, presence of viral mutation(s) within the areas targeted by this assay, and inadequate number of viral  copies (<250 copies / mL). A negative result must be combined with clinical observations, patient history, and epidemiological information.  Fact Sheet for Patients:   09/18/20  Fact Sheet for Healthcare Providers: BoilerBrush.com.cy  This test is not yet approved or  cleared by the https://pope.com/ FDA and has been authorized for detection and/or diagnosis of SARS-CoV-2 by FDA under an Emergency Use Authorization (EUA).  This EUA will remain in effect (meaning this test can be used) for the duration of the COVID-19 declaration under Section 564(b)(1) of the Act, 21 U.S.C. section 360bbb-3(b)(1), unless the authorization is terminated or revoked sooner.  Performed at [facility], [address] [place], [place]          Radiology Studies: CT Head Wo Contrast  Result Date: 09/16/2020 CLINICAL DATA:  Head trauma, mod-severe repeat 6 hours for 3pm EXAM: CT HEAD WITHOUT CONTRAST  TECHNIQUE: Contiguous axial images were obtained from the base of the skull through the vertex without intravenous contrast. COMPARISON:  09/16/2020. FINDINGS: Brain: Trace mixed density bilateral subdural collections also tracking along the tentorium are grossly unchanged in size measuring up to 5 mm. No new site of intracranial hemorrhage. No acute infarct. No midline shift or ventriculomegaly. No mass lesion. Vascular: No hyperdense vessel or unexpected calcification. Skull: No acute finding. Sinuses/Orbits: No acute orbital finding. Pneumatized paranasal sinuses. Other: None. IMPRESSION: Small bilateral mixed density subdural hematomas measuring up to 5 mm are unchanged. No new intracranial hemorrhage or acute infarct. Electronically Signed   By: Stana Bunting M.D.   On: 09/16/2020 15:35   CT Head Wo Contrast  Result Date: 09/16/2020 CLINICAL DATA:  Facial trauma related to fall 2 days ago EXAM: CT HEAD WITHOUT CONTRAST CT  MAXILLOFACIAL WITHOUT CONTRAST CT CERVICAL SPINE WITHOUT CONTRAST TECHNIQUE: Multidetector CT imaging of the head, cervical spine, and maxillofacial structures were performed using the standard protocol without intravenous contrast. Multiplanar CT image reconstructions of the cervical spine and maxillofacial structures were also generated. COMPARISON:  None. FINDINGS: CT HEAD FINDINGS Brain: Trace high-density thickening of the right tentorium, 2 mm in maximum. Mixed density subdural collections along the bilateral cerebral convexity, on the left measuring up to 5 mm in thickness at a level where there is both low and high density. On the right subdural collection is mainly low-density with a few wispy high-density areas anteriorly and in the midline. No parenchymal hemorrhage, hydrocephalus, or infarction. Vascular: No hyperdense vessel or unexpected calcification. Skull: Negative for fracture CT MAXILLOFACIAL FINDINGS Osseous: No fracture or mandibular dislocation. Orbits: No evidence of injury Sinuses: Negative for hemosinus Soft tissues: No acute finding.  Fatty atrophy of the left parotid. CT CERVICAL SPINE FINDINGS Alignment: Normal Skull base and vertebrae: No acute fracture Soft tissues and spinal canal: No prevertebral fluid or swelling. No visible canal hematoma. Disc levels:  No significant degenerative changes Upper chest: No visible injury Critical Value/emergent results were called by telephone at the time of interpretation on 09/16/2020 at 8:36 am to provider Dr Alfred Levins, who verbally acknowledged these results. IMPRESSION: 1. Small bilateral subdural hematomas measuring up to 5 mm in maximal thickness. The collections are mixed density but include high-density acute appearing components. 2. Negative for facial or cervical spine fracture. Electronically Signed   By: Marnee Spring M.D.   On: 09/16/2020 08:41   CT Cervical Spine Wo Contrast  Result Date: 09/16/2020 CLINICAL DATA:  Facial trauma related  to fall 2 days ago EXAM: CT HEAD WITHOUT CONTRAST CT MAXILLOFACIAL WITHOUT CONTRAST CT CERVICAL SPINE WITHOUT CONTRAST TECHNIQUE: Multidetector CT imaging of the head, cervical spine, and maxillofacial structures were performed using the standard protocol without intravenous contrast. Multiplanar CT image reconstructions of the cervical spine and maxillofacial structures were also generated. COMPARISON:  None. FINDINGS: CT HEAD FINDINGS Brain: Trace high-density thickening of the right tentorium, 2 mm in maximum. Mixed density subdural collections along the bilateral cerebral convexity, on the left measuring up to 5 mm in thickness at a level where there is both low and high density. On the right subdural collection is mainly low-density with a few wispy high-density areas anteriorly and in the midline. No parenchymal hemorrhage, hydrocephalus, or infarction. Vascular: No hyperdense vessel or unexpected calcification. Skull: Negative for fracture CT MAXILLOFACIAL FINDINGS Osseous: No fracture or mandibular dislocation. Orbits: No evidence of injury Sinuses: Negative for hemosinus Soft tissues: No acute finding.  Fatty atrophy of the  left parotid. CT CERVICAL SPINE FINDINGS Alignment: Normal Skull base and vertebrae: No acute fracture Soft tissues and spinal canal: No prevertebral fluid or swelling. No visible canal hematoma. Disc levels:  No significant degenerative changes Upper chest: No visible injury Critical Value/emergent results were called by telephone at the time of interpretation on 09/16/2020 at 8:36 am to provider Dr Alfred LevinsFunk, who verbally acknowledged these results. IMPRESSION: 1. Small bilateral subdural hematomas measuring up to 5 mm in maximal thickness. The collections are mixed density but include high-density acute appearing components. 2. Negative for facial or cervical spine fracture. Electronically Signed   By: Marnee SpringJonathon  Watts M.D.   On: 09/16/2020 08:41   CT Ankle Left Wo Contrast  Result Date:  09/16/2020 CLINICAL DATA:  Evaluate left ankle fracture EXAM: CT OF THE LEFT ANKLE WITHOUT CONTRAST TECHNIQUE: Multidetector CT imaging of the left ankle was performed according to the standard protocol. Multiplanar CT image reconstructions were also generated. COMPARISON:  09/16/2020 FINDINGS: Bones/Joint/Cartilage Spiral fracture of the distal fibular metaphysis with 3 mm of lateral displacement. 5.0 cm butterfly fracture fragment along the medial fracture margin with slight medial displacement. Widening of the distal tibiofibular joint compatible with syndesmotic injury. Lateral malleolus remains aligned with the lateral talus. Comminuted minimally displaced posterior malleolar fracture (series 7, images 50-63) with multiple small intra-articular fracture fragments at the posterior aspect of the tibiotalar joint. Previously seen tibiotalar joint dislocation has been reduced with improved alignment. Persistent lateral translation of the talar dome relative to the tibia with widening of the medial clear space. There are a few tiny ossific densities inferior to the tip of the medial malleolus likely representing tiny cortical avulsions related to anterior and posterior talofibular ligament injuries. Subtalar joints aligned. Osseous alignment of the midfoot remains anatomic. TMT joints intact. No fractures evident within the midfoot or visualized forefoot. Ligaments Suboptimally assessed by CT. Muscles and Tendons No acute musculotendinous injury evident by CT. Soft tissues Circumferential soft tissue swelling about the ankle. There are scattered foci of increased density within the subcutaneous soft tissues overlying the medial aspect of the distal tibial metaphysis compatible with hematoma. Somewhat ill-defined collection measures approximately 4.0 x 1.0 x 3.5 cm (series 9, image 171; series 8, image 132). IMPRESSION: 1. Bimalleolar fracture-dislocation of the left ankle as described above. 2. Interval reduction of  tibiotalar joint dislocation with improved alignment. Persistent lateral translation of the talar dome relative to the tibia with widening of the medial clear space. 3. Several tiny ossific densities inferior to the tip of the medial malleolus likely representing tiny cortical avulsions related to anterior and posterior talofibular ligament injuries. 4. Scattered foci of increased density within the subcutaneous soft tissues overlying the medial aspect of the distal tibial metaphysis compatible with hematoma. Somewhat ill-defined collection measures approximately 4.0 x 1.0 x 3.5 cm. Electronically Signed   By: Duanne GuessNicholas  Plundo D.O.   On: 09/16/2020 09:17   CT Maxillofacial Wo Contrast  Result Date: 09/16/2020 CLINICAL DATA:  Facial trauma related to fall 2 days ago EXAM: CT HEAD WITHOUT CONTRAST CT MAXILLOFACIAL WITHOUT CONTRAST CT CERVICAL SPINE WITHOUT CONTRAST TECHNIQUE: Multidetector CT imaging of the head, cervical spine, and maxillofacial structures were performed using the standard protocol without intravenous contrast. Multiplanar CT image reconstructions of the cervical spine and maxillofacial structures were also generated. COMPARISON:  None. FINDINGS: CT HEAD FINDINGS Brain: Trace high-density thickening of the right tentorium, 2 mm in maximum. Mixed density subdural collections along the bilateral cerebral convexity, on the left measuring up  to 5 mm in thickness at a level where there is both low and high density. On the right subdural collection is mainly low-density with a few wispy high-density areas anteriorly and in the midline. No parenchymal hemorrhage, hydrocephalus, or infarction. Vascular: No hyperdense vessel or unexpected calcification. Skull: Negative for fracture CT MAXILLOFACIAL FINDINGS Osseous: No fracture or mandibular dislocation. Orbits: No evidence of injury Sinuses: Negative for hemosinus Soft tissues: No acute finding.  Fatty atrophy of the left parotid. CT CERVICAL SPINE  FINDINGS Alignment: Normal Skull base and vertebrae: No acute fracture Soft tissues and spinal canal: No prevertebral fluid or swelling. No visible canal hematoma. Disc levels:  No significant degenerative changes Upper chest: No visible injury Critical Value/emergent results were called by telephone at the time of interpretation on 09/16/2020 at 8:36 am to provider Dr Alfred Levins, who verbally acknowledged these results. IMPRESSION: 1. Small bilateral subdural hematomas measuring up to 5 mm in maximal thickness. The collections are mixed density but include high-density acute appearing components. 2. Negative for facial or cervical spine fracture. Electronically Signed   By: Marnee Spring M.D.   On: 09/16/2020 08:41        Scheduled Meds: . cloNIDine  0.05 mg Oral Daily  . enoxaparin (LOVENOX) injection  40 mg Subcutaneous Daily  . LORazepam  0-4 mg Intravenous Q12H   Or  . LORazepam  0-4 mg Oral Q12H  . pantoprazole  40 mg Oral Daily  . potassium chloride  40 mEq Oral BID  . sodium chloride flush  3 mL Intravenous Q12H  . thiamine  100 mg Oral Daily   Or  . thiamine  100 mg Intravenous Daily   Continuous Infusions: . sodium chloride 75 mL/hr at 09/18/20 0538  .  ceFAZolin (ANCEF) IV Stopped (09/16/20 0843)  . magnesium sulfate bolus IVPB       LOS: 2 days    Time spent: 34 mins     Charise Killian, MD [facility] Pager [phone]  If 7PM-7AM, please contact night-coverage 09/18/2020, 7:46 AM

## 2020-09-18 NOTE — Progress Notes (Addendum)
Physical Therapy Treatment Patient Details Name: Chad Walsh MRN: [id number] DOB: April 27, 1981 Today's Date: 09/18/2020    History of Present Illness Chad Walsh is a 40 y.o. male who had a fall 2 days ago.  He slipped and fell on ice, twisting his ankle.  This occurred while he was intoxicated from alcohol.  Of note, he has a significant history of alcohol abuse.  The patient states that he lives alone, but his father can come to help.  He is currently unemployed as he quit his job from [employer].  X-rays in the emergency department showed a lateral malleolus fracture with significant displacement and disruption of the ankle mortise.  He underwent a closed reduction and splint placement in the emergency department.  A CT scan was then obtained. Of note, the patient was also found to have a subdural hematoma, and repeat CT scan will be obtained.    PT Comments    Patient sitting up eating breakfast on arrival to room. Patient agreeable to PT and has increased activity tolerance this session. Heart rate 113bpm and Sp02 96% on room air prior to mobilizing. Gait training performed 20 ft x 2 bouts with rolling walker with Min guard initially progressing to supervision. Although patient has increased independence with activity this session, patient has difficulty maintaining NWB of LLE with increased ambulation distance due to fatigue. Verbal and visual cues for education provided for proper techniques to maintain NWB during functional transfers and during ambulation. Recommend to continue PT to maximize independence and address remaining functional limitations in preparation for home.     Follow Up Recommendations  Home health PT;Supervision for mobility/OOB     Equipment Recommendations  Rolling walker with 5" wheels;3in1 (PT);Wheelchair (measurements PT);Wheelchair cushion (measurements PT)    Recommendations for Other Services       Precautions / Restrictions  Precautions Precautions: Fall Required Braces or Orthoses: Splint/Cast Splint/Cast: LLE Restrictions Weight Bearing Restrictions: Yes LLE Weight Bearing: Non weight bearing    Mobility  Bed Mobility Overal bed mobility: Needs Assistance Bed Mobility: Sit to Supine       Sit to supine: Supervision (head of bed flat)   General bed mobility comments: verbal cues for technique. no physical assistance required for bed mobility  Transfers Overall transfer level: Needs assistance Equipment used: Rolling walker (2 wheeled) Transfers: Sit to/from Stand Sit to Stand: Min guard         General transfer comment: verbal and visual cues for LLE positioning with sitting and standing to maintain weight bearing during transfers.  Ambulation/Gait Ambulation/Gait assistance: Min guard;Supervision Gait Distance (Feet): 20 Feet (x 2 bouts) Assistive device: Rolling walker (2 wheeled) Gait Pattern/deviations: Decreased step length - right Gait velocity: decreased   General Gait Details: verbal cues for use of rolling walker and technique to maintain NWB of LLE with ambulation. patient has difficulty maintaining NWB and tends to put left foot on the floor with fatigue. did not progress ambulation distance further as patient has difficulty with weight bearing status   Stairs             Wheelchair Mobility    Modified Rankin (Stroke Patients Only)       Balance Overall balance assessment: Needs assistance Sitting-balance support: Feet supported;No upper extremity supported Sitting balance-Leahy Scale: Good     Standing balance support: Bilateral upper extremity supported Standing balance-Leahy Scale: Fair Standing balance comment: patient relying heavily on rolling walker for support to maintain NWB of LLE in standing position  Cognition Arousal/Alertness: Awake/alert Behavior During Therapy: WFL for tasks assessed/performed Overall  Cognitive Status: Within Functional Limits for tasks assessed                                        Exercises      General Comments        Pertinent Vitals/Pain Pain Assessment: 0-10 Pain Score: 5  Pain Location: LLE Pain Descriptors / Indicators: Sore Pain Intervention(s): Limited activity within patient's tolerance;Monitored during session;Premedicated before session;Ice applied (ice pack applied left ankle at end of session with LLE elevated on pillow in bed for pain and edema management)    Home Living                      Prior Function            PT Goals (current goals can now be found in the care plan section) Acute Rehab PT Goals Patient Stated Goal: to sleep and feel better PT Goal Formulation: With patient Time For Goal Achievement: 10/01/20 Potential to Achieve Goals: Good Progress towards PT goals: Progressing toward goals    Frequency    Min 2X/week      PT Plan Current plan remains appropriate    Co-evaluation              AM-PAC PT "6 Clicks" Mobility   Outcome Measure  Help needed turning from your back to your side while in a flat bed without using bedrails?: None Help needed moving from lying on your back to sitting on the side of a flat bed without using bedrails?: None Help needed moving to and from a bed to a chair (including a wheelchair)?: A Little Help needed standing up from a chair using your arms (e.g., wheelchair or bedside chair)?: A Little Help needed to walk in hospital room?: A Little Help needed climbing 3-5 steps with a railing? : A Lot 6 Click Score: 19    End of Session Equipment Utilized During Treatment: Gait belt Activity Tolerance: Patient tolerated treatment well Patient left: in bed;with call bell/phone within reach;with bed alarm set Nurse Communication: Mobility status PT Visit Diagnosis: Unsteadiness on feet (R26.81);Muscle weakness (generalized) (M62.81);Difficulty in walking,  not elsewhere classified (R26.2)     Time: 3329-5188 PT Time Calculation (min) (ACUTE ONLY): 40 min  Charges:  $Gait Training: 23-37 mins $Therapeutic Activity: 8-22 mins                     Chad Walsh, PT, MPT    Chad Walsh 09/18/2020, 10:49 AM

## 2020-09-18 NOTE — TOC Initial Note (Signed)
Transition of Care [facility]) - Initial/Assessment Note    Patient Details  Name: Chad Walsh MRN: [id number] Date of Birth: May 27, 1981  Transition of Care [facility]) CM/SW Contact:    Shelbie Ammons, RN Phone Number: 09/18/2020, 1:22 PM  Clinical Narrative:     RNCM met with patient in room. Patient sitting up to side of bed attempting to use urinal. Patient reports to feeling somewhat better today. Discussed discharge recommendations for home health and equipment. At this time patient does not feel he needs any home health, he is however agreeable to having a wheel chair if he is able to get one.  RNCM reached out to [facility] with [employer] for charity wheelchair/cushion.             Expected Discharge Plan: Home/Self Care Barriers to Discharge: Continued Medical Work up   Patient Goals and CMS Choice        Expected Discharge Plan and Services Expected Discharge Plan: Home/Self Care       Living arrangements for the past 2 months: Mobile Home                 DME Arranged: Youth worker wheelchair with seat cushion DME Agency: [employer] Date DME Agency Contacted: 09/18/20 Time DME Agency Contacted: [id number] Representative spoke with at DME Agency: Mardene Celeste            Prior Living Arrangements/Services Living arrangements for the past 2 months: Mobile Home Lives with:: Self Patient language and need for interpreter reviewed:: Yes Do you feel safe going back to the place where you live?: Yes      Need for Family Participation in Patient Care: Yes (Comment) Care giver support system in place?: Yes (comment)   Criminal Activity/Legal Involvement Pertinent to Current Situation/Hospitalization: No - Comment as needed  Activities of Daily Living Home Assistive Devices/Equipment: None ADL Screening (condition at time of admission) Patient's cognitive ability adequate to safely complete daily activities?: Yes Is the patient deaf or have difficulty hearing?: No Does the  patient have difficulty seeing, even when wearing glasses/contacts?: No Does the patient have difficulty concentrating, remembering, or making decisions?: No Patient able to express need for assistance with ADLs?: No Does the patient have difficulty dressing or bathing?: No Independently performs ADLs?: Yes (appropriate for developmental age) Does the patient have difficulty walking or climbing stairs?: No Weakness of Legs: Right Weakness of Arms/Hands: None  Permission Sought/Granted                  Emotional Assessment Appearance:: Appears stated age Attitude/Demeanor/Rapport: Engaged Affect (typically observed): Appropriate Orientation: : Oriented to Self,Oriented to Place,Oriented to  Time,Oriented to Situation Alcohol / Substance Use: Not Applicable Psych Involvement: No (comment)  Admission diagnosis:  Alcohol abuse [F10.10] Subdural hematoma ([place]) [S06.5X9A] Closed left ankle fracture [S82.892A] Injury of head, initial encounter [S09.90XA] Closed fracture dislocation of left ankle, initial encounter [J19.147W] Patient Active Problem List   Diagnosis Date Noted  . Closed left ankle fracture 09/16/2020  . Transaminitis 09/16/2020  . Hypokalemia 09/16/2020  . Subdural hematoma, post-traumatic ([place]) 09/16/2020  . Thrombocytopenia ([place]) 09/16/2020  . Alcohol abuse 08/01/2020  . Depression 08/01/2020  . Grieving 08/01/2020  . Gastric reflux 08/01/2020  . Vitamin D deficiency 08/01/2020   PCP:  Patient, No Pcp Per Pharmacy:  No Pharmacies Listed    Social Determinants of Health (SDOH) Interventions    Readmission Risk Interventions No flowsheet data found.

## 2020-09-19 LAB — CBC WITH DIFFERENTIAL/PLATELET
Abs Immature Granulocytes: 0.05 10*3/uL (ref 0.00–0.07)
Basophils Absolute: 0.1 10*3/uL (ref 0.0–0.1)
Basophils Relative: 1 %
Eosinophils Absolute: 0.2 10*3/uL (ref 0.0–0.5)
Eosinophils Relative: 2 %
HCT: 34.2 % — ABNORMAL LOW (ref 39.0–52.0)
Hemoglobin: 11.7 g/dL — ABNORMAL LOW (ref 13.0–17.0)
Immature Granulocytes: 1 %
Lymphocytes Relative: 16 %
Lymphs Abs: 1.4 10*3/uL (ref 0.7–4.0)
MCH: 35.2 pg — ABNORMAL HIGH (ref 26.0–34.0)
MCHC: 34.2 g/dL (ref 30.0–36.0)
MCV: 103 fL — ABNORMAL HIGH (ref 80.0–100.0)
Monocytes Absolute: 1.3 10*3/uL — ABNORMAL HIGH (ref 0.1–1.0)
Monocytes Relative: 15 %
Neutro Abs: 5.9 10*3/uL (ref 1.7–7.7)
Neutrophils Relative %: 65 %
Platelets: 102 10*3/uL — ABNORMAL LOW (ref 150–400)
RBC: 3.32 MIL/uL — ABNORMAL LOW (ref 4.22–5.81)
RDW: 19.3 % — ABNORMAL HIGH (ref 11.5–15.5)
WBC: 8.9 10*3/uL (ref 4.0–10.5)
nRBC: 0.2 % (ref 0.0–0.2)

## 2020-09-19 LAB — COMPREHENSIVE METABOLIC PANEL
ALT: 108 U/L — ABNORMAL HIGH (ref 0–44)
AST: 234 U/L — ABNORMAL HIGH (ref 15–41)
Albumin: 3.5 g/dL (ref 3.5–5.0)
Alkaline Phosphatase: 132 U/L — ABNORMAL HIGH (ref 38–126)
Anion gap: 12 (ref 5–15)
BUN: 5 mg/dL — ABNORMAL LOW (ref 6–20)
CO2: 27 mmol/L (ref 22–32)
Calcium: 8.7 mg/dL — ABNORMAL LOW (ref 8.9–10.3)
Chloride: 97 mmol/L — ABNORMAL LOW (ref 98–111)
Creatinine, Ser: 0.53 mg/dL — ABNORMAL LOW (ref 0.61–1.24)
GFR, Estimated: >60 mL/min (ref >60)
Glucose, Bld: 114 mg/dL — ABNORMAL HIGH (ref 70–99)
Potassium: 3.8 mmol/L (ref 3.5–5.1)
Sodium: 136 mmol/L (ref 135–145)
Total Bilirubin: 5.5 mg/dL — ABNORMAL HIGH (ref 0.3–1.2)
Total Protein: 7 g/dL (ref 6.5–8.1)

## 2020-09-19 LAB — MAGNESIUM: Magnesium: 2.1 mg/dL (ref 1.7–2.4)

## 2020-09-19 MED ORDER — DIPHENHYDRAMINE HCL 25 MG PO CAPS
25.0000 mg | ORAL_CAPSULE | Freq: Four times a day (QID) | ORAL | Status: DC | PRN
  Administered 2020-09-19 – 2020-09-20 (×3): 25 mg via ORAL
  Filled 2020-09-19 (×3): qty 1

## 2020-09-19 MED ORDER — TRAMADOL HCL 50 MG PO TABS
50.0000 mg | ORAL_TABLET | Freq: Four times a day (QID) | ORAL | Status: DC | PRN
  Administered 2020-09-20 (×2): 50 mg via ORAL
  Filled 2020-09-19 (×2): qty 1

## 2020-09-19 MED ORDER — OXYCODONE HCL 5 MG PO TABS
10.0000 mg | ORAL_TABLET | Freq: Four times a day (QID) | ORAL | Status: DC | PRN
  Administered 2020-09-19 – 2020-09-20 (×5): 10 mg via ORAL
  Filled 2020-09-19 (×5): qty 2

## 2020-09-19 NOTE — Progress Notes (Signed)
Physical Therapy Treatment Patient Details Name: Chad Walsh MRN: [id number] DOB: February 25, 1981 Today's Date: 09/19/2020    History of Present Illness Chad Walsh is a 40 y.o. male who had a fall 2 days ago.  He slipped and fell on ice, twisting his ankle.  This occurred while he was intoxicated from alcohol.  Of note, he has a significant history of alcohol abuse.  The patient states that he lives alone, but his father can come to help.  He is currently unemployed as he quit his job from [employer].  X-rays in the emergency department showed a lateral malleolus fracture with significant displacement and disruption of the ankle mortise.  He underwent a closed reduction and splint placement in the emergency department.  A CT scan was then obtained. Of note, the patient was also found to have a subdural hematoma, and repeat CT scan will be obtained.    PT Comments    Pt alert, agreeable to PT. RN in room to administer pain medication. Supine to sit with supervision, good sitting balance noted. He was able to sit <> Stand from EOB with CGA and RW, good adherence to NWB precautions. He ambulated ~55ft with RW, intermittently placed LE on ground to rest, but able to maintain precautions ~80% of the time. Pt declined further mobility to eat lunch. Pt in chair, all needs in reach. The patient would benefit from further skilled PT intervention to continue to progress towards goals. Recommendation remains appropriate. Will most likely need stair training tomorrow, pt stated he now plans on going home (several steps to enter without rails).     Follow Up Recommendations  Home health PT;Supervision for mobility/OOB     Equipment Recommendations  Rolling walker with 5" wheels;3in1 (PT);Wheelchair (measurements PT);Wheelchair cushion (measurements PT)    Recommendations for Other Services       Precautions / Restrictions Precautions Precautions: Fall Required Braces or Orthoses:  Splint/Cast Splint/Cast: LLE Restrictions Weight Bearing Restrictions: Yes LLE Weight Bearing: Non weight bearing    Mobility  Bed Mobility Overal bed mobility: Needs Assistance Bed Mobility: Sit to Supine       Sit to supine: Supervision   General bed mobility comments: use of bed rails  Transfers Overall transfer level: Needs assistance Equipment used: Rolling walker (2 wheeled) Transfers: Sit to/from Stand   Stand pivot transfers: Min guard          Ambulation/Gait Ambulation/Gait assistance: Min guard Gait Distance (Feet): 20 Feet Assistive device: Rolling walker (2 wheeled) Gait Pattern/deviations: Decreased step length - right Gait velocity: decreased   General Gait Details: Pt intermittently would placed LE on ground to rest while ambulating, 80% of the time able to maintain NWB precautions   Stairs             Wheelchair Mobility    Modified Rankin (Stroke Patients Only)       Balance Overall balance assessment: Needs assistance Sitting-balance support: Feet supported;No upper extremity supported Sitting balance-Leahy Scale: Good     Standing balance support: Bilateral upper extremity supported Standing balance-Leahy Scale: Fair Standing balance comment: patient relying heavily on rolling walker for support to maintain NWB of LLE in standing position                            Cognition Arousal/Alertness: Awake/alert Behavior During Therapy: WFL for tasks assessed/performed Overall Cognitive Status: Within Functional Limits for tasks assessed  General Comments: Requires encouragement for participation      Exercises Other Exercises Other Exercises: Pt declined further mobility or exercises to eat lunch instead    General Comments        Pertinent Vitals/Pain Pain Assessment: 0-10 Pain Score: 5  Pain Location: LLE Pain Descriptors / Indicators: Sore;Aching Pain  Intervention(s): Limited activity within patient's tolerance;Monitored during session;Repositioned;RN gave pain meds during session    Home Living                      Prior Function            PT Goals (current goals can now be found in the care plan section) Progress towards PT goals: Progressing toward goals    Frequency    Min 2X/week      PT Plan Current plan remains appropriate    Co-evaluation              AM-PAC PT "6 Clicks" Mobility   Outcome Measure  Help needed turning from your back to your side while in a flat bed without using bedrails?: None Help needed moving from lying on your back to sitting on the side of a flat bed without using bedrails?: None Help needed moving to and from a bed to a chair (including a wheelchair)?: A Little Help needed standing up from a chair using your arms (e.g., wheelchair or bedside chair)?: A Little Help needed to walk in hospital room?: A Little Help needed climbing 3-5 steps with a railing? : A Lot 6 Click Score: 19    End of Session Equipment Utilized During Treatment: Gait belt Activity Tolerance: Patient tolerated treatment well Patient left: with call bell/phone within reach;with chair alarm set;in chair Nurse Communication: Mobility status PT Visit Diagnosis: Unsteadiness on feet (R26.81);Muscle weakness (generalized) (M62.81);Difficulty in walking, not elsewhere classified (R26.2)     Time: 5681-2751 PT Time Calculation (min) (ACUTE ONLY): 19 min  Charges:  $Therapeutic Exercise: 8-22 mins                     Olga Coaster PT, DPT 4:22 PM,09/19/20

## 2020-09-19 NOTE — Progress Notes (Signed)
PROGRESS NOTE    Chad Walsh  FUX:[id number] DOB: 1980/12/26 DOA: 09/16/2020 PCP: Patient, No Pcp Per    Assessment & Plan:   Principal Problem:   Closed left ankle fracture Active Problems:   Alcohol abuse   Transaminitis   Hypokalemia   Subdural hematoma, post-traumatic (HCC)   Thrombocytopenia (HCC)  Closed left ankle fracture: still w/ a lot pain, will increase oxycodone to 10mg . S/p mechanical fall at home secondary to alcohol intoxication. Will continue w/ temporary fixation w/ external fixator w/ eventual conversion to ORIF that will not be done this inpatient stay as per ortho surg. 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 Will need to f/u outpatient in 2-4 weeks w/ neurosurgery (Dr. )  Alcohol abuse: alcohol cessation counseling. Continue on CIWA protocol   Macrocytic anemia: secondary to alcohol abuse   Thrombocytopenia:likely secondary to alcohol abuse   Hypokalemia: WNL today.   Hypomagnesemia: WNL today    Transaminitis: labile. Secondary to alcohol abuse   Obesity: BMI 32.0. Complicates overall care & prognosis    DVT prophylaxis: lovenox  Code Status: full  Family Communication:  Disposition Plan: likely d/c home w/ home health   Status is: Inpatient  Remains inpatient appropriate because:Ongoing diagnostic testing needed not appropriate for outpatient work up, IV treatments appropriate due to intensity of illness or inability to take PO and Inpatient level of care appropriate due to severity of illness   Dispo: The patient is from: Home              Anticipated d/c is to: Home              Anticipated d/c date is: 1 or 2 days              Patient currently is not medically stable to d/c.   Difficult to place patient No     Consultants:   Ortho surg  neurosurg   Procedures:    Antimicrobials:    Subjective: Pt c/o left ankle pain still    Objective: Vitals:   09/18/20 1821 09/18/20 2103 09/18/20 2322 09/19/20 0426  BP: 128/70 126/70 126/70 (!) 143/79  Pulse: (!) 107 97 97 81  Resp: 16 16 17 17   Temp: 98 F (36.7 C) 98.2 F (36.8 C) 98.9 F (37.2 C) 98.8 F (37.1 C)  TempSrc:  Oral Oral Oral  SpO2: 95% 94% 96% 94%  Weight:      Height:        Intake/Output Summary (Last 24 hours) at 09/19/2020 0729 Last data filed at 09/19/2020 0359 Gross per 24 hour  Intake 120 ml  Output 1300 ml  Net -1180 ml   Filed Weights   09/16/20 0050 09/17/20 0025  Weight: 94.3 kg 95.7 kg    Examination:  General exam: Appears uncomfortable  Respiratory system: clear breath sounds b/l  Cardiovascular system:S1/S2+. No rubs or clicks  Gastrointestinal system: Abd is soft, NT, obese, & hypoactive bowel sounds  Central nervous system: Alert and oriented. Moves all 4 extremities  Psychiatry: Judgement and insight appear normal. Flat mood and affect.     Data Reviewed: I have personally reviewed following labs and imaging studies  CBC: Recent Labs  Lab 09/16/20 0854 09/17/20 0234 09/18/20 0511 09/19/20 0451  WBC 9.9 8.5 8.1 8.9  NEUTROABS 7.4  --  5.6 5.9  HGB 10.2* 10.8* 10.2* 11.7*  HCT 29.5* 31.2* 30.2* 34.2*  MCV 98.0 97.8 102.0* 103.0*  PLT 106* 76* 70* 102*   Basic Metabolic Panel: Recent Labs  Lab 09/16/20 0854 09/17/20 0234 09/18/20 0511 09/19/20 0451  NA 139 137 136 136  K 2.7* 3.1* 3.3* 3.8  CL 97* 95* 94* 97*  CO2 28 29 26 27   GLUCOSE 110* 92 79 114*  BUN <5* <5* 6 5*  CREATININE 0.50* 0.60* 0.56* 0.53*  CALCIUM 7.8* 7.9* 7.6* 8.7*  MG 1.2* 1.1* 1.1* 2.1   GFR: Estimated Creatinine Clearance: 141.8 mL/min (A) (by C-G formula based on SCr of 0.53 mg/dL (L)). Liver Function Tests: Recent Labs  Lab 09/16/20 0854 09/17/20 0234 09/18/20 0511 09/19/20 0451  AST 256* 302* 264* 234*  ALT 99* 108* 103* 108*  ALKPHOS 123 133* 122 132*  BILITOT 4.0* 6.4* 6.8* 5.5*  PROT 6.5 6.6 6.2* 7.0   ALBUMIN 3.3* 3.4* 3.0* 3.5   No results for input(s): LIPASE, AMYLASE in the last 168 hours. No results for input(s): AMMONIA in the last 168 hours. Coagulation Profile: No results for input(s): INR, PROTIME in the last 168 hours. Cardiac Enzymes: No results for input(s): CKTOTAL, CKMB, CKMBINDEX, TROPONINI in the last 168 hours. BNP (last 3 results) No results for input(s): PROBNP in the last 8760 hours. HbA1C: No results for input(s): HGBA1C in the last 72 hours. CBG: No results for input(s): GLUCAP in the last 168 hours. Lipid Profile: No results for input(s): CHOL, HDL, LDLCALC, TRIG, CHOLHDL, LDLDIRECT in the last 72 hours. Thyroid Function Tests: No results for input(s): TSH, T4TOTAL, FREET4, T3FREE, THYROIDAB in the last 72 hours. Anemia Panel: No results for input(s): VITAMINB12, FOLATE, FERRITIN, TIBC, IRON, RETICCTPCT in the last 72 hours. Sepsis Labs: No results for input(s): PROCALCITON, LATICACIDVEN in the last 168 hours.  Recent Results (from the past 240 hour(s))  SARS Coronavirus 2 by RT PCR (hospital order, performed in [facility] hospital lab) Nasopharyngeal Nasopharyngeal Swab     Status: None   Collection Time: 09/16/20  8:54 AM   Specimen: Nasopharyngeal Swab  Result Value Ref Range Status   SARS Coronavirus 2 NEGATIVE NEGATIVE Final    Comment: (NOTE) SARS-CoV-2 target nucleic acids are NOT DETECTED.  The SARS-CoV-2 RNA is generally detectable in upper and lower respiratory specimens during the acute phase of infection. The lowest concentration of SARS-CoV-2 viral copies this assay can detect is 250 copies / mL. A negative result does not preclude SARS-CoV-2 infection and should not be used as the sole basis for treatment or other patient management decisions.  A negative result may occur with improper specimen collection / handling, submission of specimen other than nasopharyngeal swab, presence of viral mutation(s) within the areas targeted by this  assay, and inadequate number of viral copies (<250 copies / mL). A negative result must be combined with clinical observations, patient history, and epidemiological information.  Fact Sheet for Patients:   09/18/20  Fact Sheet for Healthcare Providers: BoilerBrush.com.cy  This test is not yet approved or  cleared by the https://pope.com/ FDA and has been authorized for detection and/or diagnosis of SARS-CoV-2 by FDA under an Emergency Use Authorization (EUA).  This EUA will remain in effect (meaning this test can be used) for the duration of the COVID-19 declaration under Section 564(b)(1) of the Act, 21 U.S.C. section 360bbb-3(b)(1), unless the authorization is terminated or revoked sooner.  Performed at [facility], [address] [place], [place]          Radiology Studies: No results found.  Scheduled Meds: . cloNIDine  0.05 mg Oral Daily  . enoxaparin (LOVENOX) injection  40 mg Subcutaneous Daily  . LORazepam  0-4 mg Intravenous Q12H   Or  . LORazepam  0-4 mg Oral Q12H  . pantoprazole  40 mg Oral Daily  . potassium chloride  40 mEq Oral BID  . sodium chloride flush  3 mL Intravenous Q12H  . thiamine  100 mg Oral Daily   Or  . thiamine  100 mg Intravenous Daily   Continuous Infusions: .  ceFAZolin (ANCEF) IV Stopped (09/16/20 0843)     LOS: 3 days    Time spent: 32 mins     Charise Killian, MD [facility] Pager [phone]  If 7PM-7AM, please contact night-coverage 09/19/2020, 7:29 AM

## 2020-09-20 LAB — CBC WITH DIFFERENTIAL/PLATELET
Abs Immature Granulocytes: 0.03 10*3/uL (ref 0.00–0.07)
Basophils Absolute: 0 10*3/uL (ref 0.0–0.1)
Basophils Relative: 1 %
Eosinophils Absolute: 0.2 10*3/uL (ref 0.0–0.5)
Eosinophils Relative: 2 %
HCT: 31 % — ABNORMAL LOW (ref 39.0–52.0)
Hemoglobin: 10.4 g/dL — ABNORMAL LOW (ref 13.0–17.0)
Immature Granulocytes: 1 %
Lymphocytes Relative: 17 %
Lymphs Abs: 1.1 10*3/uL (ref 0.7–4.0)
MCH: 34.9 pg — ABNORMAL HIGH (ref 26.0–34.0)
MCHC: 33.5 g/dL (ref 30.0–36.0)
MCV: 104 fL — ABNORMAL HIGH (ref 80.0–100.0)
Monocytes Absolute: 1.4 10*3/uL — ABNORMAL HIGH (ref 0.1–1.0)
Monocytes Relative: 22 %
Neutro Abs: 3.8 10*3/uL (ref 1.7–7.7)
Neutrophils Relative %: 57 %
Platelets: 105 10*3/uL — ABNORMAL LOW (ref 150–400)
RBC: 2.98 MIL/uL — ABNORMAL LOW (ref 4.22–5.81)
RDW: 19.6 % — ABNORMAL HIGH (ref 11.5–15.5)
WBC: 6.6 10*3/uL (ref 4.0–10.5)
nRBC: 0 % (ref 0.0–0.2)

## 2020-09-20 LAB — COMPREHENSIVE METABOLIC PANEL
ALT: 89 U/L — ABNORMAL HIGH (ref 0–44)
AST: 169 U/L — ABNORMAL HIGH (ref 15–41)
Albumin: 3.2 g/dL — ABNORMAL LOW (ref 3.5–5.0)
Alkaline Phosphatase: 109 U/L (ref 38–126)
Anion gap: 9 (ref 5–15)
BUN: 5 mg/dL — ABNORMAL LOW (ref 6–20)
CO2: 27 mmol/L (ref 22–32)
Calcium: 9 mg/dL (ref 8.9–10.3)
Chloride: 99 mmol/L (ref 98–111)
Creatinine, Ser: 0.53 mg/dL — ABNORMAL LOW (ref 0.61–1.24)
GFR, Estimated: >60 mL/min (ref >60)
Glucose, Bld: 97 mg/dL (ref 70–99)
Potassium: 4.1 mmol/L (ref 3.5–5.1)
Sodium: 135 mmol/L (ref 135–145)
Total Bilirubin: 4.4 mg/dL — ABNORMAL HIGH (ref 0.3–1.2)
Total Protein: 6.4 g/dL — ABNORMAL LOW (ref 6.5–8.1)

## 2020-09-20 LAB — MAGNESIUM: Magnesium: 1.9 mg/dL (ref 1.7–2.4)

## 2020-09-20 MED ORDER — OXYCODONE HCL 10 MG PO TABS: 10.0000 mg | ORAL_TABLET | Freq: Four times a day (QID) | ORAL | 0 refills | Status: AC | PRN

## 2020-09-20 MED ORDER — ASPIRIN EC 81 MG PO TBEC: 81.0000 mg | DELAYED_RELEASE_TABLET | Freq: Every day | ORAL | 0 refills | Status: DC

## 2020-09-20 NOTE — Progress Notes (Signed)
Physical Therapy Treatment Patient Details Name: Chad Walsh MRN: [id number] DOB: [birth year] Today's Date: 09/20/2020    History of Present Illness Chad Walsh is a 40 y.o. male who had a fall 2 days ago.  He slipped and fell on ice, twisting his ankle.  This occurred while he was intoxicated from alcohol.  Of note, he has a significant history of alcohol abuse.  The patient states that he lives alone, but his father can come to help.  He is currently unemployed as he quit his job from [employer].  X-rays in the emergency department showed a lateral malleolus fracture with significant displacement and disruption of the ankle mortise.  He underwent a closed reduction and splint placement in the emergency department.  A CT scan was then obtained. Of note, the patient was also found to have a subdural hematoma, and repeat CT scan will be obtained.    PT Comments    Patient up, alert, in chair at start of session, reported 7/10 pain in LLE. The patient demonstrated several sit <> stand transfers with RW and CGA/supervision, cueing needed to maintain NWB. Session focused on stair training in preparation for safe discharge home; 2 bouts of 4 steps with minAx2 and RW. Overall the patient exhibited difficulty with maintaining NWB precautions with stairs, encouraged throughout to address and education provided on importance of precautions. Second bout of stair navigation pt did exhibit better adherence to NWB status. Pt verbalized understanding of safety of stairs and given written handout to reinforce. Pt returned to room, in chair, with all needs in reach.  The patient would benefit from further skilled PT intervention to continue to progress towards goals. Recommendation remains appropriate.       Follow Up Recommendations  Home health PT;Supervision for mobility/OOB     Equipment Recommendations  Rolling walker with 5" wheels;3in1 (PT);Wheelchair (measurements PT);Wheelchair cushion  (measurements PT)    Recommendations for Other Services       Precautions / Restrictions Precautions Precautions: Fall Required Braces or Orthoses: Splint/Cast Splint/Cast: LLE Restrictions Weight Bearing Restrictions: Yes LLE Weight Bearing: Non weight bearing    Mobility  Bed Mobility                  Transfers Overall transfer level: Needs assistance Equipment used: Rolling walker (2 wheeled) Transfers: Sit to/from Stand Sit to Stand: Min guard;Supervision         General transfer comment: verbal cues for NWB precautions  Ambulation/Gait Ambulation/Gait assistance: Min guard Gait Distance (Feet): 2 Feet Assistive device: Rolling walker (2 wheeled)       General Gait Details: hop to gait pattern encouraged, with fatigue pt with TTWB on LLE   Stairs Stairs: Yes Stairs assistance: Min assist;+2 safety/equipment Stair Management: Backwards;Forwards;With walker Number of Stairs: 4 General stair comments: 4 steps, twice. Second attempt pt with improved hop height and stability as well as improved adherence to NWB precautions (~70% for stairs on second attempt)   Wheelchair Mobility    Modified Rankin (Stroke Patients Only)       Balance Overall balance assessment: Needs assistance Sitting-balance support: Feet supported Sitting balance-Leahy Scale: Good     Standing balance support: Bilateral upper extremity supported Standing balance-Leahy Scale: Fair                              Cognition Arousal/Alertness: Awake/alert Behavior During Therapy: WFL for tasks assessed/performed Overall Cognitive Status: Within Functional Limits  for tasks assessed                                        Exercises Other Exercises Other Exercises: Pt declined further mobility at this time due to fatigue and wanting to eat breakfast    General Comments        Pertinent Vitals/Pain Pain Assessment: 0-10 Pain Score: 7  Pain  Location: LLE Pain Descriptors / Indicators: Sore;Aching Pain Intervention(s): Limited activity within patient's tolerance;Monitored during session;Repositioned;Premedicated before session;Patient requesting pain meds-RN notified    Home Living                      Prior Function            PT Goals (current goals can now be found in the care plan section) Progress towards PT goals: Progressing toward goals    Frequency    Min 2X/week      PT Plan Current plan remains appropriate    Co-evaluation              AM-PAC PT "6 Clicks" Mobility   Outcome Measure  Help needed turning from your back to your side while in a flat bed without using bedrails?: None Help needed moving from lying on your back to sitting on the side of a flat bed without using bedrails?: None Help needed moving to and from a bed to a chair (including a wheelchair)?: None Help needed standing up from a chair using your arms (e.g., wheelchair or bedside chair)?: None Help needed to walk in hospital room?: A Little Help needed climbing 3-5 steps with a railing? : A Lot 6 Click Score: 21    End of Session Equipment Utilized During Treatment: Gait belt Activity Tolerance: Patient tolerated treatment well Patient left: with call bell/phone within reach;with chair alarm set;in chair Nurse Communication: Mobility status;Patient requests pain meds PT Visit Diagnosis: Unsteadiness on feet (R26.81);Muscle weakness (generalized) (M62.81);Difficulty in walking, not elsewhere classified (R26.2)     Time: 7425-9563 PT Time Calculation (min) (ACUTE ONLY): 30 min  Charges:  $Gait Training: 23-37 mins                     Olga Coaster PT, DPT 10:19 AM,09/20/20

## 2020-09-20 NOTE — Discharge Summary (Signed)
Physician Discharge Summary  Chad ClarkMarcus J Walsh ZOX:[id number] DOB: 18-Jul-1981 DOA: 09/16/2020  PCP: Patient, No Pcp Per  Admit date: 09/16/2020 Discharge date: 09/20/2020  Admitted From: home  Disposition:  home  Recommendations for Outpatient Follow-up:  1. Follow up with PCP in 1-2 weeks 2. F/u w/ ortho surg, Dr. Signa KellSunny Patel, in 3-4 days  3. F/u w neurosurg, Dr. Adriana Simasook, in 2-4 weeks  Home Health: no, pt declined  Equipment/Devices: wheelchair as pt is non-weightbearing of LLE  Discharge Condition: stable  CODE STATUS: full  Diet recommendation: Regular  Brief/Interim Summary: HPI was taken from Dr. Joylene IgoAgbata: Chad Walsh is a 40 y.o. male with medical history significant for alcohol abuse who presents to the ER for evaluation of pain and swelling involving his left ankle.  Patient stated that he was drinking about 2 days ago when he slipped and fell on ice twisting his left ankle.  He admits to hitting his head but does not think he lost consciousness.  He has had pain and swelling in that left ankle which has progressively worsened prompting his visit to the ER.  He rates his pain a 7 x 10 intensity at its worst and pain is worse when he bears weight on his left leg.  He has no relieving factors.  Patient noted to have multiple superficial lacerations to his face and upper extremities which he reports are from the fall.  He is unsure of his last tetanus vaccination. He denies having any headache, no blurry vision, no chest pain, no diaphoresis, no palpitations, no dizziness, no lightheadedness, no orthopnea, no shortness of breath, no nausea, no vomiting, no abdominal pain, no changes in his bowel habits, no urinary symptoms, no fever, no chills, no cough. Labs show white count 9.9, hemoglobin 10.2, hematocrit 29.5, MCV 98, RDW 17.8, platelet count 206 SARS coronavirus 2 PCR test is pending CT scan of left ankle shows bimalleolar fracture-dislocation of the left ankle as described  above.  Interval reduction of tibiotalar joint dislocation with improved alignment. Persistent lateral translation of the talar dome relative to the tibia with widening of the medial clear space. Several tiny ossific densities inferior to the tip of the medial malleolus likely representing tiny cortical avulsions related to anterior and posterior talofibular ligament injuries. Scattered foci of increased density within the subcutaneous soft tissues overlying the medial aspect of the distal tibial metaphysis compatible with hematoma. Somewhat ill-defined collection measures approximately 4.0 x 1.0 x 3.5 cm. CT scan of head without contrast and cervical spine shows small bilateral subdural hematomas measuring up to 5 mm in maximal thickness. The collections are mixed density but include high-density acute appearing components. Negative for facial or cervical spine fracture. X-ray of the left ankle shows interval casting with improved alignment of the tibiotalar joint and of the distal fibular fracture. Despite the interval reduction however there is still mild lateral dislocation of the talus relative to the distal tibia as well as offset of the distal fibular fracture. Twelve-lead EKG reviewed by me shows sinus tachycardia.    ED Course: Patient is a 40 year old male with a history of alcohol abuse who presents to the emergency room for evaluation of left ankle pain and swelling for 2 days.  Patient is status post fall and has a left ankle fracture.  CT scan of the head shows subdural hematoma.  Neurosurgery was consulted in the ER, recommendations in chart.  Patient will be admitted to the hospital for surgical repair of his ankle fracture.  Hospital Course from Dr. Mayford Knife 1/25-1/27/22: Pt was found to have a closed left ankle fracture secondary to mechanical fall home secondary to alcohol intoxication. S/p temporary fixation w/ external fixator w/ eventual conversion to ORIF as per ortho surg.  Pt will f/u outpatient w/ ortho surg in 3-4 days. Pt verbalized his understanding. Of note pt is to continue nonweightbearing of LLE & pt verbalized his understanding. Also, pt was found to have acute on chronic subdural hematoma for which neurosurg recommended f/u outpatient in 2-4 weeks. For more information, please see previous progress/consult notes.    Discharge Diagnoses:  Principal Problem:   Closed left ankle fracture Active Problems:   Alcohol abuse   Transaminitis   Hypokalemia   Subdural hematoma, post-traumatic (HCC)   Thrombocytopenia (HCC)   Closed left ankle fracture: will continue oxycodone to 10mg . S/p mechanical fall at home secondary to alcohol intoxication. Will continue w/ temporary fixation w/ external fixator w/ eventual conversion to ORIF that will not be done this inpatient stay as per ortho surg. 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 Will need to f/u outpatient in 2-4 weeks w/ neurosurgery (Dr. )  Alcohol abuse: alcohol cessation counseling. Continue on CIWA protocol   Macrocytic anemia: secondary to alcohol abuse   Thrombocytopenia:likely secondary to alcohol abuse   Hypokalemia: WNL today.   Hypomagnesemia: WNL today    Transaminitis: labile. Secondary to alcohol abuse   Obesity: BMI 32.0. Complicates overall care & prognosis   Discharge Instructions  Discharge Instructions    Diet general   Complete by: As directed    Discharge instructions   Complete by: As directed    F/u w/ ortho surg, Dr. Adriana Simas, in 3-4 days. F/u w/ PCP in 1-2 weeks. Continue to be non-weightbearing of left leg/foot until otherwise told by ortho surg   Increase activity slowly   Complete by: As directed      Allergies as of 09/20/2020   No Known Allergies     Medication List    TAKE these medications   aspirin EC 81 MG tablet Take 1 tablet (81 mg total) by mouth daily. Swallow  whole.   Oxycodone HCl 10 MG Tabs Take 1 tablet (10 mg total) by mouth every 6 (six) hours as needed for up to 5 days for moderate pain or severe pain.            Durable Medical Equipment  (From admission, onward)         Start     Ordered   09/18/20 1351  For home use only DME standard manual wheelchair with seat cushion  Once       Comments: Patient suffers from left ankle fracture which impairs their ability to perform daily activities like walking in the home.  A walking, cooking,dressing,bathing will not resolve issue with performing activities of daily living. A wheelchair will allow patient to safely perform daily activities. Patient can safely propel the wheelchair in the home or has a caregiver who can provide assistance. Length of need 2-3 months  Accessories: elevating leg rests (ELRs), wheel locks, extensions and anti-tippers.   09/18/20 1352   09/18/20 1259  For home use only DME 3 n 1  Once        09/18/20 1259   09/18/20 1259  For home use only DME wheelchair cushion (seat and back)  Once        09/18/20 1259  Follow-up Information    Dedra Skeens, [place]. Go on 09/24/2020.   Specialty: Orthopedic Surgery Why: @ 2:00 pm Contact information: [address] [place] [address] [phone]              No Known Allergies  Consultations:  Ortho surg: Dr. Eliane Decree  Neurosurgery: Dr. Adriana Simas    Procedures/Studies: DG Ankle Complete Left  Result Date: 09/16/2020 CLINICAL DATA:  Patient status post reduction of the left ankle. EXAM: LEFT ANKLE COMPLETE - 3+ VIEW COMPARISON:  Left ankle radiographs earlier same day. FINDINGS: Overlying casting material. Improved alignment of the oblique fracture of the distal fibula. There is improved alignment of the tibiotalar joint with mild lateral dislocation of the talus relative to the distal tibia. IMPRESSION: Interval casting with improved alignment of the tibiotalar joint and of  the distal fibular fracture. Despite the interval reduction however there is still mild lateral dislocation of the talus relative to the distal tibia as well as offset of the distal fibular fracture. Electronically Signed   By: Annia Belt M.D.   On: 09/16/2020 07:59   DG Ankle Complete Left  Result Date: 09/16/2020 CLINICAL DATA:  Left ankle pain after slip and fall injury 2 days ago. EXAM: LEFT ANKLE COMPLETE - 3+ VIEW COMPARISON:  None. FINDINGS: Fracture dislocation of the left ankle. There is an oblique fracture of the distal fibula with 4 mm lateral displacement and 13 mm overriding of the distal fracture fragment. Lateral dislocation of the talus with respect to the tibia of about 1.8 cm. Tiny bone fragment medial to the talus likely represents a ligamentous avulsion fragment. Diffuse soft tissue swelling. IMPRESSION: Fracture dislocation of the left ankle with lateral dislocation of the talus with respect to the tibia. Electronically Signed   By: Burman Nieves M.D.   On: 09/16/2020 01:32   CT Head Wo Contrast  Result Date: 09/16/2020 CLINICAL DATA:  Head trauma, mod-severe repeat 6 hours for 3pm EXAM: CT HEAD WITHOUT CONTRAST TECHNIQUE: Contiguous axial images were obtained from the base of the skull through the vertex without intravenous contrast. COMPARISON:  09/16/2020. FINDINGS: Brain: Trace mixed density bilateral subdural collections also tracking along the tentorium are grossly unchanged in size measuring up to 5 mm. No new site of intracranial hemorrhage. No acute infarct. No midline shift or ventriculomegaly. No mass lesion. Vascular: No hyperdense vessel or unexpected calcification. Skull: No acute finding. Sinuses/Orbits: No acute orbital finding. Pneumatized paranasal sinuses. Other: None. IMPRESSION: Small bilateral mixed density subdural hematomas measuring up to 5 mm are unchanged. No new intracranial hemorrhage or acute infarct. Electronically Signed   By: Stana Bunting M.D.    On: 09/16/2020 15:35   CT Head Wo Contrast  Result Date: 09/16/2020 CLINICAL DATA:  Facial trauma related to fall 2 days ago EXAM: CT HEAD WITHOUT CONTRAST CT MAXILLOFACIAL WITHOUT CONTRAST CT CERVICAL SPINE WITHOUT CONTRAST TECHNIQUE: Multidetector CT imaging of the head, cervical spine, and maxillofacial structures were performed using the standard protocol without intravenous contrast. Multiplanar CT image reconstructions of the cervical spine and maxillofacial structures were also generated. COMPARISON:  None. FINDINGS: CT HEAD FINDINGS Brain: Trace high-density thickening of the right tentorium, 2 mm in maximum. Mixed density subdural collections along the bilateral cerebral convexity, on the left measuring up to 5 mm in thickness at a level where there is both low and high density. On the right subdural collection is mainly low-density with a few wispy high-density areas anteriorly and in the  midline. No parenchymal hemorrhage, hydrocephalus, or infarction. Vascular: No hyperdense vessel or unexpected calcification. Skull: Negative for fracture CT MAXILLOFACIAL FINDINGS Osseous: No fracture or mandibular dislocation. Orbits: No evidence of injury Sinuses: Negative for hemosinus Soft tissues: No acute finding.  Fatty atrophy of the left parotid. CT CERVICAL SPINE FINDINGS Alignment: Normal Skull base and vertebrae: No acute fracture Soft tissues and spinal canal: No prevertebral fluid or swelling. No visible canal hematoma. Disc levels:  No significant degenerative changes Upper chest: No visible injury Critical Value/emergent results were called by telephone at the time of interpretation on 09/16/2020 at 8:36 am to provider Dr Alfred Levins, who verbally acknowledged these results. IMPRESSION: 1. Small bilateral subdural hematomas measuring up to 5 mm in maximal thickness. The collections are mixed density but include high-density acute appearing components. 2. Negative for facial or cervical spine fracture.  Electronically Signed   By: Marnee Spring M.D.   On: 09/16/2020 08:41   CT Cervical Spine Wo Contrast  Result Date: 09/16/2020 CLINICAL DATA:  Facial trauma related to fall 2 days ago EXAM: CT HEAD WITHOUT CONTRAST CT MAXILLOFACIAL WITHOUT CONTRAST CT CERVICAL SPINE WITHOUT CONTRAST TECHNIQUE: Multidetector CT imaging of the head, cervical spine, and maxillofacial structures were performed using the standard protocol without intravenous contrast. Multiplanar CT image reconstructions of the cervical spine and maxillofacial structures were also generated. COMPARISON:  None. FINDINGS: CT HEAD FINDINGS Brain: Trace high-density thickening of the right tentorium, 2 mm in maximum. Mixed density subdural collections along the bilateral cerebral convexity, on the left measuring up to 5 mm in thickness at a level where there is both low and high density. On the right subdural collection is mainly low-density with a few wispy high-density areas anteriorly and in the midline. No parenchymal hemorrhage, hydrocephalus, or infarction. Vascular: No hyperdense vessel or unexpected calcification. Skull: Negative for fracture CT MAXILLOFACIAL FINDINGS Osseous: No fracture or mandibular dislocation. Orbits: No evidence of injury Sinuses: Negative for hemosinus Soft tissues: No acute finding.  Fatty atrophy of the left parotid. CT CERVICAL SPINE FINDINGS Alignment: Normal Skull base and vertebrae: No acute fracture Soft tissues and spinal canal: No prevertebral fluid or swelling. No visible canal hematoma. Disc levels:  No significant degenerative changes Upper chest: No visible injury Critical Value/emergent results were called by telephone at the time of interpretation on 09/16/2020 at 8:36 am to provider Dr Alfred Levins, who verbally acknowledged these results. IMPRESSION: 1. Small bilateral subdural hematomas measuring up to 5 mm in maximal thickness. The collections are mixed density but include high-density acute appearing  components. 2. Negative for facial or cervical spine fracture. Electronically Signed   By: Marnee Spring M.D.   On: 09/16/2020 08:41   CT Ankle Left Wo Contrast  Result Date: 09/16/2020 CLINICAL DATA:  Evaluate left ankle fracture EXAM: CT OF THE LEFT ANKLE WITHOUT CONTRAST TECHNIQUE: Multidetector CT imaging of the left ankle was performed according to the standard protocol. Multiplanar CT image reconstructions were also generated. COMPARISON:  09/16/2020 FINDINGS: Bones/Joint/Cartilage Spiral fracture of the distal fibular metaphysis with 3 mm of lateral displacement. 5.0 cm butterfly fracture fragment along the medial fracture margin with slight medial displacement. Widening of the distal tibiofibular joint compatible with syndesmotic injury. Lateral malleolus remains aligned with the lateral talus. Comminuted minimally displaced posterior malleolar fracture (series 7, images 50-63) with multiple small intra-articular fracture fragments at the posterior aspect of the tibiotalar joint. Previously seen tibiotalar joint dislocation has been reduced with improved alignment. Persistent lateral translation of the talar dome  relative to the tibia with widening of the medial clear space. There are a few tiny ossific densities inferior to the tip of the medial malleolus likely representing tiny cortical avulsions related to anterior and posterior talofibular ligament injuries. Subtalar joints aligned. Osseous alignment of the midfoot remains anatomic. TMT joints intact. No fractures evident within the midfoot or visualized forefoot. Ligaments Suboptimally assessed by CT. Muscles and Tendons No acute musculotendinous injury evident by CT. Soft tissues Circumferential soft tissue swelling about the ankle. There are scattered foci of increased density within the subcutaneous soft tissues overlying the medial aspect of the distal tibial metaphysis compatible with hematoma. Somewhat ill-defined collection measures  approximately 4.0 x 1.0 x 3.5 cm (series 9, image 171; series 8, image 132). IMPRESSION: 1. Bimalleolar fracture-dislocation of the left ankle as described above. 2. Interval reduction of tibiotalar joint dislocation with improved alignment. Persistent lateral translation of the talar dome relative to the tibia with widening of the medial clear space. 3. Several tiny ossific densities inferior to the tip of the medial malleolus likely representing tiny cortical avulsions related to anterior and posterior talofibular ligament injuries. 4. Scattered foci of increased density within the subcutaneous soft tissues overlying the medial aspect of the distal tibial metaphysis compatible with hematoma. Somewhat ill-defined collection measures approximately 4.0 x 1.0 x 3.5 cm. Electronically Signed   By: Duanne Guess D.O.   On: 09/16/2020 09:17   CT Maxillofacial Wo Contrast  Result Date: 09/16/2020 CLINICAL DATA:  Facial trauma related to fall 2 days ago EXAM: CT HEAD WITHOUT CONTRAST CT MAXILLOFACIAL WITHOUT CONTRAST CT CERVICAL SPINE WITHOUT CONTRAST TECHNIQUE: Multidetector CT imaging of the head, cervical spine, and maxillofacial structures were performed using the standard protocol without intravenous contrast. Multiplanar CT image reconstructions of the cervical spine and maxillofacial structures were also generated. COMPARISON:  None. FINDINGS: CT HEAD FINDINGS Brain: Trace high-density thickening of the right tentorium, 2 mm in maximum. Mixed density subdural collections along the bilateral cerebral convexity, on the left measuring up to 5 mm in thickness at a level where there is both low and high density. On the right subdural collection is mainly low-density with a few wispy high-density areas anteriorly and in the midline. No parenchymal hemorrhage, hydrocephalus, or infarction. Vascular: No hyperdense vessel or unexpected calcification. Skull: Negative for fracture CT MAXILLOFACIAL FINDINGS Osseous: No  fracture or mandibular dislocation. Orbits: No evidence of injury Sinuses: Negative for hemosinus Soft tissues: No acute finding.  Fatty atrophy of the left parotid. CT CERVICAL SPINE FINDINGS Alignment: Normal Skull base and vertebrae: No acute fracture Soft tissues and spinal canal: No prevertebral fluid or swelling. No visible canal hematoma. Disc levels:  No significant degenerative changes Upper chest: No visible injury Critical Value/emergent results were called by telephone at the time of interpretation on 09/16/2020 at 8:36 am to provider Dr Alfred Levins, who verbally acknowledged these results. IMPRESSION: 1. Small bilateral subdural hematomas measuring up to 5 mm in maximal thickness. The collections are mixed density but include high-density acute appearing components. 2. Negative for facial or cervical spine fracture. Electronically Signed   By: Marnee Spring M.D.   On: 09/16/2020 08:41    (Echo, Carotid, EGD, Colonoscopy, ERCP)    Subjective:   Discharge Exam: Vitals:   09/20/20 0350 09/20/20 0746  BP: 124/79 (!) 136/95  Pulse: 95 98  Resp: 20 16  Temp: 98.2 F (36.8 C) 99 F (37.2 C)  SpO2: 97% 97%   Vitals:   09/19/20 1927 09/19/20 2009 09/20/20  0350 09/20/20 0746  BP: 126/77 133/71 124/79 (!) 136/95  Pulse: (!) 109 100 95 98  Resp: 20 18 20 16   Temp: 98 F (36.7 C) 98.4 F (36.9 C) 98.2 F (36.8 C) 99 F (37.2 C)  TempSrc:  Oral    SpO2: 97% 96% 97% 97%  Weight:      Height:        General: Pt is alert, awake, not in acute distress Cardiovascular: S1/S2 +, no rubs, no gallops Respiratory: CTA bilaterally, no wheezing, no rhonchi Abdominal: Soft, NT, obese, bowel sounds + Extremities:  no cyanosis    The results of significant diagnostics from this hospitalization (including imaging, microbiology, ancillary and laboratory) are listed below for reference.     Microbiology: Recent Results (from the past 240 hour(s))  SARS Coronavirus 2 by RT PCR (hospital order,  performed in [facility] hospital lab) Nasopharyngeal Nasopharyngeal Swab     Status: None   Collection Time: 09/16/20  8:54 AM   Specimen: Nasopharyngeal Swab  Result Value Ref Range Status   SARS Coronavirus 2 NEGATIVE NEGATIVE Final    Comment: (NOTE) SARS-CoV-2 target nucleic acids are NOT DETECTED.  The SARS-CoV-2 RNA is generally detectable in upper and lower respiratory specimens during the acute phase of infection. The lowest concentration of SARS-CoV-2 viral copies this assay can detect is 250 copies / mL. A negative result does not preclude SARS-CoV-2 infection and should not be used as the sole basis for treatment or other patient management decisions.  A negative result may occur with improper specimen collection / handling, submission of specimen other than nasopharyngeal swab, presence of viral mutation(s) within the areas targeted by this assay, and inadequate number of viral copies (<250 copies / mL). A negative result must be combined with clinical observations, patient history, and epidemiological information.  Fact Sheet for Patients:   BoilerBrush.com.cy  Fact Sheet for Healthcare Providers: https://pope.com/  This test is not yet approved or  cleared by the [place] FDA and has been authorized for detection and/or diagnosis of SARS-CoV-2 by FDA under an Emergency Use Authorization (EUA).  This EUA will remain in effect (meaning this test can be used) for the duration of the COVID-19 declaration under Section 564(b)(1) of the Act, 21 U.S.C. section 360bbb-3(b)(1), unless the authorization is terminated or revoked sooner.  Performed at [facility], [address] [place], [place] [address]      Labs: BNP (last 3 results) No results for input(s): BNP in the last 8760 hours. Basic Metabolic Panel: Recent Labs  Lab 09/16/20 0854 09/17/20 0234 09/18/20 0511 09/19/20 0451 09/20/20 0558  NA  139 137 136 136 135  K 2.7* 3.1* 3.3* 3.8 4.1  CL 97* 95* 94* 97* 99  CO2 28 29 26 27 27   GLUCOSE 110* 92 79 114* 97  BUN <5* <5* 6 5* 5*  CREATININE 0.50* 0.60* 0.56* 0.53* 0.53*  CALCIUM 7.8* 7.9* 7.6* 8.7* 9.0  MG 1.2* 1.1* 1.1* 2.1 1.9   Liver Function Tests: Recent Labs  Lab 09/16/20 0854 09/17/20 0234 09/18/20 0511 09/19/20 0451 09/20/20 0558  AST 256* 302* 264* 234* 169*  ALT 99* 108* 103* 108* 89*  ALKPHOS 123 133* 122 132* 109  BILITOT 4.0* 6.4* 6.8* 5.5* 4.4*  PROT 6.5 6.6 6.2* 7.0 6.4*  ALBUMIN 3.3* 3.4* 3.0* 3.5 3.2*   No results for input(s): LIPASE, AMYLASE in the last 168 hours. No results for input(s): AMMONIA in the last 168 hours. CBC: Recent Labs  Lab 09/16/20 0854 09/17/20 0234 09/18/20 0511 09/19/20 0451 09/20/20 0558  WBC 9.9 8.5 8.1 8.9 6.6  NEUTROABS 7.4  --  5.6 5.9 3.8  HGB 10.2* 10.8* 10.2* 11.7* 10.4*  HCT 29.5* 31.2* 30.2* 34.2* 31.0*  MCV 98.0 97.8 102.0* 103.0* 104.0*  PLT 106* 76* 70* 102* 105*   Cardiac Enzymes: No results for input(s): CKTOTAL, CKMB, CKMBINDEX, TROPONINI in the last 168 hours. BNP: Invalid input(s): POCBNP CBG: No results for input(s): GLUCAP in the last 168 hours. D-Dimer No results for input(s): DDIMER in the last 72 hours. Hgb A1c No results for input(s): HGBA1C in the last 72 hours. Lipid Profile No results for input(s): CHOL, HDL, LDLCALC, TRIG, CHOLHDL, LDLDIRECT in the last 72 hours. Thyroid function studies No results for input(s): TSH, T4TOTAL, T3FREE, THYROIDAB in the last 72 hours.  Invalid input(s): FREET3 Anemia work up No results for input(s): VITAMINB12, FOLATE, FERRITIN, TIBC, IRON, RETICCTPCT in the last 72 hours. Urinalysis    Component Value Date/Time   COLORURINE YELLOW (A) 08/03/2018 0855   APPEARANCEUR CLOUDY (A) 08/03/2018 0855   LABSPEC 1.015 08/03/2018 0855   PHURINE 7.0 08/03/2018 0855   GLUCOSEU NEGATIVE 08/03/2018 0855   HGBUR NEGATIVE 08/03/2018 0855   BILIRUBINUR  NEGATIVE 08/03/2018 0855   KETONESUR 5 (A) 08/03/2018 0855   PROTEINUR 30 (A) 08/03/2018 0855   NITRITE NEGATIVE 08/03/2018 0855   LEUKOCYTESUR NEGATIVE 08/03/2018 0855   Sepsis Labs Invalid input(s): PROCALCITONIN,  WBC,  LACTICIDVEN Microbiology Recent Results (from the past 240 hour(s))  SARS Coronavirus 2 by RT PCR (hospital order, performed in [facility] hospital lab) Nasopharyngeal Nasopharyngeal Swab     Status: None   Collection Time: 09/16/20  8:54 AM   Specimen: Nasopharyngeal Swab  Result Value Ref Range Status   SARS Coronavirus 2 NEGATIVE NEGATIVE Final    Comment: (NOTE) SARS-CoV-2 target nucleic acids are NOT DETECTED.  The SARS-CoV-2 RNA is generally detectable in upper and lower respiratory specimens during the acute phase of infection. The lowest concentration of SARS-CoV-2 viral copies this assay can detect is 250 copies / mL. A negative result does not preclude SARS-CoV-2 infection and should not be used as the sole basis for treatment or other patient management decisions.  A negative result may occur with improper specimen collection / handling, submission of specimen other than nasopharyngeal swab, presence of viral mutation(s) within the areas targeted by this assay, and inadequate number of viral copies (<250 copies / mL). A negative result must be combined with clinical observations, patient history, and epidemiological information.  Fact Sheet for Patients:   BoilerBrush.com.cy  Fact Sheet for Healthcare Providers: https://pope.com/  This test is not yet approved or  cleared by the [place] FDA and has been authorized for detection and/or diagnosis of SARS-CoV-2 by FDA under an Emergency Use Authorization (EUA).  This EUA will remain in effect (meaning this test can be used) for the duration of the COVID-19 declaration under Section 564(b)(1) of the Act, 21 U.S.C. section 360bbb-3(b)(1), unless  the authorization is terminated or revoked sooner.  Performed at [facility], [address], [place], [place] [address]      Time coordinating discharge: Over 30 minutes  SIGNED:   Charise Killian, MD  [facility] 09/20/2020, 10:24 AM Pager   If 7PM-7AM, please contact night-coverage

## 2020-09-25 ENCOUNTER — Other Ambulatory Visit
Admission: RE | Admit: 2020-09-25 | Discharge: 2020-09-25 | Disposition: A | Discharge status: Home / Self Care | Payer: Self-pay | Source: Ambulatory Visit | Attending: Orthopedic Surgery | Admitting: Orthopedic Surgery

## 2020-09-25 ENCOUNTER — Encounter: Payer: Self-pay | Admitting: Orthopedic Surgery

## 2020-09-25 ENCOUNTER — Other Ambulatory Visit: Payer: Self-pay | Admitting: Orthopedic Surgery

## 2020-09-25 ENCOUNTER — Other Ambulatory Visit: Payer: Self-pay

## 2020-09-25 DIAGNOSIS — Z20822 Contact with and (suspected) exposure to covid-19: Secondary | ICD-10-CM | POA: Insufficient documentation

## 2020-09-25 DIAGNOSIS — Z01812 Encounter for preprocedural laboratory examination: Secondary | ICD-10-CM | POA: Insufficient documentation

## 2020-09-25 LAB — SARS CORONAVIRUS 2 (TAT 6-24 HRS): SARS Coronavirus 2: NEGATIVE

## 2020-09-26 ENCOUNTER — Encounter
Admission: RE | Admit: 2020-09-26 | Discharge: 2020-09-26 | Disposition: A | Discharge status: Home / Self Care | Payer: Self-pay | Source: Ambulatory Visit | Attending: Orthopedic Surgery | Admitting: Orthopedic Surgery

## 2020-09-26 HISTORY — DX: Anxiety disorder, unspecified: F41.9

## 2020-09-26 HISTORY — DX: Depression, unspecified: F32.A

## 2020-09-26 MED ORDER — LACTATED RINGERS IV SOLN: INTRAVENOUS | Status: DC

## 2020-09-26 MED ORDER — CEFAZOLIN SODIUM-DEXTROSE 2-4 GM/100ML-% IV SOLN
2.0000 g | INTRAVENOUS | Status: AC
  Administered 2020-09-27: 2 g via INTRAVENOUS

## 2020-09-26 MED ORDER — CHLORHEXIDINE GLUCONATE 0.12 % MT SOLN: 15.0000 mL | Freq: Once | OROMUCOSAL | Status: AC

## 2020-09-26 MED ORDER — ORAL CARE MOUTH RINSE: 15.0000 mL | Freq: Once | OROMUCOSAL | Status: AC

## 2020-09-26 NOTE — Patient Instructions (Addendum)
Your procedure is scheduled on:  Thursday, February 3 Report to the Registration Desk on the 1st floor of the [facility] To find out your arrival time, please call [phone] between 1PM - 3PM on: Wednesday, February 2  REMEMBER: Instructions that are not followed completely may result in serious medical risk, up to and including death; or upon the discretion of your surgeon and anesthesiologist your surgery may need to be rescheduled.  Do not eat food after midnight the night before surgery.  No gum chewing, lozengers or hard candies.  You may however, drink CLEAR liquids up to 2 hours before you are scheduled to arrive for your surgery. Do not drink anything within 2 hours of your scheduled arrival time.  Clear liquids include: - water  - apple juice without pulp - gatorade (not RED, PURPLE, OR BLUE) - black coffee or tea (Do NOT add milk or creamers to the coffee or tea) Do NOT drink anything that is not on this list.  TAKE THESE MEDICATIONS THE MORNING OF SURGERY WITH A SIP OF WATER:  1.  Omeprazole - (take one the night before and one on the morning of surgery - helps to prevent nausea after surgery.) 2.  Oxycodone if needed for pain  One week prior to surgery: Stop aspirin, Anti-inflammatories (NSAIDS) such as Advil, Aleve, Ibuprofen, Motrin, Naproxen, Naprosyn and Aspirin based products such as Excedrin, Goodys Powder, BC Powder. Stop ANY OVER THE COUNTER supplements until after surgery.  No Alcohol for 24 hours before or after surgery.  No Smoking including e-cigarettes for 24 hours prior to surgery.  No chewable tobacco products for at least 6 hours prior to surgery.  No nicotine patches on the day of surgery.  Do not use any "recreational" drugs for at least a week prior to your surgery.  Please be advised that the combination of cocaine and anesthesia may have negative outcomes, up to and including death. If you test positive for cocaine, your surgery will be  cancelled.  On the morning of surgery brush your teeth with toothpaste and water, you may rinse your mouth with mouthwash if you wish. Do not swallow any toothpaste or mouthwash.  Do not wear jewelry, make-up, hairpins, clips or nail polish.  Do not wear lotions, powders, or perfumes.   Do not shave body from the neck down 48 hours prior to surgery just in case you cut yourself which could leave a site for infection.   Do not bring valuables to the hospital. [facility] is not responsible for any missing/lost belongings or valuables.   Notify your doctor if there is any change in your medical condition (cold, fever, infection).  Wear comfortable clothing (specific to your surgery type) to the hospital.  Plan for stool softeners for home use; pain medications have a tendency to cause constipation. You can also help prevent constipation by eating foods high in fiber such as fruits and vegetables and drinking plenty of fluids as your diet allows.  After surgery, you can help prevent lung complications by doing breathing exercises.  Take deep breaths and cough every 1-2 hours. Your doctor may order a device called an Incentive Spirometer to help you take deep breaths.  If you are being discharged the day of surgery, you will not be allowed to drive home. You will need a responsible adult (18 years or older) to drive you home and stay with you that night.   If you are taking public transportation, you will need to  have a responsible adult (18 years or older) with you. Please confirm with your physician that it is acceptable to use public transportation.   Please call the Pre-admissions Testing Dept. at [phone] if you have any questions about these instructions.  Visitation Policy:  Patients undergoing a surgery or procedure may have one family member or support person with them as long as that person is not COVID-19 positive or experiencing its symptoms.  That person may remain in  the waiting area during the procedure.

## 2020-09-27 ENCOUNTER — Ambulatory Visit: Payer: Self-pay | Admitting: Anesthesiology

## 2020-09-27 ENCOUNTER — Encounter: Payer: Self-pay | Admitting: Orthopedic Surgery

## 2020-09-27 ENCOUNTER — Other Ambulatory Visit: Payer: Self-pay

## 2020-09-27 ENCOUNTER — Ambulatory Visit
Admission: RE | Admit: 2020-09-27 | Discharge: 2020-09-27 | Disposition: A | Discharge status: Home / Self Care | Payer: Self-pay | Attending: Orthopedic Surgery | Admitting: Orthopedic Surgery

## 2020-09-27 ENCOUNTER — Encounter
Admission: RE | Disposition: A | Discharge status: Home / Self Care | Payer: Self-pay | Source: Home / Self Care | Attending: Orthopedic Surgery

## 2020-09-27 ENCOUNTER — Ambulatory Visit: Payer: Self-pay

## 2020-09-27 DIAGNOSIS — W19XXXD Unspecified fall, subsequent encounter: Secondary | ICD-10-CM | POA: Insufficient documentation

## 2020-09-27 DIAGNOSIS — Z20822 Contact with and (suspected) exposure to covid-19: Secondary | ICD-10-CM | POA: Insufficient documentation

## 2020-09-27 DIAGNOSIS — Z419 Encounter for procedure for purposes other than remedying health state, unspecified: Secondary | ICD-10-CM

## 2020-09-27 DIAGNOSIS — Z87891 Personal history of nicotine dependence: Secondary | ICD-10-CM | POA: Insufficient documentation

## 2020-09-27 DIAGNOSIS — S82852D Displaced trimalleolar fracture of left lower leg, subsequent encounter for closed fracture with routine healing: Secondary | ICD-10-CM | POA: Insufficient documentation

## 2020-09-27 DIAGNOSIS — S93432D Sprain of tibiofibular ligament of left ankle, subsequent encounter: Secondary | ICD-10-CM | POA: Insufficient documentation

## 2020-09-27 HISTORY — PX: ORIF ANKLE FRACTURE: SHX5408

## 2020-09-27 HISTORY — DX: Gastro-esophageal reflux disease without esophagitis: K21.9

## 2020-09-27 LAB — URINE DRUG SCREEN, QUALITATIVE (ARMC ONLY)
Amphetamines, Ur Screen: NOT DETECTED
Barbiturates, Ur Screen: NOT DETECTED
Benzodiazepine, Ur Scrn: POSITIVE — AB
Cannabinoid 50 Ng, Ur ~~LOC~~: NOT DETECTED
Cocaine Metabolite,Ur ~~LOC~~: NOT DETECTED
MDMA (Ecstasy)Ur Screen: NOT DETECTED
Methadone Scn, Ur: NOT DETECTED
Opiate, Ur Screen: POSITIVE — AB
Phencyclidine (PCP) Ur S: NOT DETECTED
Tricyclic, Ur Screen: NOT DETECTED

## 2020-09-27 SURGERY — OPEN REDUCTION INTERNAL FIXATION (ORIF) ANKLE FRACTURE
Anesthesia: General | Site: Ankle | Laterality: Left

## 2020-09-27 MED ORDER — ACETAMINOPHEN 10 MG/ML IV SOLN
INTRAVENOUS | Status: AC
  Filled 2020-09-27: qty 100

## 2020-09-27 MED ORDER — FENTANYL CITRATE (PF) 100 MCG/2ML IJ SOLN
INTRAMUSCULAR | Status: AC
  Administered 2020-09-27: 25 ug via INTRAVENOUS
  Filled 2020-09-27: qty 2

## 2020-09-27 MED ORDER — HYDROMORPHONE HCL 1 MG/ML IJ SOLN
INTRAMUSCULAR | Status: AC
  Filled 2020-09-27: qty 1

## 2020-09-27 MED ORDER — CEFAZOLIN SODIUM-DEXTROSE 2-4 GM/100ML-% IV SOLN
INTRAVENOUS | Status: AC
  Filled 2020-09-27: qty 100

## 2020-09-27 MED ORDER — ACETAMINOPHEN 500 MG PO TABS: 1000.0000 mg | ORAL_TABLET | Freq: Three times a day (TID) | ORAL | 2 refills | Status: AC

## 2020-09-27 MED ORDER — BUPIVACAINE HCL (PF) 0.5 % IJ SOLN
INTRAMUSCULAR | Status: AC
  Filled 2020-09-27: qty 30

## 2020-09-27 MED ORDER — ACETAMINOPHEN 10 MG/ML IV SOLN
1000.0000 mg | Freq: Once | INTRAVENOUS | Status: DC | PRN
Start: 2020-09-27 — End: 2020-09-27

## 2020-09-27 MED ORDER — PROPOFOL 10 MG/ML IV BOLUS
INTRAVENOUS | Status: DC | PRN
  Administered 2020-09-27: 200 mg via INTRAVENOUS

## 2020-09-27 MED ORDER — FENTANYL CITRATE (PF) 100 MCG/2ML IJ SOLN
25.0000 ug | INTRAMUSCULAR | Status: AC | PRN
  Administered 2020-09-27 (×4): 25 ug via INTRAVENOUS

## 2020-09-27 MED ORDER — OXYCODONE HCL 5 MG PO TABS: 5.0000 mg | ORAL_TABLET | Freq: Once | ORAL | Status: AC | PRN

## 2020-09-27 MED ORDER — PROPOFOL 10 MG/ML IV BOLUS
INTRAVENOUS | Status: AC
  Filled 2020-09-27: qty 20

## 2020-09-27 MED ORDER — LABETALOL HCL 5 MG/ML IV SOLN
INTRAVENOUS | Status: AC
  Administered 2020-09-27: 10 mg via INTRAVENOUS
  Filled 2020-09-27: qty 4

## 2020-09-27 MED ORDER — KETOROLAC TROMETHAMINE 30 MG/ML IJ SOLN
INTRAMUSCULAR | Status: AC
  Filled 2020-09-27: qty 1

## 2020-09-27 MED ORDER — FENTANYL CITRATE (PF) 100 MCG/2ML IJ SOLN
INTRAMUSCULAR | Status: DC | PRN
  Administered 2020-09-27: 25 ug via INTRAVENOUS
  Administered 2020-09-27: 100 ug via INTRAVENOUS
  Administered 2020-09-27 (×5): 25 ug via INTRAVENOUS

## 2020-09-27 MED ORDER — FENTANYL CITRATE (PF) 250 MCG/5ML IJ SOLN
INTRAMUSCULAR | Status: AC
  Filled 2020-09-27: qty 5

## 2020-09-27 MED ORDER — FENTANYL CITRATE (PF) 100 MCG/2ML IJ SOLN
25.0000 ug | INTRAMUSCULAR | Status: AC | PRN
  Administered 2020-09-27 (×2): 25 ug via INTRAVENOUS

## 2020-09-27 MED ORDER — ONDANSETRON HCL 4 MG/2ML IJ SOLN
INTRAMUSCULAR | Status: AC
  Filled 2020-09-27: qty 2

## 2020-09-27 MED ORDER — OXYCODONE HCL 5 MG/5ML PO SOLN: 5.0000 mg | Freq: Once | ORAL | Status: AC | PRN

## 2020-09-27 MED ORDER — DEXMEDETOMIDINE HCL 200 MCG/2ML IV SOLN
INTRAVENOUS | Status: DC | PRN
  Administered 2020-09-27: 20 ug via INTRAVENOUS

## 2020-09-27 MED ORDER — LIDOCAINE HCL (CARDIAC) PF 100 MG/5ML IV SOSY
PREFILLED_SYRINGE | INTRAVENOUS | Status: DC | PRN
  Administered 2020-09-27: 100 mg via INTRAVENOUS

## 2020-09-27 MED ORDER — LIDOCAINE HCL 1 % IJ SOLN
INTRAMUSCULAR | Status: DC | PRN
  Administered 2020-09-27: 30 mL

## 2020-09-27 MED ORDER — MIDAZOLAM HCL 2 MG/2ML IJ SOLN
INTRAMUSCULAR | Status: AC
  Filled 2020-09-27: qty 2

## 2020-09-27 MED ORDER — OXYCODONE HCL 5 MG PO TABS
ORAL_TABLET | ORAL | Status: AC
  Administered 2020-09-27: 5 mg via ORAL
  Filled 2020-09-27: qty 1

## 2020-09-27 MED ORDER — DEXAMETHASONE SODIUM PHOSPHATE 10 MG/ML IJ SOLN
INTRAMUSCULAR | Status: AC
  Filled 2020-09-27: qty 1

## 2020-09-27 MED ORDER — HYDROMORPHONE HCL 1 MG/ML IJ SOLN
INTRAMUSCULAR | Status: DC | PRN
  Administered 2020-09-27 (×2): 1 mg via INTRAVENOUS

## 2020-09-27 MED ORDER — ONDANSETRON HCL 4 MG/2ML IJ SOLN: 4.0000 mg | Freq: Once | INTRAMUSCULAR | Status: AC | PRN

## 2020-09-27 MED ORDER — CHLORHEXIDINE GLUCONATE 0.12 % MT SOLN
OROMUCOSAL | Status: AC
  Administered 2020-09-27: 15 mL via OROMUCOSAL
  Filled 2020-09-27: qty 15

## 2020-09-27 MED ORDER — KETOROLAC TROMETHAMINE 30 MG/ML IJ SOLN
INTRAMUSCULAR | Status: DC | PRN
  Administered 2020-09-27: 30 mg via INTRAVENOUS

## 2020-09-27 MED ORDER — LABETALOL HCL 5 MG/ML IV SOLN
10.0000 mg | INTRAVENOUS | Status: DC | PRN
Start: 2020-09-27 — End: 2020-09-27

## 2020-09-27 MED ORDER — ASPIRIN EC 325 MG PO TBEC: 325.0000 mg | DELAYED_RELEASE_TABLET | Freq: Every day | ORAL | 0 refills | Status: AC

## 2020-09-27 MED ORDER — LIDOCAINE HCL (PF) 2 % IJ SOLN
INTRAMUSCULAR | Status: AC
  Filled 2020-09-27: qty 5

## 2020-09-27 MED ORDER — ONDANSETRON 4 MG PO TBDP: 4.0000 mg | ORAL_TABLET | Freq: Three times a day (TID) | ORAL | 0 refills | Status: DC | PRN

## 2020-09-27 MED ORDER — LIDOCAINE HCL (PF) 1 % IJ SOLN
INTRAMUSCULAR | Status: AC
  Filled 2020-09-27: qty 30

## 2020-09-27 MED ORDER — MIDAZOLAM HCL 2 MG/2ML IJ SOLN
INTRAMUSCULAR | Status: DC | PRN
  Administered 2020-09-27: 2 mg via INTRAVENOUS

## 2020-09-27 MED ORDER — ONDANSETRON HCL 4 MG/2ML IJ SOLN
INTRAMUSCULAR | Status: AC
  Administered 2020-09-27: 4 mg via INTRAVENOUS
  Filled 2020-09-27: qty 2

## 2020-09-27 MED ORDER — OXYCODONE HCL 5 MG PO TABS: 5.0000 mg | ORAL_TABLET | ORAL | 0 refills | Status: AC | PRN

## 2020-09-27 MED ORDER — ONDANSETRON HCL 4 MG/2ML IJ SOLN
INTRAMUSCULAR | Status: DC | PRN
  Administered 2020-09-27: 4 mg via INTRAVENOUS

## 2020-09-27 MED ORDER — DEXMEDETOMIDINE (PRECEDEX) IN NS 20 MCG/5ML (4 MCG/ML) IV SYRINGE
PREFILLED_SYRINGE | INTRAVENOUS | Status: AC
  Filled 2020-09-27: qty 5

## 2020-09-27 MED ORDER — DEXAMETHASONE SODIUM PHOSPHATE 10 MG/ML IJ SOLN
INTRAMUSCULAR | Status: DC | PRN
  Administered 2020-09-27: 10 mg via INTRAVENOUS

## 2020-09-27 MED ORDER — ACETAMINOPHEN 10 MG/ML IV SOLN
INTRAVENOUS | Status: DC | PRN
  Administered 2020-09-27: 1000 mg via INTRAVENOUS

## 2020-09-27 MED ORDER — BUPIVACAINE HCL (PF) 0.25 % IJ SOLN
INTRAMUSCULAR | Status: DC | PRN
  Administered 2020-09-27: 30 mL

## 2020-09-27 SURGICAL SUPPLY — 56 items
BANDAGE ELASTIC 4 VELCRO NS (GAUZE/BANDAGES/DRESSINGS) ×2 IMPLANT
BANDAGE ELASTIC 6 VELCRO NS (GAUZE/BANDAGES/DRESSINGS) ×2 IMPLANT
BIT DRILL 2.0 (BIT) ×2 IMPLANT
BIT DRILL 2.6 (BIT) ×2 IMPLANT
BLADE SURG SZ10 CARB STEEL (BLADE) ×4 IMPLANT
BNDG COHESIVE 4X5 TAN STRL (GAUZE/BANDAGES/DRESSINGS) ×2 IMPLANT
BNDG ESMARK 4X12 TAN STRL LF (GAUZE/BANDAGES/DRESSINGS) ×2 IMPLANT
CHLORAPREP W/TINT 26ML (MISCELLANEOUS) ×4 IMPLANT
CUFF TOURN SGL QUICK 30 (TOURNIQUET CUFF) ×2
CUFF TRNQT CYL 30X4X21-28X (TOURNIQUET CUFF) ×1 IMPLANT
DRAPE C-ARM XRAY 36X54 (DRAPES) ×2 IMPLANT
DRAPE C-ARMOR (DRAPES) ×2 IMPLANT
DRAPE IMP U-DRAPE 54X76 (DRAPES) ×2 IMPLANT
DRILL OVER 2.7X220 (BIT) ×2 IMPLANT
ELECT REM PT RETURN 9FT ADLT (ELECTROSURGICAL) ×2
ELECTRODE REM PT RTRN 9FT ADLT (ELECTROSURGICAL) ×1 IMPLANT
GAUZE SPONGE 4X4 12PLY STRL (GAUZE/BANDAGES/DRESSINGS) ×4 IMPLANT
GAUZE XEROFORM 1X8 LF (GAUZE/BANDAGES/DRESSINGS) ×2 IMPLANT
GLOVE BIO SURGEON STRL SZ7.5 (GLOVE) ×2 IMPLANT
GLOVE BIOGEL PI IND STRL 8 (GLOVE) ×1 IMPLANT
GLOVE BIOGEL PI INDICATOR 8 (GLOVE) ×1
GOWN STRL REIN 2XL XLG LVL4 (GOWN DISPOSABLE) ×2 IMPLANT
GOWN STRL REUS W/ TWL LRG LVL3 (GOWN DISPOSABLE) ×1 IMPLANT
GOWN STRL REUS W/TWL LRG LVL3 (GOWN DISPOSABLE) ×2
K-WIRE 1.4X100 (WIRE) ×4
KIT TURNOVER KIT A (KITS) ×2 IMPLANT
KWIRE 1.4X100 (WIRE) ×2 IMPLANT
NS IRRIG 500ML POUR BTL (IV SOLUTION) ×2 IMPLANT
PACK EXTREMITY ARMC (MISCELLANEOUS) ×2 IMPLANT
PADDING CAST 4IN STRL (MISCELLANEOUS) ×1
PADDING CAST BLEND 4X4 NS (MISCELLANEOUS) ×6 IMPLANT
PADDING CAST BLEND 4X4 STRL (MISCELLANEOUS) ×1 IMPLANT
PADDING CAST BLEND 6X4 STRL (MISCELLANEOUS) ×2 IMPLANT
PADDING STRL CAST 6IN (MISCELLANEOUS) ×2
PENCIL SMOKE EVACUATOR (MISCELLANEOUS) ×2 IMPLANT
PLATE FIBULA 5H (Plate) ×2 IMPLANT
SCREW BONE 14MMX3.5MM (Screw) ×4 IMPLANT
SCREW BONE 2.7X16MM (Screw) ×2 IMPLANT
SCREW LOCK 3.5X14 (Screw) ×2 IMPLANT
SCREW LOCKING 3.5X16MM (Screw) ×2 IMPLANT
SCREW LOCKING 3.5X18MM (Screw) ×2 IMPLANT
SPLINT FAST PLASTER 5X30 (CAST SUPPLIES) ×1
SPLINT PLASTER CAST FAST 5X30 (CAST SUPPLIES) ×1 IMPLANT
SPONGE LAP 18X18 RF (DISPOSABLE) ×4 IMPLANT
STAPLER SKIN PROX 35W (STAPLE) ×2 IMPLANT
STOCKINETTE STRL 6IN 960660 (GAUZE/BANDAGES/DRESSINGS) ×2 IMPLANT
SUT ETHILON 3-0 FS-10 30 BLK (SUTURE) ×2
SUT VIC AB 0 CT1 27 (SUTURE) ×2
SUT VIC AB 0 CT1 27XCR 8 STRN (SUTURE) ×1 IMPLANT
SUT VIC AB 0 CT1 36 (SUTURE) ×2 IMPLANT
SUT VIC AB 2-0 CT2 27 (SUTURE) ×2 IMPLANT
SUT VIC AB 3-0 SH 27 (SUTURE) ×2
SUT VIC AB 3-0 SH 27X BRD (SUTURE) ×1 IMPLANT
SUTURE EHLN 3-0 FS-10 30 BLK (SUTURE) ×1 IMPLANT
SYNDESMOSIS TIGHTROPE XP (Orthopedic Implant) ×2 IMPLANT
TOWEL OR 17X26 4PK STRL BLUE (TOWEL DISPOSABLE) ×2 IMPLANT

## 2020-09-27 NOTE — Transfer of Care (Signed)
Immediate Anesthesia Transfer of Care Note  Patient: Chad Walsh  Procedure(s) Performed: OPEN REDUCTION INTERNAL FIXATION (ORIF) ANKLE FRACTURE, SYNDOSIS REPAIR (Left Ankle)  Patient Location: PACU  Anesthesia Type:General  Level of Consciousness: awake  Airway & Oxygen Therapy: Patient Spontanous Breathing and Patient connected to face mask oxygen  Post-op Assessment: Report given to RN and Post -op Vital signs reviewed and stable  Post vital signs: Reviewed and stable  Last Vitals:  Vitals Value Taken Time  BP 148/93 09/27/20 1405  Temp 36.8 C 09/27/20 1405  Pulse 105 09/27/20 1408  Resp 9 09/27/20 1408  SpO2 95 % 09/27/20 1408  Vitals shown include unvalidated device data.  Last Pain:  Vitals:   09/27/20 0935  TempSrc: Temporal  PainSc: 5          Complications: No complications documented.

## 2020-09-27 NOTE — H&P (Signed)
Paper H&P to be scanned into permanent record. H&P reviewed. No significant changes noted.  

## 2020-09-27 NOTE — Discharge Instructions (Signed)
Ankle Fracture Surgery  Post-Op Instructions  1. Bracing or crutches: Crutches will be provided at the time of discharge.  2. Splint/Cast: You will have a splint (3/4 cast) on your leg after surgery. Ensure that this remains clean and dry until follow up appointment. If this becomes wet, you need to call our offices to get it changed or else you risk skin breakdown.    3. Driving:  Plan on not driving for at least four to six weeks. Please note that you are advised NOT to drive while taking narcotic pain medications as you may be impaired and unsafe to drive.  4. Activity: Ankle pumps several times an hour while awake to prevent blood clots. Weight bearing: Non-weight bearing. Use crutches for at least 6 weeks, if not longer based on your surgery. Bending and straightening the knee is unlimited. Elevate knee above heart level as much as possible for one week. Avoid standing more than 5 minutes (consecutively) for the first week.  Avoid long distance travel for 4 weeks.  5. Medications:  - You have been provided a prescription for narcotic pain medicine. After surgery, take 1-2 narcotic tablets every 4 hours if needed for severe pain. Please start this as soon as you begin to start having pain (if you received a nerve block, start taking as soon as this wears off).  - A prescription for anti-nausea medication will be provided in case the narcotic medicine causes nausea - take 1 tablet every 6 hours only if nauseated.  - Take enteric coated aspirin 325 mg once daily for 4 weeks to prevent blood clots.  -Take tylenol 1000 mg every 8 hours at baseline for pain.  May stop tylenol 5 days after surgery if you are having minimal pain.  If you are taking prescription medication for anxiety, depression, insomnia, muscle spasm, chronic pain, or for attention deficit disorder you are advised that you are at a higher risk of adverse effects with use of narcotics post-op, including narcotic  addiction/dependence, depressed breathing, death. If you use non-prescribed substances: alcohol, marijuana, cocaine, heroin, methamphetamines, etc., you are at a higher risk of adverse effects with use of narcotics post-op, including narcotic addiction/dependence, depressed breathing, death. You are advised that taking > 50 morphine milligram equivalents (MME) of narcotic pain medication per day results in twice the risk of overdose or death. For your prescription provided: oxycodone 5 mg - taking more than 6 tablets per day. Be advised that we will prescribe narcotics short-term, for acute post-operative pain only - 1 week for minor operations such as knee arthroscopy for meniscus tear resection, and 3 weeks for major operations such as knee repair/reconstruction surgeries.   6. Physical Therapy: Plan to start after follow up appointment at 2 weeks. 1-2 times per week for ~12-16 weeks. Therapy typically starts on post operative Day 3 or 4. You have been provided an order for physical therapy. The therapist will provide home exercises. Please contact our offices if this appointment has not been scheduled.   7. Work/School: May return to full work when off of crutches. May do light duty/desk job or return to school in approximately 1-2 weeks when off of narcotics, pain is well-controlled, and swelling has decreased.  8. Post-Op Appointments: Your first post-op appointment will be with Dr. Allena Katz in approximately 2 weeks time.   If you find that they have not been scheduled please call the Orthopaedic Appointment front desk at [phone].  AMBULATORY SURGERY  DISCHARGE INSTRUCTIONS   1) The  drugs that you were given will stay in your system until tomorrow so for the next 24 hours you should not:  A) Drive an automobile B) Make any legal decisions C) Drink any alcoholic beverage   2) You may resume regular meals tomorrow.  Today it is better to start with liquids and gradually work up to  solid foods.  You may eat anything you prefer, but it is better to start with liquids, then soup and crackers, and gradually work up to solid foods.   3) Please notify your doctor immediately if you have any unusual bleeding, trouble breathing, redness and pain at the surgery site, drainage, fever, or pain not relieved by medication.    4) Additional Instructions:    Please contact your physician with any problems or Same Day Surgery at [phone], Monday through Friday 6 am to 4 pm, or [place] at [facility] number at [phone].

## 2020-09-27 NOTE — Op Note (Signed)
Operative Note    SURGERY DATE: 09/27/2020   PRE-OP DIAGNOSIS:  1.  Trimalleolar equivalent left ankle fracture 2. Syndesmosis injury of left ankle   POST-OP DIAGNOSIS:  1.  Trimalleolar equivalent left ankle fracture 2. Syndesmosis injury of left ankle  PROCEDURE(S): 1. ORIF R distal fibula 2. Syndesmosis repair of R ankle   SURGEON: Rosealee Albee, MD    ANESTHESIA: Regional  + Gen   ESTIMATED BLOOD LOSS: 25cc   DRAINS:  None   TOTAL IV FLUIDS: see anesthesia record  IMPLANTS: Stryker VariAx distal fibular plate 3 - 2.7PO Stryker locking screws distally 2 - 3.3mm Stryker cortical screws proximally 1 -  2.4mm Stryker lag screws 1 -  Arthrex Syndesmosis Tightrope XP  INDICATION(S): The patient is a 40 y.o. male who had a fall ~2 weeks ago while intoxicated. He presented to the emergency department approximately 2-3 after injury and imaging showed a distal fibula fracture with butterfly fragment, small posterior fracture, and potential syndesmosis injury with lateral talar displacement.  He underwent a closed reduction and imaging showed an acceptable reduction.  Surgery was recommended as an outpatient given the amount of soft tissue swelling at that time.  Additionally, he was noted to have small subdural hematomas.  He was evaluated by the neurosurgery team and follow-up imaging showed stable subdural hematomas, and he was subsequently cleared for surgical intervention. After discussion of risks, benefits, and alternatives to surgery, the patient elected to proceed.     OPERATIVE FINDINGS: Unstable distal fibula fracture with butterfly fragment and syndesmosis injury   OPERATIVE REPORT:   The patient was seen in the Holding Room. The risks, benefits, complications, treatment options, and expected outcomes were discussed with the patient. The risks and potential complications of the problem and proposed treatment include but are not limited to infection, bleeding, pain,  stiffness, nerve and vessel injury, hardware failure, malunion/nonunion, and complication secondary to the anesthetic. The patient concurred with the proposed plan, giving informed consent.  The site of surgery was properly noted/marked.    The patient was taken to Operating Room and transferred to the operating room table. A Time Out was held and the patient identity, procedure, and laterality was confirmed. After administration of adequate anesthesia, the entire lower extremity was prescrubbed with Hibiclens and alcohol, prepped with Chloroprep, and draped in sterile fashion. The patient was given pre-operative IV antibiotics within 30 minutes of the skin incision.   The tourniquet was inflated to after exsanguinating the leg with an Esmarch bandage. A standard distal fibular incision was made with a 15 blade along the lateral aspect of the fibula. The superficial peroneal nerve was not noted within the surgical field.  Dissection was carried down sharply to the fibula. The fracture site was identified and cleared of any tissue with a combination of dental pick, knife, and curette.  A reduction clamp was placed and the fracture was reduced. A 2.28mm lag screw was placed in an A-P fashion through the fracture line. The reduction clamp was removed and the fracture remained appropriately reduced.  Appropriate reduction was confirmed visually and fluoroscopically.   A Stryker VariAx Distal Fibula plate was selected after confirming appropriate size and position fluoroscopically.  Three locking screws were placed in the distal fragment. Then 2 additional cortical screws were placed in the proximal fragment.  These achieved excellent fixation.  Fluoroscopy then confirmed appropriate hardware position and reduction. External rotation stress view was positive with increased medial clear space widening.  Therefore, we turned our attention to the syndesmosis repair. Under fluoroscopy, a 3.73mm drill was  used to drill across both the fibula and tibia, confirming appropriate position with fluoroscopy. A syndesmosis tightrope was passed through the far tibial cortex. The medial button was pulled back and flipped with confirmation with fluoroscopy. The button on the lateral side was advanced, tightening the tightrope until it was flush against the bone.  Once final tightening was achieved, we performed repeat external rotation stress test, and it was now normal.  The wound was then thoroughly irrigated.  Local anesthetic was placed in the subcutaneous and deep tissues.  The tourniquet was deflated with a time of 70 minutes. Hemostasis was achieved with bovie electrocautery. 0 Vicryl sutures were used to close the deep layer over the plate. 2-0 Vicryl was used to close the subdermal layer. Staples were used to close the skin. The wound was dressed with xeroform, fluffs, and cotton guaze.  The leg was then placed in a short leg splint. The patient was awakened from anesthesia without any further complication and transferred to PACU for further recovery.    POST-OPERATIVE PLAN:  Patient will be discharged to home. The patient will be NWB for 6 weeks. ASA x 4 weeks for DVT ppx. Follow up in 2 weeks as an outpatient.

## 2020-09-27 NOTE — Anesthesia Procedure Notes (Signed)
Procedure Name: LMA Insertion Performed by: Shaleen Talamantez R, CRNA Pre-anesthesia Checklist: Patient identified, Emergency Drugs available, Suction available and Patient being monitored Patient Re-evaluated:Patient Re-evaluated prior to induction Oxygen Delivery Method: Circle system utilized Preoxygenation: Pre-oxygenation with 100% oxygen Induction Type: IV induction LMA: LMA inserted LMA Size: 4.0 Tube type: Oral Number of attempts: 1 Placement Confirmation: positive ETCO2 and breath sounds checked- equal and bilateral Tube secured with: Tape Dental Injury: Teeth and Oropharynx as per pre-operative assessment        

## 2020-09-27 NOTE — Anesthesia Preprocedure Evaluation (Addendum)
Anesthesia Evaluation  Patient identified by MRN, date of birth, ID band Patient awake    Reviewed: Allergy & Precautions, NPO status , Patient's Chart, lab work & pertinent test results  History of Anesthesia Complications Negative for: history of anesthetic complications  Airway Mallampati: III  TM Distance: >3 FB Neck ROM: Full    Dental  (+) Poor Dentition   Pulmonary neg pulmonary ROS, neg sleep apnea, neg COPD, Patient abstained from smoking.Not current smoker, former smoker,    Pulmonary exam normal breath sounds clear to auscultation       Cardiovascular Exercise Tolerance: Good METS(-) hypertension(-) CAD and (-) Past MI negative cardio ROS  (-) dysrhythmias  Rhythm:Regular Rate:Normal - Systolic murmurs    Neuro/Psych PSYCHIATRIC DISORDERS Anxiety Depression Small subdural hematoma s/p fall recently; neurosurgery had evaluated and said for outpatient follow up in a few weeks    GI/Hepatic GERD  Medicated and Controlled,(+)     substance abuse  alcohol use, Patient had been a heavy drinker but has not drank alcohol in a few weeks. Has never had withdrawal seizures but has had "shakes"   Endo/Other  neg diabetes  Renal/GU negative Renal ROS     Musculoskeletal   Abdominal   Peds  Hematology   Anesthesia Other Findings Past Medical History: No date: Anxiety No date: Depression No date: ETOH abuse No date: GERD (gastroesophageal reflux disease)  Reproductive/Obstetrics                            Anesthesia Physical Anesthesia Plan  ASA: II  Anesthesia Plan: General   Post-op Pain Management:  Regional for Post-op pain   Induction: Intravenous  PONV Risk Score and Plan: 2 and Ondansetron, Dexamethasone and Midazolam  Airway Management Planned: LMA  Additional Equipment: None  Intra-op Plan:   Post-operative Plan: Extubation in OR  Informed Consent: I have reviewed  the patients History and Physical, chart, labs and discussed the procedure including the risks, benefits and alternatives for the proposed anesthesia with the patient or authorized representative who has indicated his/her understanding and acceptance.     Dental advisory given  Plan Discussed with: CRNA and Surgeon  Anesthesia Plan Comments: (Discussed risks of anesthesia with patient, including PONV, sore throat, lip/dental damage. Rare risks discussed as well, such as cardiorespiratory and neurological sequelae. Patient understands.  Discussed possiblity of post op nerve blocks. Discussed r/b/a of adductor canal nerve block and popliteal block, including:  - bleeding, infection, nerve damage - poor or non functioning block. Patient understands. )        Anesthesia Quick Evaluation

## 2020-09-28 ENCOUNTER — Encounter: Payer: Self-pay | Admitting: Orthopedic Surgery

## 2020-09-28 NOTE — Anesthesia Postprocedure Evaluation (Signed)
Anesthesia Post Note  Patient: Chad Walsh  Procedure(s) Performed: OPEN REDUCTION INTERNAL FIXATION (ORIF) ANKLE FRACTURE, SYNDOSIS REPAIR (Left Ankle)  Patient location during evaluation: PACU Anesthesia Type: General Level of consciousness: awake and alert Pain management: pain level controlled Vital Signs Assessment: post-procedure vital signs reviewed and stable Respiratory status: spontaneous breathing, nonlabored ventilation, respiratory function stable and patient connected to nasal cannula oxygen Cardiovascular status: blood pressure returned to baseline and stable Postop Assessment: no apparent nausea or vomiting Anesthetic complications: no   No complications documented.   Last Vitals:  Vitals:   09/27/20 1530 09/27/20 1540  BP: (!) 150/94 (!) 140/92  Pulse: 100   Resp: 15   Temp:    SpO2: 93%     Last Pain:  Vitals:   09/27/20 1519  TempSrc:   PainSc: 5                  Corinda Gubler

## 2020-11-20 ENCOUNTER — Encounter: Payer: Self-pay | Admitting: Emergency Medicine

## 2020-11-20 ENCOUNTER — Emergency Department
Admission: EM | Admit: 2020-11-20 | Discharge: 2020-11-20 | Disposition: A | Discharge status: Home / Self Care | Payer: Self-pay | Attending: Emergency Medicine | Admitting: Emergency Medicine

## 2020-11-20 DIAGNOSIS — F4321 Adjustment disorder with depressed mood: Secondary | ICD-10-CM | POA: Diagnosis present

## 2020-11-20 DIAGNOSIS — F1910 Other psychoactive substance abuse, uncomplicated: Secondary | ICD-10-CM | POA: Insufficient documentation

## 2020-11-20 DIAGNOSIS — F10920 Alcohol use, unspecified with intoxication, uncomplicated: Secondary | ICD-10-CM

## 2020-11-20 DIAGNOSIS — Z87891 Personal history of nicotine dependence: Secondary | ICD-10-CM | POA: Insufficient documentation

## 2020-11-20 DIAGNOSIS — F1010 Alcohol abuse, uncomplicated: Secondary | ICD-10-CM | POA: Insufficient documentation

## 2020-11-20 DIAGNOSIS — F10129 Alcohol abuse with intoxication, unspecified: Secondary | ICD-10-CM | POA: Insufficient documentation

## 2020-11-20 DIAGNOSIS — Y906 Blood alcohol level of 120-199 mg/100 ml: Secondary | ICD-10-CM | POA: Insufficient documentation

## 2020-11-20 DIAGNOSIS — F322 Major depressive disorder, single episode, severe without psychotic features: Secondary | ICD-10-CM

## 2020-11-20 DIAGNOSIS — F419 Anxiety disorder, unspecified: Secondary | ICD-10-CM | POA: Insufficient documentation

## 2020-11-20 DIAGNOSIS — F32A Depression, unspecified: Secondary | ICD-10-CM | POA: Insufficient documentation

## 2020-11-20 LAB — URINE DRUG SCREEN, QUALITATIVE (ARMC ONLY)
Amphetamines, Ur Screen: NOT DETECTED
Amphetamines, Ur Screen: NOT DETECTED
Barbiturates, Ur Screen: NOT DETECTED
Barbiturates, Ur Screen: NOT DETECTED
Benzodiazepine, Ur Scrn: NOT DETECTED
Benzodiazepine, Ur Scrn: NOT DETECTED
Cannabinoid 50 Ng, Ur ~~LOC~~: NOT DETECTED
Cannabinoid 50 Ng, Ur ~~LOC~~: NOT DETECTED
Cocaine Metabolite,Ur ~~LOC~~: NOT DETECTED
Cocaine Metabolite,Ur ~~LOC~~: NOT DETECTED
MDMA (Ecstasy)Ur Screen: NOT DETECTED
MDMA (Ecstasy)Ur Screen: NOT DETECTED
Methadone Scn, Ur: NOT DETECTED
Methadone Scn, Ur: NOT DETECTED
Opiate, Ur Screen: POSITIVE — AB
Opiate, Ur Screen: NOT DETECTED
Phencyclidine (PCP) Ur S: NOT DETECTED
Phencyclidine (PCP) Ur S: NOT DETECTED
Tricyclic, Ur Screen: NOT DETECTED
Tricyclic, Ur Screen: NOT DETECTED

## 2020-11-20 LAB — COMPREHENSIVE METABOLIC PANEL
ALT: 30 U/L (ref 0–44)
AST: 53 U/L — ABNORMAL HIGH (ref 15–41)
Albumin: 5 g/dL (ref 3.5–5.0)
Alkaline Phosphatase: 98 U/L (ref 38–126)
Anion gap: 14 (ref 5–15)
BUN: 8 mg/dL (ref 6–20)
CO2: 23 mmol/L (ref 22–32)
Calcium: 9.1 mg/dL (ref 8.9–10.3)
Chloride: 97 mmol/L — ABNORMAL LOW (ref 98–111)
Creatinine, Ser: 0.74 mg/dL (ref 0.61–1.24)
GFR, Estimated: >60 mL/min (ref >60)
Glucose, Bld: 96 mg/dL (ref 70–99)
Potassium: 3.3 mmol/L — ABNORMAL LOW (ref 3.5–5.1)
Sodium: 134 mmol/L — ABNORMAL LOW (ref 135–145)
Total Bilirubin: 1.5 mg/dL — ABNORMAL HIGH (ref 0.3–1.2)
Total Protein: 8.3 g/dL — ABNORMAL HIGH (ref 6.5–8.1)

## 2020-11-20 LAB — CBC WITH DIFFERENTIAL/PLATELET
Abs Immature Granulocytes: 0.05 10*3/uL (ref 0.00–0.07)
Basophils Absolute: 0.1 10*3/uL (ref 0.0–0.1)
Basophils Relative: 1 %
Eosinophils Absolute: 0 10*3/uL (ref 0.0–0.5)
Eosinophils Relative: 0 %
HCT: 35.6 % — ABNORMAL LOW (ref 39.0–52.0)
Hemoglobin: 13 g/dL (ref 13.0–17.0)
Immature Granulocytes: 0 %
Lymphocytes Relative: 27 %
Lymphs Abs: 3.1 10*3/uL (ref 0.7–4.0)
MCH: 33.8 pg (ref 26.0–34.0)
MCHC: 36.5 g/dL — ABNORMAL HIGH (ref 30.0–36.0)
MCV: 92.5 fL (ref 80.0–100.0)
Monocytes Absolute: 0.9 10*3/uL (ref 0.1–1.0)
Monocytes Relative: 7 %
Neutro Abs: 7.6 10*3/uL (ref 1.7–7.7)
Neutrophils Relative %: 65 %
Platelets: 297 10*3/uL (ref 150–400)
RBC: 3.85 MIL/uL — ABNORMAL LOW (ref 4.22–5.81)
RDW: 13.4 % (ref 11.5–15.5)
WBC: 11.7 10*3/uL — ABNORMAL HIGH (ref 4.0–10.5)
nRBC: 0 % (ref 0.0–0.2)

## 2020-11-20 LAB — ACETAMINOPHEN LEVEL: Acetaminophen (Tylenol), Serum: <10 ug/mL — ABNORMAL LOW (ref 10–30)

## 2020-11-20 LAB — ETHANOL: Alcohol, Ethyl (B): 191 mg/dL — ABNORMAL HIGH (ref <10)

## 2020-11-20 LAB — SALICYLATE LEVEL: Salicylate Lvl: <7 mg/dL — ABNORMAL LOW (ref 7.0–30.0)

## 2020-11-20 MED ORDER — THIAMINE HCL 100 MG PO TABS: 100.0000 mg | ORAL_TABLET | Freq: Once | ORAL | Status: DC

## 2020-11-20 MED ORDER — ADULT MULTIVITAMIN W/MINERALS CH: 1.0000 | ORAL_TABLET | Freq: Once | ORAL | Status: DC

## 2020-11-20 MED ORDER — ONDANSETRON 4 MG PO TBDP
4.0000 mg | ORAL_TABLET | Freq: Once | ORAL | Status: AC
  Administered 2020-11-20: 4 mg via ORAL
  Filled 2020-11-20: qty 1

## 2020-11-20 NOTE — Consult Note (Signed)
[facility] Face-to-Face Psychiatry Consult   Reason for Consult: Depression and Anxiety Referring Physician: Dr. Katrinka Blazing Patient Identification: Chad Walsh MRN:  [id number] Principal Diagnosis: <principal problem not specified> Diagnosis:  Active Problems:   Alcohol abuse   Depression   Grieving   Total Time spent with patient: 1 hour  Subjective: " I watch my mom suffer before she died." Chad Walsh is a 40 y.o. male patient presented to [facility] ED via [facility] from home, where he called out to come to talk with someone about his increased depression and anxiety. The patient disclosed he lost his mother in November 2021, and since his loss, he has been experiencing increased depression and anxiety. During the patient assessment, he is highly emotional, experiencing some tremors and voicing how the loss of his mother is difficult. The patient reviewed that he drinks consistently has not eaten a meal for almost a week. The patient disclosed he does not have much of a support system. He voiced he has a person that is a friend, but he is not very close to him. He disclosed he has a sister and his father, but none of them he feels supports him during his loss. The patient was also referred to [facility] to be seen to start on an antidepressant. He agreed and stated he would follow up with [facility] when discharged. The patient was seen face-to-face by this provider; the chart was reviewed and consulted with Dr. Katrinka Blazing on 11/20/2020 due to the patient's care. It was discussed with the EDP that the patient is not a safety risk to himself or others and does not require psychiatric inpatient admission for stabilization and treatment. It is recommended to the patient that he seek grief counseling and get into a detox treatment program for his alcohol abuse.  On evaluation, the patient is alert and oriented x 4, very emotional but cooperative, and mood-congruent with affect. The patient does not appear to be responding  to internal or external stimuli. Neither is the patient presenting with any delusional thinking. The patient denies auditory or visual hallucinations. The patient denies any suicidal, homicidal, or self-harm ideations. The patient is not presenting with any psychotic or paranoid behaviors. During an encounter with the patient, he was able to answer questions appropriately.  Disposition: The patient is not a safety risk to himself or others and does not require psychiatric inpatient admission for stabilization and treatment.  It is recommended to the patient that he seek grief counseling and get into a detox treatment program for his alcohol abuse.    HPI: Per Dr. Katrinka Blazing, Chad Walsh is a 40 y.o. malewho presents to the ED for evaluation of depression and anxiety.  Chart review indicates history of anxiety, depression and ethanol abuse. Patient was just seen early this morning, about 12 hours ago, by my colleagues.  Reports mixing alcohol and oxycodone for the past week.  Patient observed until clinically sober, and upon reassessment indicates no suicidality or homicidality.  He was discharged home with return precautions.  Patient returns to the ED this evening requesting to speak with a psychologist or psychiatrist.  He reports his mother passed about 4 months ago, and he has been increasingly depressed, anxious and relying on ethanol as a coping mechanism.  He denies additional recreational drugs.  He reports drinking about 12 beers today after leaving the ED this morning and before coming in today, but denies any oxycodone today.  He reports, tearfully, increased depression and is requesting help.  Indicates that he does not want to go to rehab.  He explicitly denies any suicidal thoughts or plans, hallucinations or homicidality.  Denies recent illnesses, fever, trauma, abdominal pain, emesis, chest pain or syncope.   Past Psychiatric History: Anxiety Depression Alcohol abuse  Risk  to Self:  No Risk to Others:  No Prior Inpatient Therapy:  No Prior Outpatient Therapy:  No  Past Medical History:  Past Medical History:  Diagnosis Date  . Anxiety   . Depression   . ETOH abuse   . GERD (gastroesophageal reflux disease)     Past Surgical History:  Procedure Laterality Date  . ORIF ANKLE FRACTURE Left 09/27/2020   Procedure: OPEN REDUCTION INTERNAL FIXATION (ORIF) ANKLE FRACTURE, SYNDOSIS REPAIR;  Surgeon: Signa Kell, MD;  Location: [facility] ORS;  Service: Orthopedics;  Laterality: Left;  . TONSILLECTOMY    . WISDOM TOOTH EXTRACTION     Family History:  Family History  Problem Relation Age of Onset  . Lung cancer Mother    Family Psychiatric  History: Unknown Social History:  Social History   Substance and Sexual Activity  Alcohol Use Yes  . Alcohol/week: 28.0 standard drinks  . Types: 28 Standard drinks or equivalent per week   Comment: 09/25/20 - pt reports no alcohol last 2 weeks     Social History   Substance and Sexual Activity  Drug Use Not Currently  . Types: Marijuana   Comment: early 2021 last used    Social History   Socioeconomic History  . Marital status: Single    Spouse name: Not on file  . Number of children: Not on file  . Years of education: Not on file  . Highest education level: Not on file  Occupational History  . Not on file  Tobacco Use  . Smoking status: Former Smoker    Packs/day: 0.50    Years: 18.00    Pack years: 9.00    Types: Cigarettes    Quit date: 03/25/2020    Years since quitting: 0.6  . Smokeless tobacco: Never Used  Vaping Use  . Vaping Use: Never used  Substance and Sexual Activity  . Alcohol use: Yes    Alcohol/week: 28.0 standard drinks    Types: 28 Standard drinks or equivalent per week    Comment: 09/25/20 - pt reports no alcohol last 2 weeks  . Drug use: Not Currently    Types: Marijuana    Comment: early 2021 last used  . Sexual activity: Not on file  Other Topics Concern  . Not on file  Social  History Narrative  . Not on file   Social Determinants of Health   Financial Resource Strain: Not on file  Food Insecurity: Not on file  Transportation Needs: Not on file  Physical Activity: Not on file  Stress: Not on file  Social Connections: Not on file   Additional Social History:    Allergies:  No Known Allergies  Labs:  Results for orders placed or performed during the hospital encounter of 11/20/20 (from the past 48 hour(s))  Urine Drug Screen, Qualitative     Status: None   Collection Time: 11/20/20  8:21 PM  Result Value Ref Range   Tricyclic, Ur Screen NONE DETECTED NONE DETECTED   Amphetamines, Ur Screen NONE DETECTED NONE DETECTED   MDMA (Ecstasy)Ur Screen NONE DETECTED NONE DETECTED   Cocaine Metabolite,Ur [place] NONE DETECTED NONE DETECTED   Opiate, Ur Screen NONE DETECTED NONE DETECTED   Phencyclidine (PCP) Ur S NONE  DETECTED NONE DETECTED   Cannabinoid 50 Ng, Ur [place] NONE DETECTED NONE DETECTED   Barbiturates, Ur Screen NONE DETECTED NONE DETECTED   Benzodiazepine, Ur Scrn NONE DETECTED NONE DETECTED   Methadone Scn, Ur NONE DETECTED NONE DETECTED    Comment: (NOTE) Tricyclics + metabolites, urine    Cutoff 1000 ng/mL Amphetamines + metabolites, urine  Cutoff 1000 ng/mL MDMA (Ecstasy), urine              Cutoff 500 ng/mL Cocaine Metabolite, urine          Cutoff 300 ng/mL Opiate + metabolites, urine        Cutoff 300 ng/mL Phencyclidine (PCP), urine         Cutoff 25 ng/mL Cannabinoid, urine                 Cutoff 50 ng/mL Barbiturates + metabolites, urine  Cutoff 200 ng/mL Benzodiazepine, urine              Cutoff 200 ng/mL Methadone, urine                   Cutoff 300 ng/mL  The urine drug screen provides only a preliminary, unconfirmed analytical test result and should not be used for non-medical purposes. Clinical consideration and professional judgment should be applied to any positive drug screen result due to possible interfering substances. A more  specific alternate chemical method must be used in order to obtain a confirmed analytical result. Gas chromatography / mass spectrometry (GC/MS) is the preferred confirm atory method. Performed at [facility], [address] [place], [place] [address]     No current facility-administered medications for this encounter.   Current Outpatient Medications  Medication Sig Dispense Refill  . acetaminophen (TYLENOL) 500 MG tablet Take 2 tablets (1,000 mg total) by mouth every 8 (eight) hours. 90 tablet 2  . Cholecalciferol (VITAMIN D3) 50 MCG (2000 UT) CAPS Take by mouth daily.    . Multiple Vitamins-Minerals (MULTIVITAMIN MEN PO) Take by mouth daily.    Marland Kitchen. omeprazole (PRILOSEC) 20 MG capsule Take 20 mg by mouth daily.    . ondansetron (ZOFRAN ODT) 4 MG disintegrating tablet Take 1 tablet (4 mg total) by mouth every 8 (eight) hours as needed for nausea or vomiting. 20 tablet 0  . oxycodone (OXY-IR) 5 MG capsule Take 5 mg by mouth every 4 (four) hours as needed.    Marland Kitchen. oxyCODONE (ROXICODONE) 5 MG immediate release tablet Take 1-2 tablets (5-10 mg total) by mouth every 4 (four) hours as needed (pain). 30 tablet 0    Musculoskeletal: Strength & Muscle Tone: within normal limits Gait & Station: normal Patient leans: N/A  Psychiatric Specialty Exam:  Presentation  General Appearance: Appropriate for Environment; Fairly Groomed  Eye Contact:Good  Speech:Clear and Coherent  Speech Volume:Normal  Handedness:Right   Mood and Affect  Mood:Depressed; Hopeless  Affect:Blunt; Congruent; Depressed; Flat; Tearful   Thought Process  Thought Processes:Coherent  Descriptions of Associations:Intact  Orientation:Full (Time, Place and Person)  Thought Content:Logical  History of Schizophrenia/Schizoaffective disorder:No data recorded Duration of Psychotic Symptoms:No data recorded Hallucinations:Hallucinations: None  Ideas of Reference:None  Suicidal Thoughts:Suicidal  Thoughts: No  Homicidal Thoughts:Homicidal Thoughts: No   Sensorium  Memory:Immediate Good; Recent Good; Remote Good  Judgment:Good  Insight:Fair   Executive Functions  Concentration:Good  Attention Span:Good  Recall:Good  Fund of Knowledge:Good  Language:Good   Psychomotor Activity  Psychomotor Activity:Psychomotor Activity: Normal   Assets  Assets:Desire for Improvement; Intimacy; Resilience; Social  Support   Sleep  Sleep:Sleep: Poor   Physical Exam: Physical Exam Vitals and nursing note reviewed.  Constitutional:      Appearance: Normal appearance. He is obese.  HENT:     Right Ear: External ear normal.     Left Ear: External ear normal.     Nose: Nose normal.  Eyes:     Conjunctiva/sclera: Conjunctivae normal.  Cardiovascular:     Rate and Rhythm: Tachycardia present.  Pulmonary:     Effort: Pulmonary effort is normal.  Musculoskeletal:        General: Normal range of motion.     Cervical back: Normal range of motion and neck supple.  Neurological:     Mental Status: He is alert and oriented to person, place, and time. Mental status is at baseline.  Psychiatric:        Attention and Perception: Attention and perception normal.        Mood and Affect: Mood is anxious and depressed.        Speech: Speech normal.        Behavior: Behavior normal.        Thought Content: Thought content normal.        Cognition and Memory: Cognition and memory normal.        Judgment: Judgment normal.    Review of Systems  Psychiatric/Behavioral: Positive for depression. The patient is nervous/anxious and has insomnia.   All other systems reviewed and are negative.  Blood pressure (!) 144/88, pulse (!) 102, temperature 97.8 F (36.6 C), temperature source Oral, resp. rate 18, height 5\' 8"  (1.727 m), weight 91.6 kg, SpO2 98 %. Body mass index is 30.71 kg/m.  Treatment Plan Summary: Plan The patient is not a safety risk to himself or others and does not  require psychiatric inpatient admission for stabilization and treatment.  It is recommended to the patient that he seek grief counseling and get into a detox treatment program for his alcohol abuse.  Disposition: No evidence of imminent risk to self or others at present.   Patient does not meet criteria for psychiatric inpatient admission. Supportive therapy provided about ongoing stressors. Refer to IOP. Discussed crisis plan, support from social network, calling 911, coming to the Emergency Department, and calling Suicide Hotline. The patient is not a safety risk to himself or others and does not require psychiatric inpatient admission for stabilization and treatment.  It is recommended to the patient that he seek grief counseling and get into a detox treatment program for his alcohol abuse.  , NP 11/20/2020 9:55 PM

## 2020-11-20 NOTE — ED Notes (Signed)
Sign pad not working. Discharge paperwork reviewed with patient. Patient states verbally he understands.

## 2020-11-20 NOTE — ED Notes (Signed)
Pt unable to obtain ride home after several attempts. Pt states that he has no money for [employer] or taxi. Cab voucher given

## 2020-11-20 NOTE — ED Provider Notes (Signed)
[facility] Emergency Department Provider Note ____________________________________________   Event Date/Time   First MD Initiated Contact with Patient 11/20/20 2005     (approximate)  I have reviewed the triage vital signs and the nursing notes.  HISTORY  Chief Complaint Depression and Anxiety   HPI Chad Walsh is a 40 y.o. malewho presents to the ED for evaluation of depression and anxiety.  Chart review indicates history of anxiety, depression and ethanol abuse. Patient was just seen early this morning, about 12 hours ago, by my colleagues.  Reports mixing alcohol and oxycodone for the past week.  Patient observed until clinically sober, and upon reassessment indicates no suicidality or homicidality.  He was discharged home with return precautions.  Patient returns to the ED this evening requesting to speak with a psychologist or psychiatrist.  He reports his mother passed about 4 months ago, and he has been increasingly depressed, anxious and relying on ethanol as a coping mechanism.  He denies additional recreational drugs.  He reports drinking about 12 beers today after leaving the ED this morning and before coming in today, but denies any oxycodone today.  He reports, tearfully, increased depression and is requesting help.  Indicates that he does not want to go to rehab.  He explicitly denies any suicidal thoughts or plans, hallucinations or homicidality.  Denies recent illnesses, fever, trauma, abdominal pain, emesis, chest pain or syncope.   Past Medical History:  Diagnosis Date  . Anxiety   . Depression   . ETOH abuse   . GERD (gastroesophageal reflux disease)     Patient Active Problem List   Diagnosis Date Noted  . Closed left ankle fracture 09/16/2020  . Transaminitis 09/16/2020  . Hypokalemia 09/16/2020  . Subdural hematoma, post-traumatic (HCC) 09/16/2020  . Thrombocytopenia (HCC) 09/16/2020  . Alcohol abuse 08/01/2020  .  Depression 08/01/2020  . Grieving 08/01/2020  . Gastric reflux 08/01/2020  . Vitamin D deficiency 08/01/2020    Past Surgical History:  Procedure Laterality Date  . ORIF ANKLE FRACTURE Left 09/27/2020   Procedure: OPEN REDUCTION INTERNAL FIXATION (ORIF) ANKLE FRACTURE, SYNDOSIS REPAIR;  Surgeon: Signa Kell, MD;  Location: [facility] ORS;  Service: Orthopedics;  Laterality: Left;  . TONSILLECTOMY    . WISDOM TOOTH EXTRACTION      Prior to Admission medications   Medication Sig Start Date End Date Taking? Authorizing Provider  acetaminophen (TYLENOL) 500 MG tablet Take 2 tablets (1,000 mg total) by mouth every 8 (eight) hours. 09/27/20 09/27/21  Signa Kell, MD  Cholecalciferol (VITAMIN D3) 50 MCG (2000 UT) CAPS Take by mouth daily.    [provider]  Multiple Vitamins-Minerals (MULTIVITAMIN MEN PO) Take by mouth daily.    [provider]  omeprazole (PRILOSEC) 20 MG capsule Take 20 mg by mouth daily.    [provider]  ondansetron (ZOFRAN ODT) 4 MG disintegrating tablet Take 1 tablet (4 mg total) by mouth every 8 (eight) hours as needed for nausea or vomiting. 09/27/20   Signa Kell, MD  oxycodone (OXY-IR) 5 MG capsule Take 5 mg by mouth every 4 (four) hours as needed.    [provider]  oxyCODONE (ROXICODONE) 5 MG immediate release tablet Take 1-2 tablets (5-10 mg total) by mouth every 4 (four) hours as needed (pain). 09/27/20 09/27/21  Signa Kell, MD    Allergies Patient has no known allergies.  Family History  Problem Relation Age of Onset  . Lung cancer Mother     Social History  Social History   Tobacco Use  . Smoking status: Former Smoker    Packs/day: 0.50    Years: 18.00    Pack years: 9.00    Types: Cigarettes    Quit date: 03/25/2020    Years since quitting: 0.6  . Smokeless tobacco: Never Used  Vaping Use  . Vaping Use: Never used  Substance Use Topics  . Alcohol use: Yes    Alcohol/week: 28.0 standard drinks    Types: 28 Standard  drinks or equivalent per week    Comment: 09/25/20 - pt reports no alcohol last 2 weeks  . Drug use: Not Currently    Types: Marijuana    Comment: early 2021 last used    Review of Systems  Constitutional: No fever/chills Eyes: No visual changes. ENT: No sore throat. Cardiovascular: Denies chest pain. Respiratory: Denies shortness of breath. Gastrointestinal: No abdominal pain.  No nausea, no vomiting.  No diarrhea.  No constipation. Genitourinary: Negative for dysuria. Musculoskeletal: Negative for back pain. Skin: Negative for rash. Neurological: Negative for headaches, focal weakness or numbness.  ____________________________________________   PHYSICAL EXAM:  VITAL SIGNS: Vitals:   11/20/20 1925  BP: (!) 144/88  Pulse: (!) 102  Resp: 18  Temp: 97.8 F (36.6 C)  SpO2: 98%      Constitutional: Alert and oriented. Well appearing and in no acute distress.  Tearful and with linear thought processes.  Seems slightly inebriated, but is not dysarthric.  He is ambulatory with a normal gait independently. Eyes: Conjunctivae are normal. PERRL. EOMI. Head: Atraumatic. Nose: No congestion/rhinnorhea. Mouth/Throat: Mucous membranes are moist.  Oropharynx non-erythematous. Neck: No stridor. No cervical spine tenderness to palpation. Cardiovascular: Normal rate, regular rhythm. Grossly normal heart sounds.  Good peripheral circulation. Respiratory: Normal respiratory effort.  No retractions. Lungs CTAB. Gastrointestinal: Soft , nondistended, nontender to palpation. No CVA tenderness. Musculoskeletal: No lower extremity tenderness nor edema.  No joint effusions. No signs of acute trauma. Neurologic:  Normal speech and language. No gross focal neurologic deficits are appreciated. No gait instability noted. Skin:  Skin is warm, dry and intact. No rash noted. Psychiatric: Mood and affect are normal. Speech and behavior are normal.  ____________________________________________    LABS (all labs ordered are listed, but only abnormal results are displayed)  Labs Reviewed  COMPREHENSIVE METABOLIC PANEL  ETHANOL  SALICYLATE LEVEL  ACETAMINOPHEN LEVEL  CBC  URINE DRUG SCREEN, QUALITATIVE ([facility] ONLY)    ____________________________________________   PROCEDURES and INTERVENTIONS  Procedure(s) performed (including Critical Care):  Procedures  Medications - No data to display  ____________________________________________   MDM / ED COURSE   40 year old male presents to the ED voluntarily requesting to speak with psychiatry due to acute depression and associated alcohol abuse.  No evidence of medical pathology to preclude psychiatric evaluation.  No evidence of psychiatric emergency such as suicidality, mania or psychoses to necessitate IVC at this time.  We will have psychiatry come evaluate the patient and disposition per their recommendations.     ____________________________________________   FINAL CLINICAL IMPRESSION(S) / ED DIAGNOSES  Final diagnoses:  Alcohol abuse     ED Discharge Orders    None       Natalio Salois Katrinka Blazing   Note:  This document was prepared using Dragon voice recognition software and may include unintentional dictation errors.   Delton [place], MD 11/20/20 2119

## 2020-11-20 NOTE — ED Notes (Signed)
Pt oob to br without assistance.

## 2020-11-20 NOTE — ED Triage Notes (Signed)
FIRST NURSE NOTE: pt comes into the ED via [place] [employer] officer voluntarily , states he has been going through a hard time and leaning on alcohol, pt is intoxicated on arrival. States he was seen here earlier today for similar issues

## 2020-11-20 NOTE — ED Triage Notes (Signed)
Pt arrived via [facility] from home where he called out to come talk with someone about his increased depression and anxiety. Per pt he lost his mother in November and the loss has increased his sadness. Pt reports he has battled with both anxiety and depression but never sought help or medication for it before. Pt reports he wants help now due to turning to alcohol in the last 2 weeks to cope and concerns he will do that again. Pt cooperative and tearful in triage. Pt denies SI and HI as well as need for detox. Pt seeking help with control of depression and anxiety.

## 2020-11-20 NOTE — ED Provider Notes (Signed)
-----------------------------------------   7:05 AM on 11/20/2020 -----------------------------------------  Blood pressure (!) 196/122, pulse (!) 113, temperature 98.2 F (36.8 C), temperature source Oral, resp. rate 20, SpO2 99 %.  Assuming care from Dr. Elesa Massed.  In short, Chad Walsh is a 40 y.o. male with a chief complaint of Drug / Alcohol Assessment .  Refer to the original H&P for additional details.  The current plan of care is to follow-up labs and reassess once clinically sober.  ----------------------------------------- 8:57 AM on 11/20/2020 -----------------------------------------  Lab work is unremarkable outside of elevated blood alcohol level.  Patient now appears clinically sober and is ambulating without difficulty.  On reassessment, patient states he was primarily concerned about the effect of mixing alcohol and painkillers.  He reiterates that he is not interested in detox and again denies any suicidal or homicidal ideation.  He is appropriate for discharge home with PCP follow-up, was counseled to return to the ED for any new or worsening symptoms.  Patient agrees with plan.    Chesley Noon, MD 11/20/20 [phone]

## 2020-11-20 NOTE — Discharge Instructions (Addendum)
Steps to find a Primary Care Provider (PCP):  Call [phone] or [phone] to access "[facility] Find a Doctor Service."  2.  You may also go on the [facility] website at [email or web address]  

## 2020-11-20 NOTE — ED Provider Notes (Signed)
[facility] Emergency Department Provider Note  ____________________________________________   Event Date/Time   First MD Initiated Contact with Patient 11/20/20 [phone]     (approximate)  I have reviewed the triage vital signs and the nursing notes.   HISTORY  Chief Complaint Drug / Alcohol Assessment    HPI Chad Walsh is a 40 y.o. male with history of alcohol abuse, anxiety and depression who presents to the emergency department with EMS given concerns for his continued alcohol and drug abuse.  States he has been on "a bender" since last Wednesday, 6 days ago.  Reports he has not had any alcohol he reports since Friday the 25th but states he did have oxycodone tonight.  States he took 5 mg at 4 AM.  He denies that he was trying to harm himself.  He denies HI or hallucinations.  States "I just got worried".  He is unable to provide any further history.  Denies any other illicit drug use.  Denies any pain.        Past Medical History:  Diagnosis Date  . Anxiety   . Depression   . ETOH abuse   . GERD (gastroesophageal reflux disease)     Patient Active Problem List   Diagnosis Date Noted  . Closed left ankle fracture 09/16/2020  . Transaminitis 09/16/2020  . Hypokalemia 09/16/2020  . Subdural hematoma, post-traumatic (HCC) 09/16/2020  . Thrombocytopenia (HCC) 09/16/2020  . Alcohol abuse 08/01/2020  . Depression 08/01/2020  . Grieving 08/01/2020  . Gastric reflux 08/01/2020  . Vitamin D deficiency 08/01/2020    Past Surgical History:  Procedure Laterality Date  . ORIF ANKLE FRACTURE Left 09/27/2020   Procedure: OPEN REDUCTION INTERNAL FIXATION (ORIF) ANKLE FRACTURE, SYNDOSIS REPAIR;  Surgeon: Signa Kell, MD;  Location: [facility] ORS;  Service: Orthopedics;  Laterality: Left;  . TONSILLECTOMY    . WISDOM TOOTH EXTRACTION      Prior to Admission medications   Medication Sig Start Date End Date Taking? Authorizing Provider  acetaminophen  (TYLENOL) 500 MG tablet Take 2 tablets (1,000 mg total) by mouth every 8 (eight) hours. 09/27/20 09/27/21  Signa Kell, MD  Cholecalciferol (VITAMIN D3) 50 MCG (2000 UT) CAPS Take by mouth daily.    [provider]  Multiple Vitamins-Minerals (MULTIVITAMIN MEN PO) Take by mouth daily.    [provider]  omeprazole (PRILOSEC) 20 MG capsule Take 20 mg by mouth daily.    [provider]  ondansetron (ZOFRAN ODT) 4 MG disintegrating tablet Take 1 tablet (4 mg total) by mouth every 8 (eight) hours as needed for nausea or vomiting. 09/27/20   Signa Kell, MD  oxycodone (OXY-IR) 5 MG capsule Take 5 mg by mouth every 4 (four) hours as needed.    [provider]  oxyCODONE (ROXICODONE) 5 MG immediate release tablet Take 1-2 tablets (5-10 mg total) by mouth every 4 (four) hours as needed (pain). 09/27/20 09/27/21  Signa Kell, MD    Allergies Patient has no known allergies.  Family History  Problem Relation Age of Onset  . Lung cancer Mother     Social History Social History   Tobacco Use  . Smoking status: Former Smoker    Packs/day: 0.50    Years: 18.00    Pack years: 9.00    Types: Cigarettes    Quit date: 03/25/2020    Years since quitting: 0.6  . Smokeless tobacco: Never Used  Vaping Use  . Vaping Use: Never used  Substance Use  Topics  . Alcohol use: Yes    Alcohol/week: 28.0 standard drinks    Types: 28 Standard drinks or equivalent per week    Comment: 09/25/20 - pt reports no alcohol last 2 weeks  . Drug use: Not Currently    Types: Marijuana    Comment: early 2021 last used    Review of Systems Level 5 caveat secondary to intoxication, patient being a very poor historian  ____________________________________________   PHYSICAL EXAM:  VITAL SIGNS: ED Triage Vitals [11/20/20 0643]  Enc Vitals Group     BP (!) 196/122     Pulse Rate (!) 113     Resp 20     Temp 98.2 F (36.8 C)     Temp Source Oral     SpO2 99 %     Weight      Height       Head Circumference      Peak Flow      Pain Score      Pain Loc      Pain Edu?      Excl. in GC?    CONSTITUTIONAL: Alert and oriented and responds appropriately to questions is not extremely poor historian.  Appears intoxicated. HEAD: Normocephalic, atraumatic EYES: Conjunctivae clear, pupils appear equal, EOM appear intact ENT: normal nose; moist mucous membranes NECK: Supple, normal ROM CARD: Regular and tachycardic; S1 and S2 appreciated; no murmurs, no clicks, no rubs, no gallops RESP: Normal chest excursion without splinting or tachypnea; breath sounds clear and equal bilaterally; no wheezes, no rhonchi, no rales, no hypoxia or respiratory distress, speaking full sentences ABD/GI: Normal bowel sounds; non-distended; soft, non-tender, no rebound, no guarding, no peritoneal signs, no hepatosplenomegaly BACK: The back appears normal EXT: Normal ROM in all joints; no deformity noted, no edema; no cyanosis SKIN: Normal color for age and race; warm; no rash on exposed skin NEURO: Moves all extremities equally, slightly slurred speech, no facial asymmetry, normal gait, not tremulous PSYCH: The patient's mood and manner are appropriate.  Denies SI, HI or hallucinations.  ____________________________________________   LABS (all labs ordered are listed, but only abnormal results are displayed)  Labs Reviewed  CBC WITH DIFFERENTIAL/PLATELET  COMPREHENSIVE METABOLIC PANEL  ETHANOL  ACETAMINOPHEN LEVEL  SALICYLATE LEVEL  URINE DRUG SCREEN, QUALITATIVE ([facility] ONLY)  CBG MONITORING, ED   ____________________________________________  EKG  none ____________________________________________  RADIOLOGY I, Nandana Krolikowski, personally viewed and evaluated these images (plain radiographs) as part of my medical decision making, as well as reviewing the written report by the radiologist.  ED MD interpretation:  none  Official radiology report(s): No results  found.  ____________________________________________   PROCEDURES  Procedure(s) performed (including Critical Care):  Procedures  ____________________________________________   INITIAL IMPRESSION / ASSESSMENT AND PLAN / ED COURSE  As part of my medical decision making, I reviewed the following data within the electronic MEDICAL RECORD NUMBER Nursing notes reviewed and incorporated, Old chart reviewed, Notes from prior ED visits and [place] Controlled Substance Database         Patient here with concerns for alcohol and drug abuse.  He declines wanting detox.  States he got concerned about taking oxycodone and alcohol but states his last drink was several days ago.  He appears very intoxicated here and is very difficult to get a clear history from him.  I feel he will at a minimum need to be monitored until clinically sober.  Will obtain screening labs, urine.  He denies SI, HI or hallucinations  at this point.  He does not appear to be in withdrawal.  Will reassess when clinically sober.  ED PROGRESS   Signed out to Dr. Larinda Buttery to follow up on patient's labs, urine and reassess patient/   I reviewed all nursing notes and pertinent previous records as available.  I have reviewed and interpreted any EKGs, lab and urine results, imaging (as available).  ____________________________________________   FINAL CLINICAL IMPRESSION(S) / ED DIAGNOSES  Final diagnoses:  Polysubstance abuse [facility])     ED Discharge Orders    None      *Please note:  Chad Walsh was evaluated in Emergency Department on 11/20/2020 for the symptoms described in the history of present illness. He was evaluated in the context of the global COVID-19 pandemic, which necessitated consideration that the patient might be at risk for infection with the SARS-CoV-2 virus that causes COVID-19. Institutional protocols and algorithms that pertain to the evaluation of patients at risk for COVID-19 are in a state of rapid  change based on information released by regulatory bodies including the CDC and federal and state organizations. These policies and algorithms were followed during the patient's care in the ED.  Some ED evaluations and interventions may be delayed as a result of limited staffing during and the pandemic.*   Note:  This document was prepared using Dragon voice recognition software and may include unintentional dictation errors.   Jolonda Gomm, Layla Maw, DO 11/20/20 0700

## 2020-11-20 NOTE — ED Notes (Signed)
Pt a/o, sleepy. Needs assistance to stand but is steady on feet. No s/s of withdrawal. See ciwa.

## 2020-11-20 NOTE — ED Triage Notes (Signed)
Pt arrived via [facility] from home where pt called out due to concerns from consuming alcohol while taking his oxycodone and tramadol. On assessment with MD at bedside, pt reports he has been on a "binder" since Wednesday 3/23, drinking alcohol and taking his prescribed Oxycodone 5mg . Pt reports last drink was on Friday and last oxycodone was taken at 0400 this AM. Pt has slurred speech, poor historian and slow to respond to questions asked. Pt denies SI and HI. Pt denies wanting detox.

## 2020-11-21 ENCOUNTER — Emergency Department
Admission: EM | Admit: 2020-11-21 | Discharge: 2020-11-22 | Disposition: A | Discharge status: Home / Self Care | Payer: Self-pay | Attending: Emergency Medicine | Admitting: Emergency Medicine

## 2020-11-21 ENCOUNTER — Encounter: Payer: Self-pay | Admitting: Emergency Medicine

## 2020-11-21 DIAGNOSIS — F329 Major depressive disorder, single episode, unspecified: Secondary | ICD-10-CM | POA: Insufficient documentation

## 2020-11-21 DIAGNOSIS — F32A Depression, unspecified: Secondary | ICD-10-CM | POA: Diagnosis present

## 2020-11-21 DIAGNOSIS — F4321 Adjustment disorder with depressed mood: Secondary | ICD-10-CM | POA: Diagnosis present

## 2020-11-21 DIAGNOSIS — Z20822 Contact with and (suspected) exposure to covid-19: Secondary | ICD-10-CM | POA: Insufficient documentation

## 2020-11-21 DIAGNOSIS — Y908 Blood alcohol level of 240 mg/100 ml or more: Secondary | ICD-10-CM | POA: Insufficient documentation

## 2020-11-21 DIAGNOSIS — F10229 Alcohol dependence with intoxication, unspecified: Secondary | ICD-10-CM | POA: Insufficient documentation

## 2020-11-21 DIAGNOSIS — Z87891 Personal history of nicotine dependence: Secondary | ICD-10-CM | POA: Insufficient documentation

## 2020-11-21 DIAGNOSIS — F1010 Alcohol abuse, uncomplicated: Secondary | ICD-10-CM | POA: Diagnosis present

## 2020-11-21 DIAGNOSIS — Z634 Disappearance and death of family member: Secondary | ICD-10-CM | POA: Insufficient documentation

## 2020-11-21 NOTE — ED Notes (Signed)
Pt reports drinking "several cocktails and about 7 bud lights" Pt also complains of left ankle pain, reports a surgery Feb. 2 on left ankle and tonight there is swelling noted in area.

## 2020-11-21 NOTE — ED Notes (Signed)
Pt has had 2 visits in about last day and is back tonight, pt is asking for help with his alcoholism now.

## 2020-11-21 NOTE — ED Triage Notes (Signed)
Pt arrived via [facility] from home where he called [facility] due to intoxication and depression. Sheriff on scene reported pt blew a 0.37. Pt calm and cooperative. Pt denies SI and HI as well as desire for detox. Pt reports increased sadness and decreased solid food intake.

## 2020-11-22 ENCOUNTER — Other Ambulatory Visit: Payer: Self-pay

## 2020-11-22 ENCOUNTER — Emergency Department
Admission: EM | Admit: 2020-11-22 | Discharge: 2020-11-23 | Disposition: A | Discharge status: Home / Self Care | Payer: Self-pay | Attending: Emergency Medicine | Admitting: Emergency Medicine

## 2020-11-22 DIAGNOSIS — F1010 Alcohol abuse, uncomplicated: Secondary | ICD-10-CM | POA: Diagnosis present

## 2020-11-22 DIAGNOSIS — F4321 Adjustment disorder with depressed mood: Secondary | ICD-10-CM | POA: Diagnosis present

## 2020-11-22 DIAGNOSIS — Z87891 Personal history of nicotine dependence: Secondary | ICD-10-CM | POA: Insufficient documentation

## 2020-11-22 DIAGNOSIS — F10288 Alcohol dependence with other alcohol-induced disorder: Secondary | ICD-10-CM | POA: Insufficient documentation

## 2020-11-22 DIAGNOSIS — F99 Mental disorder, not otherwise specified: Secondary | ICD-10-CM | POA: Insufficient documentation

## 2020-11-22 DIAGNOSIS — F10230 Alcohol dependence with withdrawal, uncomplicated: Secondary | ICD-10-CM | POA: Insufficient documentation

## 2020-11-22 DIAGNOSIS — F32A Depression, unspecified: Secondary | ICD-10-CM | POA: Diagnosis present

## 2020-11-22 LAB — URINE DRUG SCREEN, QUALITATIVE (ARMC ONLY)
Amphetamines, Ur Screen: NOT DETECTED
Barbiturates, Ur Screen: NOT DETECTED
Benzodiazepine, Ur Scrn: NOT DETECTED
Cannabinoid 50 Ng, Ur ~~LOC~~: NOT DETECTED
Cocaine Metabolite,Ur ~~LOC~~: NOT DETECTED
MDMA (Ecstasy)Ur Screen: NOT DETECTED
Methadone Scn, Ur: NOT DETECTED
Opiate, Ur Screen: NOT DETECTED
Phencyclidine (PCP) Ur S: NOT DETECTED
Tricyclic, Ur Screen: NOT DETECTED

## 2020-11-22 LAB — COMPREHENSIVE METABOLIC PANEL
ALT: 35 U/L (ref 0–44)
AST: 68 U/L — ABNORMAL HIGH (ref 15–41)
Albumin: 4.8 g/dL (ref 3.5–5.0)
Alkaline Phosphatase: 102 U/L (ref 38–126)
Anion gap: 15 (ref 5–15)
BUN: <5 mg/dL — ABNORMAL LOW (ref 6–20)
CO2: 25 mmol/L (ref 22–32)
Calcium: 8.8 mg/dL — ABNORMAL LOW (ref 8.9–10.3)
Chloride: 104 mmol/L (ref 98–111)
Creatinine, Ser: 0.75 mg/dL (ref 0.61–1.24)
GFR, Estimated: >60 mL/min (ref >60)
Glucose, Bld: 129 mg/dL — ABNORMAL HIGH (ref 70–99)
Potassium: 3.7 mmol/L (ref 3.5–5.1)
Sodium: 144 mmol/L (ref 135–145)
Total Bilirubin: 1.1 mg/dL (ref 0.3–1.2)
Total Protein: 8 g/dL (ref 6.5–8.1)

## 2020-11-22 LAB — CBC
HCT: 36.8 % — ABNORMAL LOW (ref 39.0–52.0)
Hemoglobin: 12.8 g/dL — ABNORMAL LOW (ref 13.0–17.0)
MCH: 32.5 pg (ref 26.0–34.0)
MCHC: 34.8 g/dL (ref 30.0–36.0)
MCV: 93.4 fL (ref 80.0–100.0)
Platelets: 269 10*3/uL (ref 150–400)
RBC: 3.94 MIL/uL — ABNORMAL LOW (ref 4.22–5.81)
RDW: 13.2 % (ref 11.5–15.5)
WBC: 9.2 10*3/uL (ref 4.0–10.5)
nRBC: 0 % (ref 0.0–0.2)

## 2020-11-22 LAB — RESP PANEL BY RT-PCR (FLU A&B, COVID) ARPGX2
Influenza A by PCR: NEGATIVE
Influenza B by PCR: NEGATIVE
SARS Coronavirus 2 by RT PCR: NEGATIVE

## 2020-11-22 LAB — ETHANOL: Alcohol, Ethyl (B): 301 mg/dL (ref <10)

## 2020-11-22 MED ORDER — THIAMINE HCL 100 MG PO TABS
100.0000 mg | ORAL_TABLET | Freq: Every day | ORAL | Status: DC
  Administered 2020-11-22: 100 mg via ORAL
  Filled 2020-11-22: qty 1

## 2020-11-22 MED ORDER — DIPHENHYDRAMINE HCL 25 MG PO CAPS
50.0000 mg | ORAL_CAPSULE | Freq: Once | ORAL | Status: AC
  Administered 2020-11-22: 50 mg via ORAL
  Filled 2020-11-22: qty 2

## 2020-11-22 MED ORDER — LORAZEPAM 2 MG/ML IJ SOLN: 0.0000 mg | Freq: Two times a day (BID) | INTRAMUSCULAR | Status: DC

## 2020-11-22 MED ORDER — LORAZEPAM 2 MG PO TABS: 0.0000 mg | ORAL_TABLET | Freq: Two times a day (BID) | ORAL | Status: DC

## 2020-11-22 MED ORDER — CHLORDIAZEPOXIDE HCL 25 MG PO CAPS
50.0000 mg | ORAL_CAPSULE | Freq: Three times a day (TID) | ORAL | Status: DC
  Administered 2020-11-22: 50 mg via ORAL
  Filled 2020-11-22: qty 2

## 2020-11-22 MED ORDER — THIAMINE HCL 100 MG/ML IJ SOLN: 100.0000 mg | Freq: Every day | INTRAMUSCULAR | Status: DC

## 2020-11-22 MED ORDER — LORAZEPAM 2 MG PO TABS
0.0000 mg | ORAL_TABLET | Freq: Four times a day (QID) | ORAL | Status: DC
  Administered 2020-11-22: 2 mg via ORAL
  Filled 2020-11-22: qty 1

## 2020-11-22 MED ORDER — LORAZEPAM 2 MG/ML IJ SOLN: 0.0000 mg | Freq: Four times a day (QID) | INTRAMUSCULAR | Status: DC

## 2020-11-22 NOTE — ED Notes (Signed)
Pt requesting to leave and go to [facility]- pt given bus information on how to get to [facility]

## 2020-11-22 NOTE — ED Notes (Signed)
Hourly rounding completed at this time, patient currently awake in hallway bed. No complaints, stable, and in no acute distress. Q15 minute rounds and monitoring via Rover and Officer to continue. 

## 2020-11-22 NOTE — ED Notes (Signed)
Pt complains of wanting sleep but pt will not lay down to attempt to sleep. PT constantly attempting to talk to any staff that will stop and speak and sitting on edge of bed

## 2020-11-22 NOTE — Consult Note (Signed)
[facility] Face-to-Face Psychiatry Consult   Reason for Consult: Depression and Anxiety Referring Physician: Dr. Katrinka Blazing Patient Identification: Chad Walsh MRN:  [id number] Principal Diagnosis: <principal problem not specified> Diagnosis:  Active Problems:   Alcohol abuse   Depression   Grieving   Total Time spent with patient: 20 minutes  Subjective: " I watch my mom suffer before she died." Chad Walsh is a 40 y.o. male patient presented to [facility] ED via [facility] from home, where he called [facility] due to increased grief, depression and anxiety. The patient BAL is 301 mg/dl and presenting with increased tremors.   The patient disclosed he lost his mother in November 2021, and since his loss, he has been experiencing increased depression and anxiety.   During the patient assessment, he is highly emotional, experiencing some tremors and voicing how the loss of his mother is difficult. The patient reviewed that he drinks consistently has not eaten a meal for almost a week. The patient disclosed he does not have much of a support system. He voiced he has a person that is a friend, but he is not very close to him. He disclosed he has a sister and his father, but none of them he feels supports him during his loss. The patient was also referred to [facility] to be seen to start on an antidepressant. He agreed and stated he would follow up with [facility] when discharged. The patient was seen face-to-face by this provider; the chart was reviewed and consulted with Dr. Don Perking on 11/22/2020 due to the patient's care. It was discussed with the EDP That the patient remained under observation overnight and will be reassessed in the a.m. to determine if she meets the criteria for psychiatric inpatient admission; he could be discharged back home. It is recommended to the patient that he seek grief counseling and get into a detox treatment program for his alcohol abuse.  On evaluation, the patient is alert and oriented x 4,  emotional but cooperative, and mood-congruent with affect. The patient does not appear to be responding to internal or external stimuli. Neither is the patient presenting with any delusional thinking. The patient denies auditory or visual hallucinations. The patient denies any suicidal, homicidal, or self-harm ideations. The patient is not presenting with any psychotic or paranoid behaviors. During an encounter with the patient, he was able to answer questions appropriately.  Disposition: The patient remained under observation overnight and will be reassessed in the a.m. to determine if she meets the criteria for psychiatric inpatient admission; he could be discharged back home.  It is recommended to the patient that he seek grief counseling and get into a detox treatment program for his alcohol abuse.    HPI: Per Dr. Chriss Driver is a 40 y.o. male with history of alcohol abuse, anxiety, depression who presents for evaluation of depression alcohol intoxication.  Patient call [facility] this evening for help with.  Police was contacted by [employer] for a Public house manager.  When they got to the house patient was extremely intoxicated and was brought here for evaluation.  Patient reports that he lost his mother in November and has been grieving.  He has been extremely depressed but denies any suicidal homicidal thoughts.  Patient does endorse drinking several beers a day and has been doing that for at least 3 years.  Denies any history of complicated withdrawals.  Denies any other substance use.  Denies any medical complaints.  Patient requesting help with detox   Past Psychiatric  History: Anxiety Depression Alcohol abuse  Risk to Self:  No Risk to Others:  No Prior Inpatient Therapy:  No Prior Outpatient Therapy:  No  Past Medical History:  Past Medical History:  Diagnosis Date  . Anxiety   . Depression   . ETOH abuse   . GERD (gastroesophageal reflux disease)     Past Surgical History:   Procedure Laterality Date  . ORIF ANKLE FRACTURE Left 09/27/2020   Procedure: OPEN REDUCTION INTERNAL FIXATION (ORIF) ANKLE FRACTURE, SYNDOSIS REPAIR;  Surgeon: Signa KellPatel, Sunny, MD;  Location: [facility] ORS;  Service: Orthopedics;  Laterality: Left;  . TONSILLECTOMY    . WISDOM TOOTH EXTRACTION     Family History:  Family History  Problem Relation Age of Onset  . Lung cancer Mother    Family Psychiatric  History: Unknown Social History:  Social History   Substance and Sexual Activity  Alcohol Use Yes  . Alcohol/week: 28.0 standard drinks  . Types: 28 Standard drinks or equivalent per week   Comment: 09/25/20 - pt reports no alcohol last 2 weeks     Social History   Substance and Sexual Activity  Drug Use Not Currently  . Types: Marijuana   Comment: early 2021 last used    Social History   Socioeconomic History  . Marital status: Single    Spouse name: Not on file  . Number of children: Not on file  . Years of education: Not on file  . Highest education level: Not on file  Occupational History  . Not on file  Tobacco Use  . Smoking status: Former Smoker    Packs/day: 0.50    Years: 18.00    Pack years: 9.00    Types: Cigarettes    Quit date: 03/25/2020    Years since quitting: 0.6  . Smokeless tobacco: Never Used  Vaping Use  . Vaping Use: Never used  Substance and Sexual Activity  . Alcohol use: Yes    Alcohol/week: 28.0 standard drinks    Types: 28 Standard drinks or equivalent per week    Comment: 09/25/20 - pt reports no alcohol last 2 weeks  . Drug use: Not Currently    Types: Marijuana    Comment: early 2021 last used  . Sexual activity: Not on file  Other Topics Concern  . Not on file  Social History Narrative  . Not on file   Social Determinants of Health   Financial Resource Strain: Not on file  Food Insecurity: Not on file  Transportation Needs: Not on file  Physical Activity: Not on file  Stress: Not on file  Social Connections: Not on file    Additional Social History:    Allergies:  No Known Allergies  Labs:  Results for orders placed or performed during the hospital encounter of 11/21/20 (from the past 48 hour(s))  CBC     Status: Abnormal   Collection Time: 11/22/20 12:13 AM  Result Value Ref Range   WBC 9.2 4.0 - 10.5 K/uL   RBC 3.94 (L) 4.22 - 5.81 MIL/uL   Hemoglobin 12.8 (L) 13.0 - 17.0 g/dL   HCT 40.936.8 (L) 81.139.0 - 91.452.0 %   MCV 93.4 80.0 - 100.0 fL   MCH 32.5 26.0 - 34.0 pg   MCHC 34.8 30.0 - 36.0 g/dL   RDW 78.213.2 95.611.5 - 21.315.5 %   Platelets 269 150 - 400 K/uL   nRBC 0.0 0.0 - 0.2 %    Comment: Performed at [facility], [address] [place], [place] [address]  Comprehensive metabolic panel     Status: Abnormal   Collection Time: 11/22/20 12:13 AM  Result Value Ref Range   Sodium 144 135 - 145 mmol/L   Potassium 3.7 3.5 - 5.1 mmol/L   Chloride 104 98 - 111 mmol/L   CO2 25 22 - 32 mmol/L   Glucose, Bld 129 (H) 70 - 99 mg/dL    Comment: Glucose reference range applies only to samples taken after fasting for at least 8 hours.   BUN <5 (L) 6 - 20 mg/dL   Creatinine, Ser 4.53 0.61 - 1.24 mg/dL   Calcium 8.8 (L) 8.9 - 10.3 mg/dL   Total Protein 8.0 6.5 - 8.1 g/dL   Albumin 4.8 3.5 - 5.0 g/dL   AST 68 (H) 15 - 41 U/L   ALT 35 0 - 44 U/L   Alkaline Phosphatase 102 38 - 126 U/L   Total Bilirubin 1.1 0.3 - 1.2 mg/dL   GFR, Estimated >64 >68 mL/min    Comment: (NOTE) Calculated using the CKD-EPI Creatinine Equation (2021)    Anion gap 15 5 - 15    Comment: Performed at [facility], [address] [place], [place] [address]  Ethanol     Status: Abnormal   Collection Time: 11/22/20 12:13 AM  Result Value Ref Range   Alcohol, Ethyl (B) 301 (HH) <10 mg/dL    Comment: CRITICAL RESULT CALLED TO, READ BACK BY AND VERIFIED WITH CHRIS BUCKNER @0047  ON 11/22/20 SKL (NOTE) Lowest detectable limit for serum alcohol is 10 mg/dL.  For medical purposes only. Performed at [facility],  [address] [place], [place]   Urine Drug Screen, Qualitative [facility] only)     Status: None   Collection Time: 11/22/20  1:09 AM  Result Value Ref Range   Tricyclic, Ur Screen NONE DETECTED NONE DETECTED   Amphetamines, Ur Screen NONE DETECTED NONE DETECTED   MDMA (Ecstasy)Ur Screen NONE DETECTED NONE DETECTED   Cocaine Metabolite,Ur [place] NONE DETECTED NONE DETECTED   Opiate, Ur Screen NONE DETECTED NONE DETECTED   Phencyclidine (PCP) Ur S NONE DETECTED NONE DETECTED   Cannabinoid 50 Ng, Ur [place] NONE DETECTED NONE DETECTED   Barbiturates, Ur Screen NONE DETECTED NONE DETECTED   Benzodiazepine, Ur Scrn NONE DETECTED NONE DETECTED   Methadone Scn, Ur NONE DETECTED NONE DETECTED    Comment: (NOTE) Tricyclics + metabolites, urine    Cutoff 1000 ng/mL Amphetamines + metabolites, urine  Cutoff 1000 ng/mL MDMA (Ecstasy), urine              Cutoff 500 ng/mL Cocaine Metabolite, urine          Cutoff 300 ng/mL Opiate + metabolites, urine        Cutoff 300 ng/mL Phencyclidine (PCP), urine         Cutoff 25 ng/mL Cannabinoid, urine                 Cutoff 50 ng/mL Barbiturates + metabolites, urine  Cutoff 200 ng/mL Benzodiazepine, urine              Cutoff 200 ng/mL Methadone, urine                   Cutoff 300 ng/mL  The urine drug screen provides only a preliminary, unconfirmed analytical test result and should not be used for non-medical purposes. Clinical consideration and professional judgment should be applied to any positive drug screen result due to possible  interfering substances. A more specific alternate chemical method must be used in order to obtain a confirmed analytical result. Gas chromatography / mass spectrometry (GC/MS) is the preferred confirm atory method. Performed at [facility], [address] [place], [place] [address]     Current Facility-Administered Medications  Medication Dose Route Frequency Provider Last Rate Last Admin  .  chlordiazePOXIDE (LIBRIUM) capsule 50 mg  50 mg Oral TID Nita Sickle, MD   50 mg at 11/22/20 6045   Current Outpatient Medications  Medication Sig Dispense Refill  . acetaminophen (TYLENOL) 500 MG tablet Take 2 tablets (1,000 mg total) by mouth every 8 (eight) hours. 90 tablet 2  . Cholecalciferol (VITAMIN D3) 50 MCG (2000 UT) CAPS Take by mouth daily.    . Multiple Vitamins-Minerals (MULTIVITAMIN MEN PO) Take by mouth daily.    Marland Kitchen omeprazole (PRILOSEC) 20 MG capsule Take 20 mg by mouth daily.    . ondansetron (ZOFRAN ODT) 4 MG disintegrating tablet Take 1 tablet (4 mg total) by mouth every 8 (eight) hours as needed for nausea or vomiting. 20 tablet 0  . oxycodone (OXY-IR) 5 MG capsule Take 5 mg by mouth every 4 (four) hours as needed.    Marland Kitchen oxyCODONE (ROXICODONE) 5 MG immediate release tablet Take 1-2 tablets (5-10 mg total) by mouth every 4 (four) hours as needed (pain). 30 tablet 0    Musculoskeletal: Strength & Muscle Tone: within normal limits Gait & Station: normal Patient leans: N/A  Psychiatric Specialty Exam:  Presentation  General Appearance: Appropriate for Environment; Fairly Groomed  Eye Contact:Good  Speech:Clear and Coherent  Speech Volume:Normal  Handedness:Right   Mood and Affect  Mood:Depressed; Hopeless  Affect:Blunt; Congruent; Depressed; Flat; Tearful   Thought Process  Thought Processes:Coherent  Descriptions of Associations:Intact  Orientation:Full (Time, Place and Person)  Thought Content:Logical  History of Schizophrenia/Schizoaffective disorder:No  Duration of Psychotic Symptoms:No data recorded Hallucinations:No data recorded  Ideas of Reference:None  Suicidal Thoughts:No data recorded  Homicidal Thoughts:No data recorded   Sensorium  Memory:Immediate Good; Recent Good; Remote Good  Judgment:Good  Insight:Fair   Executive Functions  Concentration:Good  Attention Span:Good  Recall:Good  Fund of  Knowledge:Good  Language:Good   Psychomotor Activity  Psychomotor Activity:No data recorded   Assets  Assets:Desire for Improvement; Intimacy; Resilience; Social Support   Sleep  Sleep:No data recorded   Physical Exam: Physical Exam Vitals and nursing note reviewed.  Constitutional:      Appearance: Normal appearance. He is obese.  HENT:     Right Ear: External ear normal.     Left Ear: External ear normal.     Nose: Nose normal.  Eyes:     Conjunctiva/sclera: Conjunctivae normal.  Cardiovascular:     Rate and Rhythm: Tachycardia present.  Pulmonary:     Effort: Pulmonary effort is normal.  Musculoskeletal:        General: Normal range of motion.     Cervical back: Normal range of motion and neck supple.  Neurological:     Mental Status: He is alert and oriented to person, place, and time. Mental status is at baseline.  Psychiatric:        Attention and Perception: Attention and perception normal.        Mood and Affect: Mood is anxious and depressed.        Speech: Speech normal.        Behavior: Behavior normal.        Thought Content: Thought content normal.  Cognition and Memory: Cognition and memory normal.        Judgment: Judgment normal.    Review of Systems  Psychiatric/Behavioral: Positive for depression. The patient is nervous/anxious and has insomnia.   All other systems reviewed and are negative.  Blood pressure 136/84, pulse (!) 103, temperature 98.3 F (36.8 C), temperature source Oral, resp. rate 18, SpO2 96 %. There is no height or weight on file to calculate BMI.  Treatment Plan Summary: Plan The patient remained under observation overnight and will be reassessed in the a.m. to determine if she meets the criteria for psychiatric inpatient admission; he could be discharged back home.  Disposition: Supportive therapy provided about ongoing stressors. Refer to IOP. Discussed crisis plan, support from social network, calling 911, coming  to the Emergency Department, and calling Suicide Hotline. The patient remained under observation overnight and will be reassessed in the a.m. to determine if she meets the criteria for psychiatric inpatient admission; he could be discharged back home.  Gillermo Murdoch, NP 11/22/2020 3:49 AM

## 2020-11-22 NOTE — ED Notes (Signed)
Pt asks this nurse if he can leave now. Pt educated on VOL status and that he is not under IVC but that he is intoxicated at this time and safety is important for pt. Pt states he feels as if "I can do what I need to do, I am of sound mind and body." Pt informed of being clinically intoxicated and that it was best if he stayed at this time to which he agrees. Pt continues to speak kindly with officer in quad

## 2020-11-22 NOTE — ED Provider Notes (Signed)
[facility] Emergency Department Provider Note   ____________________________________________   Event Date/Time   First MD Initiated Contact with Patient 11/22/20 1811     (approximate)  I have reviewed the triage vital signs and the nursing notes.   HISTORY  Chief Complaint Mental Health Problem    HPI RANVIR RENOVATO is a 40 y.o. male with past medical history of alcohol abuse, GERD, depression who presents to the ED complaining of alcohol withdrawal.  Patient reports that he is attempting to go through alcohol detox with his last drink coming last night.  He typically drinks a case of beer daily plus additional canned mixed drinks.  He has developed worsening symptoms of withdrawal throughout the day today, describes nausea, malaise, and tremors.  He was seen in the ED last night for some of the symptoms, but decided to go to [facility] earlier today to look into detox.  He states that they could not help him at [facility] and so he decided to come back to the ED.  He endorses depression and is requesting to speak with psychiatry, but currently denies any suicidal or homicidal ideation.        Past Medical History:  Diagnosis Date  . Anxiety   . Depression   . ETOH abuse   . GERD (gastroesophageal reflux disease)     Patient Active Problem List   Diagnosis Date Noted  . Closed left ankle fracture 09/16/2020  . Transaminitis 09/16/2020  . Hypokalemia 09/16/2020  . Subdural hematoma, post-traumatic (HCC) 09/16/2020  . Thrombocytopenia (HCC) 09/16/2020  . Alcohol abuse 08/01/2020  . Depression 08/01/2020  . Grieving 08/01/2020  . Gastric reflux 08/01/2020  . Vitamin D deficiency 08/01/2020    Past Surgical History:  Procedure Laterality Date  . ORIF ANKLE FRACTURE Left 09/27/2020   Procedure: OPEN REDUCTION INTERNAL FIXATION (ORIF) ANKLE FRACTURE, SYNDOSIS REPAIR;  Surgeon: Signa Kell, MD;  Location: [facility] ORS;  Service: Orthopedics;  Laterality: Left;   . TONSILLECTOMY    . WISDOM TOOTH EXTRACTION      Prior to Admission medications   Medication Sig Start Date End Date Taking? Authorizing Provider  acetaminophen (TYLENOL) 500 MG tablet Take 2 tablets (1,000 mg total) by mouth every 8 (eight) hours. 09/27/20 09/27/21  Signa Kell, MD  Cholecalciferol (VITAMIN D3) 50 MCG (2000 UT) CAPS Take by mouth daily.    [provider]  Multiple Vitamins-Minerals (MULTIVITAMIN MEN PO) Take by mouth daily.    [provider]  omeprazole (PRILOSEC) 20 MG capsule Take 20 mg by mouth daily.    [provider]  ondansetron (ZOFRAN ODT) 4 MG disintegrating tablet Take 1 tablet (4 mg total) by mouth every 8 (eight) hours as needed for nausea or vomiting. 09/27/20   Signa Kell, MD  oxycodone (OXY-IR) 5 MG capsule Take 5 mg by mouth every 4 (four) hours as needed.    [provider]  oxyCODONE (ROXICODONE) 5 MG immediate release tablet Take 1-2 tablets (5-10 mg total) by mouth every 4 (four) hours as needed (pain). 09/27/20 09/27/21  Signa Kell, MD    Allergies Patient has no known allergies.  Family History  Problem Relation Age of Onset  . Lung cancer Mother     Social History Social History   Tobacco Use  . Smoking status: Former Smoker    Packs/day: 0.50    Years: 18.00    Pack years: 9.00    Types: Cigarettes    Quit date: 03/25/2020  Years since quitting: 0.6  . Smokeless tobacco: Never Used  Vaping Use  . Vaping Use: Never used  Substance Use Topics  . Alcohol use: Yes    Alcohol/week: 28.0 standard drinks    Types: 28 Standard drinks or equivalent per week    Comment: 09/25/20 - pt reports no alcohol last 2 weeks  . Drug use: Not Currently    Types: Marijuana    Comment: early 2021 last used    Review of Systems  Constitutional: No fever/chills.  Positive for malaise. Eyes: No visual changes. ENT: No sore throat. Cardiovascular: Denies chest pain. Respiratory: Denies shortness of  breath. Gastrointestinal: No abdominal pain.  Positive for nausea, no vomiting.  No diarrhea.  No constipation. Genitourinary: Negative for dysuria. Musculoskeletal: Negative for back pain. Skin: Negative for rash. Neurological: Negative for headaches, focal weakness or numbness.  Positive for tremors.  ____________________________________________   PHYSICAL EXAM:  VITAL SIGNS: ED Triage Vitals  Enc Vitals Group     BP 11/22/20 1804 (!) 146/99     Pulse Rate 11/22/20 1804 100     Resp 11/22/20 1804 17     Temp 11/22/20 1804 99.4 F (37.4 C)     Temp Source 11/22/20 1804 Oral     SpO2 11/22/20 1804 97 %     Weight 11/22/20 1808 202 lb (91.6 kg)     Height 11/22/20 1808 5\' 8"  (1.727 m)     Head Circumference --      Peak Flow --      Pain Score 11/22/20 1808 5     Pain Loc --      Pain Edu? --      Excl. in GC? --     Constitutional: Alert and oriented. Eyes: Conjunctivae are normal. Head: Atraumatic. Nose: No congestion/rhinnorhea. Mouth/Throat: Mucous membranes are moist.  Tongue fasciculations noted. Neck: Normal ROM Cardiovascular: Normal rate, regular rhythm. Grossly normal heart sounds. Respiratory: Normal respiratory effort.  No retractions. Lungs CTAB. Gastrointestinal: Soft and nontender. No distention. Genitourinary: deferred Musculoskeletal: No lower extremity tenderness nor edema. Neurologic:  Normal speech and language.  Patient tremulous with no gross focal neurologic deficits appreciated. Skin:  Skin is warm, dry and intact. No rash noted. Psychiatric: Mood and affect are normal. Speech and behavior are normal.  ____________________________________________   LABS (all labs ordered are listed, but only abnormal results are displayed)  Labs Reviewed - No data to display  PROCEDURES  Procedure(s) performed (including Critical Care):  Procedures   ____________________________________________   INITIAL IMPRESSION / ASSESSMENT AND PLAN / ED  COURSE       40 year old male with possible history of alcohol abuse, GERD, and depression who presents to the ED for alcohol withdrawal and depression.  He is requesting to speak with psychiatry about his depression but denies any SI or HI and we will hold off on IVC.  He does appear to be developing alcohol withdrawal, although symptoms are relatively mild at this time.  We will start patient on CIWA protocol and treat with Ativan.  He had labs drawn last night that were unremarkable, no need to repeat at this time.  TTS will be consulted for potential detox.  The patient has been placed in psychiatric observation due to the need to provide a safe environment for the patient while obtaining psychiatric consultation and evaluation, as well as ongoing medical and medication management to treat the patient's condition.  The patient has not been placed under full IVC at this time.  ____________________________________________   FINAL CLINICAL IMPRESSION(S) / ED DIAGNOSES  Final diagnoses:  Alcohol abuse  Depression, unspecified depression type     ED Discharge Orders    None       Note:  This document was prepared using Dragon voice recognition software and may include unintentional dictation errors.   Chesley Noon, MD 11/22/20 [phone]

## 2020-11-22 NOTE — ED Notes (Signed)
VOL/pending reassesment

## 2020-11-22 NOTE — Consult Note (Signed)
[facility] Face-to-Face Psychiatry Consult   Reason for Consult: Depression and Anxiety Referring Physician: Dr. Larinda Buttery Patient Identification: Chad Walsh MRN:  [id number] Principal Diagnosis: <principal problem not specified> Diagnosis:  Active Problems:   Alcohol abuse   Depression   Grieving  Total Time spent with patient: 20 minutes  Subjective: " I did not drink today. I am here for detox." Chad Walsh is a 40 y.o. male patient presented to [facility] ED via [facility] from home, where he went to [facility] due to increased grief, depression and anxiety. The patient states he is here to [facility], 11/23/20, at 9:30 am (RTS). He states they have a bed for him. The patient keeps coming back to the ER after being discharged.  This provider saw the patient face-to-face; the chart was reviewed and consulted with Dr.Jessup on 11/22/2020 due to the patient's care. During the patient assessment, he is calm, experiencing some tremors and voicing how the loss of his mother is difficult. The patient disclosed he does not have much of a support system. It was discussed with the EDP that the patient remained under observation overnight and will be discharged in the a.m. to [facility] tomorrow, 11/23/20, at 9:30 am (RTS) for detox treatment for his alcohol abuse.  On evaluation, the patient is alert and oriented x 4, emotional but cooperative, and mood-congruent with affect. The patient does not appear to be responding to internal or external stimuli. Neither is the patient presenting with any delusional thinking. The patient denies auditory or visual hallucinations. The patient denies any suicidal, homicidal, or self-harm ideations. The patient is not presenting with any psychotic or paranoid behaviors. During an encounter with the patient, he was able to answer questions appropriately.  Disposition: That the patient remained under observation overnight and will  be discharged in the a.m. to [facility] tomorrow, 11/23/20, at 9:30 am (RTS) for detox treatment for his alcohol abuse.   HPI: Per Dr. Harlan Stains is a 40 y.o. male with past medical history of alcohol abuse, GERD, depression who presents to the ED complaining of alcohol withdrawal.  Patient reports that he is attempting to go through alcohol detox with his last drink coming last night.  He typically drinks a case of beer daily plus additional canned mixed drinks.  He has developed worsening symptoms of withdrawal throughout the day today, describes nausea, malaise, and tremors.  He was seen in the ED last night for some of the symptoms, but decided to go to [facility] earlier today to look into detox.  He states that they could not help him at [facility] and so he decided to come back to the ED.  He endorses depression and is requesting to speak with psychiatry, but currently denies any suicidal or homicidal ideation.  Past Psychiatric History: Anxiety Depression Alcohol abuse  Risk to Self:  No Risk to Others:  No Prior Inpatient Therapy:  No Prior Outpatient Therapy:  No  Past Medical History:  Past Medical History:  Diagnosis Date  . Anxiety   . Depression   . ETOH abuse   . GERD (gastroesophageal reflux disease)     Past Surgical History:  Procedure Laterality Date  . ORIF ANKLE FRACTURE Left 09/27/2020   Procedure: OPEN REDUCTION INTERNAL FIXATION (ORIF) ANKLE FRACTURE, SYNDOSIS REPAIR;  Surgeon: Signa Kell, MD;  Location: [facility] ORS;  Service: Orthopedics;  Laterality: Left;  . TONSILLECTOMY    . WISDOM TOOTH EXTRACTION  Family History:  Family History  Problem Relation Age of Onset  . Lung cancer Mother    Family Psychiatric  History: Unknown Social History:  Social History   Substance and Sexual Activity  Alcohol Use Yes  . Alcohol/week: 28.0 standard drinks  . Types: 28 Standard drinks or equivalent per week   Comment: 09/25/20 - pt reports no  alcohol last 2 weeks     Social History   Substance and Sexual Activity  Drug Use Not Currently  . Types: Marijuana   Comment: early 2021 last used    Social History   Socioeconomic History  . Marital status: Single    Spouse name: Not on file  . Number of children: Not on file  . Years of education: Not on file  . Highest education level: Not on file  Occupational History  . Not on file  Tobacco Use  . Smoking status: Former Smoker    Packs/day: 0.50    Years: 18.00    Pack years: 9.00    Types: Cigarettes    Quit date: 03/25/2020    Years since quitting: 0.6  . Smokeless tobacco: Never Used  Vaping Use  . Vaping Use: Never used  Substance and Sexual Activity  . Alcohol use: Yes    Alcohol/week: 28.0 standard drinks    Types: 28 Standard drinks or equivalent per week    Comment: 09/25/20 - pt reports no alcohol last 2 weeks  . Drug use: Not Currently    Types: Marijuana    Comment: early 2021 last used  . Sexual activity: Not on file  Other Topics Concern  . Not on file  Social History Narrative  . Not on file   Social Determinants of Health   Financial Resource Strain: Not on file  Food Insecurity: Not on file  Transportation Needs: Not on file  Physical Activity: Not on file  Stress: Not on file  Social Connections: Not on file   Additional Social History:    Allergies:  No Known Allergies  Labs:  Results for orders placed or performed during the hospital encounter of 11/21/20 (from the past 48 hour(s))  CBC     Status: Abnormal   Collection Time: 11/22/20 12:13 AM  Result Value Ref Range   WBC 9.2 4.0 - 10.5 K/uL   RBC 3.94 (L) 4.22 - 5.81 MIL/uL   Hemoglobin 12.8 (L) 13.0 - 17.0 g/dL   HCT 79.4 (L) 80.1 - 65.5 %   MCV 93.4 80.0 - 100.0 fL   MCH 32.5 26.0 - 34.0 pg   MCHC 34.8 30.0 - 36.0 g/dL   RDW 37.4 82.7 - 07.8 %   Platelets 269 150 - 400 K/uL   nRBC 0.0 0.0 - 0.2 %    Comment: Performed at [facility], [address]  [place], [place] [address]  Comprehensive metabolic panel     Status: Abnormal   Collection Time: 11/22/20 12:13 AM  Result Value Ref Range   Sodium 144 135 - 145 mmol/L   Potassium 3.7 3.5 - 5.1 mmol/L   Chloride 104 98 - 111 mmol/L   CO2 25 22 - 32 mmol/L   Glucose, Bld 129 (H) 70 - 99 mg/dL    Comment: Glucose reference range applies only to samples taken after fasting for at least 8 hours.   BUN <5 (L) 6 - 20 mg/dL   Creatinine, Ser 9.20 0.61 - 1.24 mg/dL   Calcium 8.8 (L) 8.9 - 10.3 mg/dL  Total Protein 8.0 6.5 - 8.1 g/dL   Albumin 4.8 3.5 - 5.0 g/dL   AST 68 (H) 15 - 41 U/L   ALT 35 0 - 44 U/L   Alkaline Phosphatase 102 38 - 126 U/L   Total Bilirubin 1.1 0.3 - 1.2 mg/dL   GFR, Estimated >44 >03 mL/min    Comment: (NOTE) Calculated using the CKD-EPI Creatinine Equation (2021)    Anion gap 15 5 - 15    Comment: Performed at [facility], [address] [place], [place] [address]  Ethanol     Status: Abnormal   Collection Time: 11/22/20 12:13 AM  Result Value Ref Range   Alcohol, Ethyl (B) 301 (HH) <10 mg/dL    Comment: CRITICAL RESULT CALLED TO, READ BACK BY AND VERIFIED WITH CHRIS BUCKNER @0047  ON 11/22/20 SKL (NOTE) Lowest detectable limit for serum alcohol is 10 mg/dL.  For medical purposes only. Performed at [facility], [address] [place], [place]   Urine Drug Screen, Qualitative [facility] only)     Status: None   Collection Time: 11/22/20  1:09 AM  Result Value Ref Range   Tricyclic, Ur Screen NONE DETECTED NONE DETECTED   Amphetamines, Ur Screen NONE DETECTED NONE DETECTED   MDMA (Ecstasy)Ur Screen NONE DETECTED NONE DETECTED   Cocaine Metabolite,Ur Anza NONE DETECTED NONE DETECTED   Opiate, Ur Screen NONE DETECTED NONE DETECTED   Phencyclidine (PCP) Ur S NONE DETECTED NONE DETECTED   Cannabinoid 50 Ng, Ur [place] NONE DETECTED NONE DETECTED   Barbiturates, Ur Screen NONE DETECTED NONE DETECTED   Benzodiazepine, Ur Scrn NONE DETECTED  NONE DETECTED   Methadone Scn, Ur NONE DETECTED NONE DETECTED    Comment: (NOTE) Tricyclics + metabolites, urine    Cutoff 1000 ng/mL Amphetamines + metabolites, urine  Cutoff 1000 ng/mL MDMA (Ecstasy), urine              Cutoff 500 ng/mL Cocaine Metabolite, urine          Cutoff 300 ng/mL Opiate + metabolites, urine        Cutoff 300 ng/mL Phencyclidine (PCP), urine         Cutoff 25 ng/mL Cannabinoid, urine                 Cutoff 50 ng/mL Barbiturates + metabolites, urine  Cutoff 200 ng/mL Benzodiazepine, urine              Cutoff 200 ng/mL Methadone, urine                   Cutoff 300 ng/mL  The urine drug screen provides only a preliminary, unconfirmed analytical test result and should not be used for non-medical purposes. Clinical consideration and professional judgment should be applied to any positive drug screen result due to possible interfering substances. A more specific alternate chemical method must be used in order to obtain a confirmed analytical result. Gas chromatography / mass spectrometry (GC/MS) is the preferred confirm atory method. Performed at [facility], [address] [place], [place]   Resp Panel by RT-PCR (Flu A&B, Covid) Nasopharyngeal Swab     Status: None   Collection Time: 11/22/20  5:20 AM   Specimen: Nasopharyngeal Swab; Nasopharyngeal(NP) swabs in vial transport medium  Result Value Ref Range   SARS Coronavirus 2 by RT PCR NEGATIVE NEGATIVE    Comment: (NOTE) SARS-CoV-2 target nucleic acids are NOT DETECTED.  The SARS-CoV-2 RNA is generally detectable in upper respiratory  specimens during the acute phase of infection. The lowest concentration of SARS-CoV-2 viral copies this assay can detect is 138 copies/mL. A negative result does not preclude SARS-Cov-2 infection and should not be used as the sole basis for treatment or other patient management decisions. A negative result may occur with  improper specimen  collection/handling, submission of specimen other than nasopharyngeal swab, presence of viral mutation(s) within the areas targeted by this assay, and inadequate number of viral copies(<138 copies/mL). A negative result must be combined with clinical observations, patient history, and epidemiological information. The expected result is Negative.  Fact Sheet for Patients:  BloggerCourse.com  Fact Sheet for Healthcare Providers:  SeriousBroker.it  This test is no t yet approved or cleared by the [place] FDA and  has been authorized for detection and/or diagnosis of SARS-CoV-2 by FDA under an Emergency Use Authorization (EUA). This EUA will remain  in effect (meaning this test can be used) for the duration of the COVID-19 declaration under Section 564(b)(1) of the Act, 21 U.S.C.section 360bbb-3(b)(1), unless the authorization is terminated  or revoked sooner.       Influenza A by PCR NEGATIVE NEGATIVE   Influenza B by PCR NEGATIVE NEGATIVE    Comment: (NOTE) The Xpert Xpress SARS-CoV-2/FLU/RSV plus assay is intended as an aid in the diagnosis of influenza from Nasopharyngeal swab specimens and should not be used as a sole basis for treatment. Nasal washings and aspirates are unacceptable for Xpert Xpress SARS-CoV-2/FLU/RSV testing.  Fact Sheet for Patients: BloggerCourse.com  Fact Sheet for Healthcare Providers: SeriousBroker.it  This test is not yet approved or cleared by the [place] FDA and has been authorized for detection and/or diagnosis of SARS-CoV-2 by FDA under an Emergency Use Authorization (EUA). This EUA will remain in effect (meaning this test can be used) for the duration of the COVID-19 declaration under Section 564(b)(1) of the Act, 21 U.S.C. section 360bbb-3(b)(1), unless the authorization is terminated or revoked.  Performed at [facility], [address] [place], [place] [address]     Current Facility-Administered Medications  Medication Dose Route Frequency Provider Last Rate Last Admin  . LORazepam (ATIVAN) injection 0-4 mg  0-4 mg Intravenous Q6H Chesley Noon, MD       Or  . LORazepam (ATIVAN) tablet 0-4 mg  0-4 mg Oral Q6H Chesley Noon, MD   2 mg at 11/22/20 1920  . [START ON 11/25/2020] LORazepam (ATIVAN) injection 0-4 mg  0-4 mg Intravenous Q12H Chesley Noon, MD       Or  . Melene Muller ON 11/25/2020] LORazepam (ATIVAN) tablet 0-4 mg  0-4 mg Oral Q12H Chesley Noon, MD      . thiamine tablet 100 mg  100 mg Oral Daily Chesley Noon, MD   100 mg at 11/22/20 1922   Or  . thiamine (B-1) injection 100 mg  100 mg Intravenous Daily Chesley Noon, MD       Current Outpatient Medications  Medication Sig Dispense Refill  . acetaminophen (TYLENOL) 500 MG tablet Take 2 tablets (1,000 mg total) by mouth every 8 (eight) hours. 90 tablet 2  . Cholecalciferol (VITAMIN D3) 50 MCG (2000 UT) CAPS Take by mouth daily.    . Multiple Vitamins-Minerals (MULTIVITAMIN MEN PO) Take by mouth daily.    Marland Kitchen omeprazole (PRILOSEC) 20 MG capsule Take 20 mg by mouth daily.    . ondansetron (ZOFRAN ODT) 4 MG disintegrating tablet Take 1 tablet (4 mg total) by mouth every 8 (eight) hours as needed for  nausea or vomiting. 20 tablet 0  . oxycodone (OXY-IR) 5 MG capsule Take 5 mg by mouth every 4 (four) hours as needed.    Marland Kitchen. oxyCODONE (ROXICODONE) 5 MG immediate release tablet Take 1-2 tablets (5-10 mg total) by mouth every 4 (four) hours as needed (pain). 30 tablet 0    Musculoskeletal: Strength & Muscle Tone: within normal limits Gait & Station: normal Patient leans: N/A  Psychiatric Specialty Exam:  Presentation  General Appearance: Appropriate for Environment; Fairly Groomed  Eye Contact:Good  Speech:Clear and Coherent  Speech Volume:Normal  Handedness:Right   Mood and Affect  Mood:Depressed; Hopeless  Affect:Blunt;  Congruent; Depressed; Flat; Tearful   Thought Process  Thought Processes:Coherent  Descriptions of Associations:Intact  Orientation:Full (Time, Place and Person)  Thought Content:Logical  History of Schizophrenia/Schizoaffective disorder:No  Duration of Psychotic Symptoms:No data recorded Hallucinations:No data recorded  Ideas of Reference:None  Suicidal Thoughts:No data recorded  Homicidal Thoughts:No data recorded   Sensorium  Memory:Immediate Good; Recent Good; Remote Good  Judgment:Good  Insight:Fair   Executive Functions  Concentration:Good  Attention Span:Good  Recall:Good  Fund of Knowledge:Good  Language:Good   Psychomotor Activity  Psychomotor Activity:No data recorded   Assets  Assets:Desire for Improvement; Intimacy; Resilience; Social Support   Sleep  Sleep:No data recorded   Physical Exam: Physical Exam Vitals and nursing note reviewed.  Constitutional:      Appearance: Normal appearance. He is obese.  HENT:     Right Ear: External ear normal.     Left Ear: External ear normal.     Nose: Nose normal.  Eyes:     Conjunctiva/sclera: Conjunctivae normal.  Cardiovascular:     Rate and Rhythm: Tachycardia present.  Pulmonary:     Effort: Pulmonary effort is normal.  Musculoskeletal:        General: Normal range of motion.     Cervical back: Normal range of motion and neck supple.  Neurological:     Mental Status: He is alert and oriented to person, place, and time. Mental status is at baseline.  Psychiatric:        Attention and Perception: Attention and perception normal.        Mood and Affect: Mood is anxious and depressed.        Speech: Speech normal.        Behavior: Behavior normal.        Thought Content: Thought content normal.        Cognition and Memory: Cognition and memory normal.        Judgment: Judgment normal.    Review of Systems  Psychiatric/Behavioral: Positive for depression. The patient is  nervous/anxious and has insomnia.   All other systems reviewed and are negative.  Blood pressure (!) 144/89, pulse 96, temperature 98.3 F (36.8 C), temperature source Oral, resp. rate 18, height 5\' 8"  (1.727 m), weight 91.6 kg, SpO2 100 %. Body mass index is 30.71 kg/m.  Treatment Plan Summary: Plan That the patient remained under observation overnight and will be discharged in the a.m. to [facility] tomorrow, 11/23/20, at 9:30 am (RTS) for detox treatment for his alcohol abuse.   Disposition: No evidence of imminent risk to self or others at present.   Patient does not meet criteria for psychiatric inpatient admission. Supportive therapy provided about ongoing stressors. Refer to IOP. Discussed crisis plan, support from social network, calling 911, coming to the Emergency Department, and calling Suicide Hotline. That the patient remained under observation overnight and will be discharged  in the a.m. to [facility] tomorrow, 11/23/20, at 9:30 am (RTS) for detox treatment for his alcohol abuse.   Gillermo Murdoch, NP 11/22/2020 9:15 PM

## 2020-11-22 NOTE — Discharge Instructions (Addendum)
Please seek medical attention for any high fevers, chest pain, shortness of breath, change in behavior, persistent vomiting, bloody stool or any other new or concerning symptoms.  

## 2020-11-22 NOTE — ED Notes (Signed)
Pt given tray and juice at this time.

## 2020-11-22 NOTE — ED Triage Notes (Signed)
Pt was here yesterday after heavy drinking. Pt was discharged early this morning. Pt sought help at [facility] and they could not see him. Pt was told to come here if he felt bad. Pt states he feels like he would if he had the flu, which started after he was discharged. Pt states he cannot sleep or eat. Pt states he has not eaten since last week that he can remember and has only had alcohol. Pt states he has not had any alcohol since yesterday.

## 2020-11-22 NOTE — ED Notes (Signed)
Pt stepped out of room to request additional pillow and blankets. Pt given pillow and blankets in room by this RN. Pt denies further needs at this time.

## 2020-11-22 NOTE — BH Assessment (Addendum)
This Clinical research associate and Psyc NP visited with patient to complete assessment, patient reports that [facility] at 9:30am  Writer contacted [facility] and spoke with Susie to confirm that patient does indeed have a bed available tomorrow for substance abuse/detox treatment  Communicated this to: ED RN Penni Bombard ED Secretary [facility] EDP Dr. Larinda Buttery  Address: [address], [place] [address]

## 2020-11-22 NOTE — ED Notes (Signed)
Cup of water given to patient. 

## 2020-11-22 NOTE — ED Notes (Signed)
No interventions at this time per Dr. Don Perking.

## 2020-11-22 NOTE — ED Notes (Signed)
Critical lab value called to this nurse  Ethanol level at 301, Dr. Don Perking to be notified at this time.

## 2020-11-22 NOTE — BH Assessment (Signed)
Comprehensive Clinical Assessment (CCA) Note  11/22/2020 Chad Walsh [id number]  Chief Complaint: Patient is a 40 year old male presenting to [facility] ED voluntarily for detox treatment, this is patient's 3rd visit to this ED. When psyc team arrives to assess patient, patient reports that he already has a bed set up with [facility] for tomorrow 11/23/20 at 9:30am. Patient denies drinking any alcohol today but does report drinking yesterday. Patient denies SI/HI/AH/VH  Patient to discharge in the morning and transfer to [facility] for detox treatment Chief Complaint  Patient presents with  . Mental Health Problem   Visit Diagnosis: Alcohol Use Disorder, severe   CCA Screening, Triage and Referral (STR)  Patient Reported Information How did you hear about [place]? Self  Referral name: No data recorded Referral phone number: No data recorded  Whom do you see for routine medical problems? Other (Comment)  Practice/Facility Name: No data recorded Practice/Facility Phone Number: No data recorded Name of Contact: No data recorded Contact Number: No data recorded Contact Fax Number: No data recorded Prescriber Name: No data recorded Prescriber Address (if known): No data recorded  What Is the Reason for Your Visit/Call Today? No data recorded How Long Has This Been Causing You Problems? > than 6 months  What Do You Feel Would Help You the Most Today? Alcohol or Drug Use Treatment; Treatment for Depression or other mood problem   Have You Recently Been in Any Inpatient Treatment (Hospital/Detox/Crisis Center/28-Day Program)? No  Name/Location of Program/Hospital:No data recorded How Long Were You There? No data recorded When Were You Discharged? No data recorded  Have You Ever Received Services From [facility] Before? No  Who Do You See at [facility]? No data recorded  Have You Recently Had Any Thoughts About Hurting Yourself? No  Are You Planning to Commit Suicide/Harm Yourself At This  time? No   Have you Recently Had Thoughts About Hurting Someone Karolee Ohs? No  Explanation: No data recorded  Have You Used Any Alcohol or Drugs in the Past 24 Hours? Yes  How Long Ago Did You Use Drugs or Alcohol? No data recorded What Did You Use and How Much? Alcohol   Do You Currently Have a Therapist/Psychiatrist? No  Name of Therapist/Psychiatrist: No data recorded  Have You Been Recently Discharged From Any Office Practice or Programs? No  Explanation of Discharge From Practice/Program: No data recorded    CCA Screening Triage Referral Assessment Type of Contact: Face-to-Face  Is this Initial or Reassessment? No data recorded Date Telepsych consult ordered in [facility]:  08/01/2020  Time Telepsych consult ordered in [facility]:  0407   Patient Reported Information Reviewed? Yes  Patient Left Without Being Seen? No data recorded Reason for Not Completing Assessment: No data recorded  Collateral Involvement: No data recorded  Does Patient Have a Court Appointed Legal Guardian? No data recorded Name and Contact of Legal Guardian: Self  If Minor and Not Living with Parent(s), Who has Custody? No data recorded Is CPS involved or ever been involved? Never  Is APS involved or ever been involved? Never   Patient Determined To Be At Risk for Harm To Self or Others Based on Review of Patient Reported Information or Presenting Complaint? No  Method: No data recorded Availability of Means: No data recorded Intent: No data recorded Notification Required: No data recorded Additional Information for Danger to Others Potential: No data recorded Additional Comments for Danger to Others Potential: No data recorded Are There Guns or Other Weapons in Your  Home? No data recorded Types of Guns/Weapons: No data recorded Are These Weapons Safely Secured?                            No data recorded Who Could Verify You Are Able To Have These Secured: No data recorded Do You Have any Outstanding  Charges, Pending Court Dates, Parole/Probation? No data recorded Contacted To Inform of Risk of Harm To Self or Others: No data recorded  Location of Assessment: [facility] ED   Does Patient Present under Involuntary Commitment? No  IVC Papers Initial File Date: No data recorded  [place]   Patient Currently Receiving the Following Services: No data recorded  Determination of Need: Emergent (2 hours)   Options For Referral: No data recorded    CCA Biopsychosocial Intake/Chief Complaint:  Patient is presenting under the influence of alcohol with depression  Current Symptoms/Problems: Patient is presenting under the influence of alcohol with depression, patient now requesting detox   Patient Reported Schizophrenia/Schizoaffective Diagnosis in Past: No   Strengths: Patient is able to communicate  Preferences: Unknown  Abilities: Patient is able to communicate   Type of Services Patient Feels are Needed: Unknown   Initial Clinical Notes/Concerns: None   Mental Health Symptoms Depression:  Change in energy/activity; Hopelessness; Sleep (too much or little)   Duration of Depressive symptoms: Greater than two weeks   Mania:  None   Anxiety:   Difficulty concentrating; Restlessness   Psychosis:  None   Duration of Psychotic symptoms: No data recorded  Trauma:  None   Obsessions:  None   Compulsions:  None   Inattention:  None   Hyperactivity/Impulsivity:  N/A   Oppositional/Defiant Behaviors:  None   Emotional Irregularity:  None   Other Mood/Personality Symptoms:  No data recorded   Mental Status Exam Appearance and self-care  Stature:  Average   Weight:  Average weight   Clothing:  Disheveled   Grooming:  Neglected   Cosmetic use:  None   Posture/gait:  Normal   Motor activity:  Not Remarkable   Sensorium  Attention:  Normal   Concentration:  Normal   Orientation:  X5   Recall/memory:  Normal   Affect and Mood   Affect:  Depressed   Mood:  Depressed   Relating  Eye contact:  Normal   Facial expression:  Responsive   Attitude toward examiner:  Cooperative   Thought and Language  Speech flow: Clear and Coherent   Thought content:  Appropriate to Mood and Circumstances   Preoccupation:  Obsessions   Hallucinations:  None   Organization:  No data recorded  [employer] ServicesExecutive Functions  Fund of Knowledge:  Fair   Intelligence:  Average   Abstraction:  Normal   Judgement:  Fair   Dance movement psychotherapisteality Testing:  Realistic   Insight:  Fair   Decision Making:  Normal   Social Functioning  Social Maturity:  Isolates   Social Judgement:  Normal   Stress  Stressors:  Grief/losses; Financial   Coping Ability:  Exhausted   Skill Deficits:  None   Supports:  Support needed     Religion: Religion/Spirituality Are You A Religious Person?: No  Leisure/Recreation: Leisure / Recreation Do You Have Hobbies?: No  Exercise/Diet: Exercise/Diet Do You Exercise?: No Have You Gained or Lost A Significant Amount of Weight in the Past Six Months?: No Do You Follow a Special Diet?: No Do You Have Any Trouble Sleeping?: No  CCA Employment/Education Employment/Work Situation: Employment / Work Situation Employment situation: Unemployed Has patient ever been in the [employer]?: No  Education: Education Did Designer, television/film set?: No Did You Have An Individualized Education Program (IIEP): No Did You Have Any Difficulty At [employer]?: No   CCA Family/Childhood History Family and Relationship History: Family history Marital status: Single Are you sexually active?:  (Unknown) What is your sexual orientation?: Unknown Does patient have children?: No  Childhood History:  Childhood History Additional childhood history information: None reported Description of patient's relationship with caregiver when they were a child: None reported Patient's description of current relationship with people  who raised him/her: None reported How were you disciplined when you got in trouble as a child/adolescent?: None reported Does patient have siblings?:  (UTA) Did patient suffer any verbal/emotional/physical/sexual abuse as a child?: No Did patient suffer from severe childhood neglect?: No Has patient ever been sexually abused/assaulted/raped as an adolescent or adult?: No Was the patient ever a victim of a crime or a disaster?: No Witnessed domestic violence?: No Has patient been affected by domestic violence as an adult?: No  Child/Adolescent Assessment:     CCA Substance Use Alcohol/Drug Use: Alcohol / Drug Use Pain Medications: See MAR Prescriptions: See MAR Over the Counter: See MAR History of alcohol / drug use?: Yes Withdrawal Symptoms: Sweats,Fever / Chills Substance #1 Name of Substance 1: Alcohol 1 - Amount (size/oz): Unable to quantify 1 - Frequency: daily 1 - Last Use / Amount: Unable to quantify, last used 11/21/20                       ASAM's:  Six Dimensions of Multidimensional Assessment  Dimension 1:  Acute Intoxication and/or Withdrawal Potential:      Dimension 2:  Biomedical Conditions and Complications:      Dimension 3:  Emotional, Behavioral, or Cognitive Conditions and Complications:     Dimension 4:  Readiness to Change:     Dimension 5:  Relapse, Continued use, or Continued Problem Potential:     Dimension 6:  Recovery/Living Environment:     ASAM Severity Score:    ASAM Recommended Level of Treatment:     Substance use Disorder (SUD) Substance Use Disorder (SUD)  Checklist Symptoms of Substance Use: Continued use despite having a persistent/recurrent physical/psychological problem caused/exacerbated by use,Evidence of tolerance,Evidence of withdrawal (Comment),Large amounts of time spent to obtain, use or recover from the substance(s),Continued use despite persistent or recurrent social, interpersonal problems, caused or exacerbated by  use,Persistent desire or unsuccessful efforts to cut down or control use,Repeated use in physically hazardous situations,Recurrent use that results in a failure to fulfill major role obligations (work, school, home),Presence of craving or strong urge to use,Social, occupational, recreational activities given up or reduced due to use,Substance(s) often taken in larger amounts or over longer times than was intended  Recommendations for Services/Supports/Treatments: Recommendations for Services/Supports/Treatments Recommendations For Services/Supports/Treatments: Detox  DSM5 Diagnoses: Patient Active Problem List   Diagnosis Date Noted  . Closed left ankle fracture 09/16/2020  . Transaminitis 09/16/2020  . Hypokalemia 09/16/2020  . Subdural hematoma, post-traumatic (HCC) 09/16/2020  . Thrombocytopenia (HCC) 09/16/2020  . Alcohol abuse 08/01/2020  . Depression 08/01/2020  . Grieving 08/01/2020  . Gastric reflux 08/01/2020  . Vitamin D deficiency 08/01/2020    Patient Centered Plan: Patient is on the following Treatment Plan(s):  Substance Abuse   Referrals to Alternative Service(s): Referred to Alternative Service(s):   Place:   Date:  Time:    Referred to Alternative Service(s):   Place:   Date:   Time:    Referred to Alternative Service(s):   Place:   Date:   Time:    Referred to Alternative Service(s):   Place:   Date:   Time:     Raneem Mendolia A Malori Myers, LCAS-A

## 2020-11-22 NOTE — ED Notes (Signed)
Hourly rounding completed at this time, patient currently awake in hallwa bed. No complaints, stable, and in no acute distress. Q15 minute rounds and monitoring via Psychologist, counselling to continue.

## 2020-11-22 NOTE — ED Provider Notes (Signed)
[facility] Emergency Department Provider Note  ____________________________________________  Time seen: Approximately 12:16 AM  I have reviewed the triage vital signs and the nursing notes.   HISTORY  Chief Complaint Alcohol Intoxication and Depression   HPI Chad Walsh is a 40 y.o. male with history of alcohol abuse, anxiety, depression who presents for evaluation of depression alcohol intoxication.  Patient call [facility] this evening for help with.  Police was contacted by [employer] for a Public house manager.  When they got to the house patient was extremely intoxicated and was brought here for evaluation.  Patient reports that he lost his mother in November and has been grieving.  He has been extremely depressed but denies any suicidal homicidal thoughts.  Patient does endorse drinking several beers a day and has been doing that for at least 3 years.  Denies any history of complicated withdrawals.  Denies any other substance use.  Denies any medical complaints.   Patient requesting help with detox  Past Medical History:  Diagnosis Date  . Anxiety   . Depression   . ETOH abuse   . GERD (gastroesophageal reflux disease)     Patient Active Problem List   Diagnosis Date Noted  . Closed left ankle fracture 09/16/2020  . Transaminitis 09/16/2020  . Hypokalemia 09/16/2020  . Subdural hematoma, post-traumatic (HCC) 09/16/2020  . Thrombocytopenia (HCC) 09/16/2020  . Alcohol abuse 08/01/2020  . Depression 08/01/2020  . Grieving 08/01/2020  . Gastric reflux 08/01/2020  . Vitamin D deficiency 08/01/2020    Past Surgical History:  Procedure Laterality Date  . ORIF ANKLE FRACTURE Left 09/27/2020   Procedure: OPEN REDUCTION INTERNAL FIXATION (ORIF) ANKLE FRACTURE, SYNDOSIS REPAIR;  Surgeon: Signa Kell, MD;  Location: [facility] ORS;  Service: Orthopedics;  Laterality: Left;  . TONSILLECTOMY    . WISDOM TOOTH EXTRACTION      Prior to Admission medications   Medication  Sig Start Date End Date Taking? Authorizing Provider  acetaminophen (TYLENOL) 500 MG tablet Take 2 tablets (1,000 mg total) by mouth every 8 (eight) hours. 09/27/20 09/27/21  Signa Kell, MD  Cholecalciferol (VITAMIN D3) 50 MCG (2000 UT) CAPS Take by mouth daily.    [provider]  Multiple Vitamins-Minerals (MULTIVITAMIN MEN PO) Take by mouth daily.    [provider]  omeprazole (PRILOSEC) 20 MG capsule Take 20 mg by mouth daily.    [provider]  ondansetron (ZOFRAN ODT) 4 MG disintegrating tablet Take 1 tablet (4 mg total) by mouth every 8 (eight) hours as needed for nausea or vomiting. 09/27/20   Signa Kell, MD  oxycodone (OXY-IR) 5 MG capsule Take 5 mg by mouth every 4 (four) hours as needed.    [provider]  oxyCODONE (ROXICODONE) 5 MG immediate release tablet Take 1-2 tablets (5-10 mg total) by mouth every 4 (four) hours as needed (pain). 09/27/20 09/27/21  Signa Kell, MD    Allergies Patient has no known allergies.  Family History  Problem Relation Age of Onset  . Lung cancer Mother     Social History Social History   Tobacco Use  . Smoking status: Former Smoker    Packs/day: 0.50    Years: 18.00    Pack years: 9.00    Types: Cigarettes    Quit date: 03/25/2020    Years since quitting: 0.6  . Smokeless tobacco: Never Used  Vaping Use  . Vaping Use: Never used  Substance Use Topics  . Alcohol use: Yes    Alcohol/week:  28.0 standard drinks    Types: 28 Standard drinks or equivalent per week    Comment: 09/25/20 - pt reports no alcohol last 2 weeks  . Drug use: Not Currently    Types: Marijuana    Comment: early 2021 last used    Review of Systems  Constitutional: Negative for fever. Eyes: Negative for visual changes. ENT: Negative for sore throat. Neck: No neck pain  Cardiovascular: Negative for chest pain. Respiratory: Negative for shortness of breath. Gastrointestinal: Negative for abdominal pain, vomiting or  diarrhea. Genitourinary: Negative for dysuria. Musculoskeletal: Negative for back pain. Skin: Negative for rash. Neurological: Negative for headaches, weakness or numbness. Psych: No SI or HI. + depression  ____________________________________________   PHYSICAL EXAM:  VITAL SIGNS: ED Triage Vitals  Enc Vitals Group     BP 11/21/20 2334 136/84     Pulse Rate 11/21/20 2334 (!) 103     Resp 11/21/20 2334 18     Temp 11/21/20 2334 98.3 F (36.8 C)     Temp Source 11/21/20 2334 Oral     SpO2 11/21/20 2334 96 %     Weight --      Height --      Head Circumference --      Peak Flow --      Pain Score 11/21/20 2335 5     Pain Loc --      Pain Edu? --      Excl. in GC? --     Constitutional: Alert and oriented, clinically intoxicated.  HEENT:      Head: Normocephalic and atraumatic.         Eyes: Conjunctivae are normal. Sclera is non-icteric.       Mouth/Throat: Mucous membranes are moist.       Neck: Supple with no signs of meningismus. Cardiovascular: Regular rate and rhythm.  Respiratory: Normal respiratory effort.  Gastrointestinal: Soft, non tender, and non distended. Musculoskeletal: No edema, cyanosis, or erythema of extremities. Neurologic: Slurred speech. Face is symmetric. Moving all extremities. No gross focal neurologic deficits are appreciated. Skin: Skin is warm, dry and intact. No rash noted. Psychiatric: Mood and affect are normal. Speech and behavior are normal.  ____________________________________________   LABS (all labs ordered are listed, but only abnormal results are displayed)  Labs Reviewed  CBC - Abnormal; Notable for the following components:      Result Value   RBC 3.94 (*)    Hemoglobin 12.8 (*)    HCT 36.8 (*)    All other components within normal limits  COMPREHENSIVE METABOLIC PANEL - Abnormal; Notable for the following components:   Glucose, Bld 129 (*)    BUN <5 (*)    Calcium 8.8 (*)    AST 68 (*)    All other components  within normal limits  ETHANOL - Abnormal; Notable for the following components:   Alcohol, Ethyl (B) 301 (*)    All other components within normal limits  RESP PANEL BY RT-PCR (FLU A&B, COVID) ARPGX2  URINE DRUG SCREEN, QUALITATIVE ([facility] ONLY)   ____________________________________________  EKG  none  ____________________________________________  RADIOLOGY  none  ____________________________________________   PROCEDURES  Procedure(s) performed: None Procedures Critical Care performed:  None ____________________________________________   INITIAL IMPRESSION / ASSESSMENT AND PLAN / ED COURSE   40 y.o. male with history of alcohol abuse, anxiety, depression who presents for evaluation of depression and alcohol intoxication.  Patient is extremely intoxicated, slurring his speech.  Denies any other drug use.  Extremely  depressed but denies SI.  No indication for IVC.  Patient is requesting help with detox and with his depression which has been ongoing since November when he lost his mother.  Patient denies any history of complicated withdrawals.  CIWA score of 0 on arrival.  Will start patient on CIWA protocol.  Will start patient on Librium in a few hours to prevent any complicated withdrawals.  We will check basic blood work for any signs of severe dehydration, electrolyte derangements, alcoholic ketoacidosis.  Will consult psychiatry and [facility].    _________________________ 5:08 AM on 11/22/2020 -----------------------------------------  Labs no significant abnormalities other than alcohol level of 301.  Patient will be monitored in the ED until clinically sober for psychiatric evaluation in the morning.  Patient remains voluntarily   The patient has been placed in psychiatric observation due to the need to provide a safe environment for the patient while obtaining psychiatric consultation and evaluation, as well as ongoing medical and medication management to treat the patient's  condition.  The patient has not been placed under full IVC at this time.    Please note:  Patient was evaluated in Emergency Department today for the symptoms described in the history of present illness. Patient was evaluated in the context of the global COVID-19 pandemic, which necessitated consideration that the patient might be at risk for infection with the SARS-CoV-2 virus that causes COVID-19. Institutional protocols and algorithms that pertain to the evaluation of patients at risk for COVID-19 are in a state of rapid change based on information released by regulatory bodies including the CDC and federal and state organizations. These policies and algorithms were followed during the patient's care in the ED.  Some ED evaluations and interventions may be delayed as a result of limited staffing during the pandemic.   ____________________________________________   FINAL CLINICAL IMPRESSION(S) / ED DIAGNOSES   Final diagnoses:  Alcohol dependence with intoxication with complication (HCC)  Grieving  Depression, unspecified depression type      NEW MEDICATIONS STARTED DURING THIS VISIT:  ED Discharge Orders    None       Note:  This document was prepared using Dragon voice recognition software and may include unintentional dictation errors.     Nita Sickle, MD 11/22/20 [phone]

## 2020-11-22 NOTE — ED Notes (Signed)
Pt dressed out at this time with staff, pt belongings placed in bag labeled with name, bag 1 of 1. Pt belongings include:  Boots Black socks Blue underwear Pants Shirt Keys phone

## 2020-11-22 NOTE — BH Assessment (Signed)
Comprehensive Clinical Assessment (CCA) Note  11/22/2020 Chad Walsh [id number]  Chief Complaint: Patient is a 40 year old male presenting to [facility] ED voluntarily due to alcohol intoxication. Per triage note Pt arrived via [facility] from home where he called [facility] due to intoxication and depression. Sheriff on scene reported pt blew a 0.37. Pt calm and cooperative. Pt denies SI and HI as well as desire for detox. Pt reports increased sadness and decreased solid food intake. This is patient's second visit to this ED within 24 hours for similar presentation. Patient appears intoxicated, alert and oriented. Patient reports that he continues to drink when he gets home and reports that he has not eaten anything in a week.  When patient presented to this ED yesterday his plan for discharge was to follow-up with [facility] for grief therapy and substance abuse treatment, but patient did not follow-up. Patient's mother passed in November 2021 and has experienced depression and anxiety since. Patient BAL is currently 301. Patient denies SI/HI/AH/VH and does not appear to be responding to any internal or external stimuli.  Per Psyc NP Elenore PaddyJackie Thompson patient to be reassessed Chief Complaint  Patient presents with  . Alcohol Intoxication  . Depression   Visit Diagnosis: Alcohol Abuse, Depression   CCA Screening, Triage and Referral (STR)  Patient Reported Information How did you hear about us? Other (Comment)  Referral name: No data recorded Referral phone number: No data recorded  Whom do you see for routine medical problems? Other (Comment)  Practice/Facility Name: No data recorded Practice/Facility Phone Number: No data recorded Name of Contact: No data recorded Contact Number: No data recorded Contact Fax Number: No data recorded Prescriber Name: No data recorded Prescriber Address (if known): No data recorded  What Is the Reason for Your Visit/Call Today? No data recorded How Long Has This Been  Causing You Problems? > than 6 months  What Do You Feel Would Help You the Most Today? Alcohol or Drug Use Treatment; Treatment for Depression or other mood problem   Have You Recently Been in Any Inpatient Treatment (Hospital/Detox/Crisis Center/28-Day Program)? No  Name/Location of Program/Hospital:No data recorded How Long Were You There? No data recorded When Were You Discharged? No data recorded  Have You Ever Received Services From [facility] Before? No  Who Do You See at [facility]? No data recorded  Have You Recently Had Any Thoughts About Hurting Yourself? No  Are You Planning to Commit Suicide/Harm Yourself At This time? No   Have you Recently Had Thoughts About Hurting Someone Karolee Ohslse? No  Explanation: No data recorded  Have You Used Any Alcohol or Drugs in the Past 24 Hours? Yes  How Long Ago Did You Use Drugs or Alcohol? No data recorded What Did You Use and How Much? Alcohol   Do You Currently Have a Therapist/Psychiatrist? No  Name of Therapist/Psychiatrist: No data recorded  Have You Been Recently Discharged From Any Office Practice or Programs? No  Explanation of Discharge From Practice/Program: No data recorded    CCA Screening Triage Referral Assessment Type of Contact: Face-to-Face  Is this Initial or Reassessment? No data recorded Date Telepsych consult ordered in [facility]:  08/01/2020  Time Telepsych consult [facility]:  0407   Patient Reported Information Reviewed? Yes  Patient Left Without Being Seen? No data recorded Reason for Not Completing Assessment: No data recorded  Collateral Involvement: No data recorded  Does Patient Have a Court Appointed Legal Guardian? No data recorded Name and Contact  of Legal Guardian: Self  If Minor and Not Living with Parent(s), Who has Custody? No data recorded Is CPS involved or ever been involved? Never  Is APS involved or ever been involved? Never   Patient Determined To Be At Risk for Harm To  Self or Others Based on Review of Patient Reported Information or Presenting Complaint? No  Method: No data recorded Availability of Means: No data recorded Intent: No data recorded Notification Required: No data recorded Additional Information for Danger to Others Potential: No data recorded Additional Comments for Danger to Others Potential: No data recorded Are There Guns or Other Weapons in Your Home? No data recorded Types of Guns/Weapons: No data recorded Are These Weapons Safely Secured?                            No data recorded Who Could Verify You Are Able To Have These Secured: No data recorded Do You Have any Outstanding Charges, Pending Court Dates, Parole/Probation? No data recorded Contacted To Inform of Risk of Harm To Self or Others: No data recorded  Location of Assessment: [facility] ED   Does Patient Present under Involuntary Commitment? No  IVC Papers Initial File Date: No data recorded  [place] of Residence: [place]   Patient Currently Receiving the Following Services: No data recorded  Determination of Need: Emergent (2 hours)   Options For Referral: No data recorded    CCA Biopsychosocial Intake/Chief Complaint:  Patient is presenting under the influence of alcohol with depression  Current Symptoms/Problems: Patient is presenting under the influence of alcohol with depression   Patient Reported Schizophrenia/Schizoaffective Diagnosis in Past: No   Strengths: Patient is able to communicate  Preferences: Unknown  Abilities: Patient is able to communicate   Type of Services Patient Feels are Needed: Unknown   Initial Clinical Notes/Concerns: None   Mental Health Symptoms Depression:  Change in energy/activity; Hopelessness; Sleep (too much or little)   Duration of Depressive symptoms: Greater than two weeks   Mania:  None   Anxiety:   Difficulty concentrating; Restlessness   Psychosis:  None   Duration of Psychotic symptoms: No data  recorded  Trauma:  None   Obsessions:  None   Compulsions:  None   Inattention:  None   Hyperactivity/Impulsivity:  N/A   Oppositional/Defiant Behaviors:  None   Emotional Irregularity:  None   Other Mood/Personality Symptoms:  No data recorded   Mental Status Exam Appearance and self-care  Stature:  Average   Weight:  Average weight   Clothing:  Disheveled   Grooming:  Neglected   Cosmetic use:  None   Posture/gait:  Normal   Motor activity:  Not Remarkable   Sensorium  Attention:  Normal   Concentration:  Normal   Orientation:  X5   Recall/memory:  Normal   Affect and Mood  Affect:  Depressed   Mood:  Depressed   Relating  Eye contact:  Normal   Facial expression:  Responsive   Attitude toward examiner:  Cooperative   Thought and Language  Speech flow: Clear and Coherent   Thought content:  Appropriate to Mood and Circumstances   Preoccupation:  Obsessions   Hallucinations:  None   Organization:  No data recorded  [employer] of Knowledge:  Fair   Intelligence:  Average   Abstraction:  Normal   Judgement:  Fair   Dance movement psychotherapist:  Realistic   Insight:  Fair  Decision Making:  Normal   Social Functioning  Social Maturity:  Isolates   Social Judgement:  Normal   Stress  Stressors:  Grief/losses; Financial   Coping Ability:  Exhausted   Skill Deficits:  None   Supports:  Support needed     Religion: Religion/Spirituality Are You A Religious Person?: No  Leisure/Recreation: Leisure / Recreation Do You Have Hobbies?: No  Exercise/Diet: Exercise/Diet Do You Exercise?: No Have You Gained or Lost A Significant Amount of Weight in the Past Six Months?: No Do You Follow a Special Diet?: No Do You Have Any Trouble Sleeping?: No   CCA Employment/Education Employment/Work Situation: Employment / Work Psychologist, occupational Employment situation: Unemployed Has patient ever been in the [employer]?:  No  Education: Education Is Patient Currently Attending School?: No Did Designer, television/film set?: No Did You Have An Individualized Education Program (IIEP): No Did You Have Any Difficulty At [employer]?: No Patient's Education Has Been Impacted by Current Illness: No   CCA Family/Childhood History Family and Relationship History: Family history Marital status: Single Are you sexually active?:  (Unknown) What is your sexual orientation?: Unknown Does patient have children?: No  Childhood History:  Childhood History Additional childhood history information: None reported Description of patient's relationship with caregiver when they were a child: None reported Patient's description of current relationship with people who raised him/her: None reported How were you disciplined when you got in trouble as a child/adolescent?: None reported Does patient have siblings?:  (UTA) Did patient suffer any verbal/emotional/physical/sexual abuse as a child?: No Did patient suffer from severe childhood neglect?: No Has patient ever been sexually abused/assaulted/raped as an adolescent or adult?: No Was the patient ever a victim of a crime or a disaster?: No Witnessed domestic violence?: No Has patient been affected by domestic violence as an adult?: No  Child/Adolescent Assessment:     CCA Substance Use Alcohol/Drug Use: Alcohol / Drug Use Pain Medications: See MAR Prescriptions: See MAR Over the Counter: See MAR History of alcohol / drug use?: Yes Withdrawal Symptoms: Sweats Substance #1 Name of Substance 1: Alcohol 1 - Amount (size/oz): Unable to quantify 1 - Frequency: daily 1 - Last Use / Amount: Unable to quantify, last used 11/21/20 1- Route of Use: Oral                       ASAM's:  Six Dimensions of Multidimensional Assessment  Dimension 1:  Acute Intoxication and/or Withdrawal Potential:      Dimension 2:  Biomedical Conditions and Complications:       Dimension 3:  Emotional, Behavioral, or Cognitive Conditions and Complications:     Dimension 4:  Readiness to Change:     Dimension 5:  Relapse, Continued use, or Continued Problem Potential:     Dimension 6:  Recovery/Living Environment:     ASAM Severity Score:    ASAM Recommended Level of Treatment:     Substance use Disorder (SUD) Substance Use Disorder (SUD)  Checklist Symptoms of Substance Use: Continued use despite having a persistent/recurrent physical/psychological problem caused/exacerbated by use,Evidence of tolerance,Evidence of withdrawal (Comment),Large amounts of time spent to obtain, use or recover from the substance(s),Continued use despite persistent or recurrent social, interpersonal problems, caused or exacerbated by use,Persistent desire or unsuccessful efforts to cut down or control use,Repeated use in physically hazardous situations,Recurrent use that results in a failure to fulfill major role obligations (work, school, home),Presence of craving or strong urge to use,Social, occupational, recreational activities given up  or reduced due to use,Substance(s) often taken in larger amounts or over longer times than was intended  Recommendations for Services/Supports/Treatments:   Per Psyc NP Elenore Paddy patient to be reassessed  DSM5 Diagnoses: Patient Active Problem List   Diagnosis Date Noted  . Closed left ankle fracture 09/16/2020  . Transaminitis 09/16/2020  . Hypokalemia 09/16/2020  . Subdural hematoma, post-traumatic (HCC) 09/16/2020  . Thrombocytopenia (HCC) 09/16/2020  . Alcohol abuse 08/01/2020  . Depression 08/01/2020  . Grieving 08/01/2020  . Gastric reflux 08/01/2020  . Vitamin D deficiency 08/01/2020    Patient Centered Plan: Patient is on the following Treatment Plan(s):  Depression and Substance Abuse   Referrals to Alternative Service(s): Referred to Alternative Service(s):   Place:   Date:   Time:    Referred to Alternative Service(s):    Place:   Date:   Time:    Referred to Alternative Service(s):   Place:   Date:   Time:    Referred to Alternative Service(s):   Place:   Date:   Time:     Courvoisier Hamblen A Marua Qin, LCAS-A

## 2020-11-22 NOTE — ED Notes (Signed)
Pt was unable to provide a urine sample when he went to restroom to dress out, will attempt again soon

## 2020-11-23 NOTE — ED Notes (Signed)
Pt given breakfast tray

## 2020-11-23 NOTE — ED Notes (Signed)
Pt sleeping at this time. Respirations evan and unlabored. Will reassess vitals once pt awake.

## 2020-11-23 NOTE — ED Provider Notes (Signed)
-----------------------------------------   9:08 AM on 11/23/2020 -----------------------------------------  Patient will be discharged [facility] shortly once they arrive.   Minna Antis, MD 11/23/20 [phone]

## 2020-11-23 NOTE — ED Provider Notes (Signed)
Emergency Medicine Observation Re-evaluation Note  DABNEY SCHANZ is a 40 y.o. male, seen on rounds today.  Pt initially presented to the ED for complaints of Mental Health Problem Currently, the patient is resting, voices no medical complaint.  Physical Exam  BP (!) 144/89 (BP Location: Left Arm)   Pulse 96   Temp 98.3 F (36.8 C) (Oral)   Resp 18   Ht 5\' 8"  (1.727 m)   Wt 91.6 kg   SpO2 100%   BMI 30.71 kg/m  Physical Exam General: Resting in no acute distress Cardiac: No cyanosis Lungs: Equal rise and fall Psych: Not agitated  ED Course / MDM  EKG:   I have reviewed the labs performed to date as well as medications administered while in observation.  Recent changes in the last 24 hours include no events overnight.  Plan  Current plan is for [facility] this morning. Patient is not under full IVC at this time.   , MD 11/23/20 [phone]

## 2021-04-15 ENCOUNTER — Other Ambulatory Visit: Payer: Self-pay

## 2021-04-15 ENCOUNTER — Emergency Department: Payer: Self-pay

## 2021-04-15 ENCOUNTER — Emergency Department
Admission: EM | Admit: 2021-04-15 | Discharge: 2021-04-16 | Disposition: A | Discharge status: Home / Self Care | Payer: Self-pay | Attending: Emergency Medicine | Admitting: Emergency Medicine

## 2021-04-15 ENCOUNTER — Emergency Department
Admission: EM | Admit: 2021-04-15 | Discharge: 2021-04-15 | Disposition: A | Discharge status: Home / Self Care | Payer: Self-pay | Attending: Emergency Medicine | Admitting: Emergency Medicine

## 2021-04-15 DIAGNOSIS — F1010 Alcohol abuse, uncomplicated: Secondary | ICD-10-CM

## 2021-04-15 DIAGNOSIS — Z79899 Other long term (current) drug therapy: Secondary | ICD-10-CM | POA: Insufficient documentation

## 2021-04-15 DIAGNOSIS — Y906 Blood alcohol level of 120-199 mg/100 ml: Secondary | ICD-10-CM | POA: Insufficient documentation

## 2021-04-15 DIAGNOSIS — M79675 Pain in left toe(s): Secondary | ICD-10-CM | POA: Insufficient documentation

## 2021-04-15 DIAGNOSIS — R000 Tachycardia, unspecified: Secondary | ICD-10-CM | POA: Insufficient documentation

## 2021-04-15 DIAGNOSIS — F10120 Alcohol abuse with intoxication, uncomplicated: Secondary | ICD-10-CM | POA: Insufficient documentation

## 2021-04-15 DIAGNOSIS — Z87891 Personal history of nicotine dependence: Secondary | ICD-10-CM | POA: Insufficient documentation

## 2021-04-15 LAB — COMPREHENSIVE METABOLIC PANEL
ALT: 47 U/L — ABNORMAL HIGH (ref 0–44)
AST: 65 U/L — ABNORMAL HIGH (ref 15–41)
Albumin: 4.7 g/dL (ref 3.5–5.0)
Alkaline Phosphatase: 87 U/L (ref 38–126)
Anion gap: 13 (ref 5–15)
BUN: 10 mg/dL (ref 6–20)
CO2: 25 mmol/L (ref 22–32)
Calcium: 9.1 mg/dL (ref 8.9–10.3)
Chloride: 100 mmol/L (ref 98–111)
Creatinine, Ser: 0.86 mg/dL (ref 0.61–1.24)
GFR, Estimated: >60 mL/min (ref >60)
Glucose, Bld: 142 mg/dL — ABNORMAL HIGH (ref 70–99)
Potassium: 3.5 mmol/L (ref 3.5–5.1)
Sodium: 138 mmol/L (ref 135–145)
Total Bilirubin: 1.5 mg/dL — ABNORMAL HIGH (ref 0.3–1.2)
Total Protein: 7.9 g/dL (ref 6.5–8.1)

## 2021-04-15 LAB — CBC WITH DIFFERENTIAL/PLATELET
Abs Immature Granulocytes: 0.05 10*3/uL (ref 0.00–0.07)
Basophils Absolute: 0.1 10*3/uL (ref 0.0–0.1)
Basophils Relative: 1 %
Eosinophils Absolute: 0 10*3/uL (ref 0.0–0.5)
Eosinophils Relative: 0 %
HCT: 39.2 % (ref 39.0–52.0)
Hemoglobin: 13.9 g/dL (ref 13.0–17.0)
Immature Granulocytes: 0 %
Lymphocytes Relative: 34 %
Lymphs Abs: 4.3 10*3/uL — ABNORMAL HIGH (ref 0.7–4.0)
MCH: 31.4 pg (ref 26.0–34.0)
MCHC: 35.5 g/dL (ref 30.0–36.0)
MCV: 88.7 fL (ref 80.0–100.0)
Monocytes Absolute: 1.2 10*3/uL — ABNORMAL HIGH (ref 0.1–1.0)
Monocytes Relative: 10 %
Neutro Abs: 7.1 10*3/uL (ref 1.7–7.7)
Neutrophils Relative %: 55 %
Platelets: 277 10*3/uL (ref 150–400)
RBC: 4.42 MIL/uL (ref 4.22–5.81)
RDW: 13.3 % (ref 11.5–15.5)
WBC: 12.7 10*3/uL — ABNORMAL HIGH (ref 4.0–10.5)
nRBC: 0 % (ref 0.0–0.2)

## 2021-04-15 LAB — MAGNESIUM: Magnesium: 1.9 mg/dL (ref 1.7–2.4)

## 2021-04-15 LAB — ETHANOL: Alcohol, Ethyl (B): 194 mg/dL — ABNORMAL HIGH (ref <10)

## 2021-04-15 MED ORDER — LORAZEPAM 2 MG/ML IJ SOLN: 0.0000 mg | Freq: Four times a day (QID) | INTRAMUSCULAR | Status: DC

## 2021-04-15 MED ORDER — LORAZEPAM 1 MG PO TABS
1.0000 mg | ORAL_TABLET | Freq: Once | ORAL | Status: AC
  Administered 2021-04-15: 1 mg via ORAL
  Filled 2021-04-15: qty 1

## 2021-04-15 MED ORDER — LORAZEPAM 2 MG PO TABS
0.0000 mg | ORAL_TABLET | Freq: Four times a day (QID) | ORAL | Status: DC
  Administered 2021-04-15: 2 mg via ORAL
  Filled 2021-04-15: qty 1

## 2021-04-15 MED ORDER — LORAZEPAM 2 MG/ML IJ SOLN
0.0000 mg | Freq: Two times a day (BID) | INTRAMUSCULAR | Status: DC
Start: 2021-04-18 — End: 2021-04-16

## 2021-04-15 MED ORDER — CEPHALEXIN 500 MG PO CAPS: 500.0000 mg | ORAL_CAPSULE | Freq: Four times a day (QID) | ORAL | 0 refills | Status: AC

## 2021-04-15 MED ORDER — CHLORDIAZEPOXIDE HCL 25 MG PO CAPS: ORAL_CAPSULE | ORAL | 0 refills | Status: AC

## 2021-04-15 MED ORDER — THIAMINE HCL 100 MG/ML IJ SOLN: 100.0000 mg | Freq: Every day | INTRAMUSCULAR | Status: DC

## 2021-04-15 MED ORDER — LORAZEPAM 2 MG PO TABS: 0.0000 mg | ORAL_TABLET | Freq: Two times a day (BID) | ORAL | Status: DC

## 2021-04-15 MED ORDER — LACTATED RINGERS IV BOLUS
1000.0000 mL | Freq: Once | INTRAVENOUS | Status: AC
  Administered 2021-04-15: 1000 mL via INTRAVENOUS

## 2021-04-15 MED ORDER — SODIUM CHLORIDE 0.9 % IV BOLUS: 1000.0000 mL | Freq: Once | INTRAVENOUS | Status: DC

## 2021-04-15 MED ORDER — THIAMINE HCL 100 MG PO TABS: 100.0000 mg | ORAL_TABLET | Freq: Every day | ORAL | Status: DC

## 2021-04-15 NOTE — ED Notes (Signed)
At approximately [phone], patient removed himself from monitor leads This RN entered room at 0518 to complete orders and found room empty with monitor leads on the floor Patient has repeatedly threatened to leave and removed himself from the monitor with this RN reminding patient multiple times to not remove himself from the monitor Patient does not currently have PIV in place

## 2021-04-15 NOTE — ED Provider Notes (Signed)
[facility] Emergency Department Provider Note  ____________________________________________   Event Date/Time   First MD Initiated Contact with Patient 04/15/21 2219     (approximate)  I have reviewed the triage vital signs and the nursing notes.   HISTORY  Chief Complaint Alcohol Problem and detox   HPI Chad Walsh is a 40 y.o. male with a past medical history of anxiety and depression as well as GERD and alcohol abuse stating he recently started drinking again about middle of last week after 100 days sober who presents requesting assistance with his alcohol use and detox.  Patient also states he has had some pain in his left second toe over the last for 5 days but is not sure exactly when it started.  He does not recall any injuries.  He denies any SI or HI or hallucinations.  States he has been drinking quite heavily both alcohol and beer and is not exactly how much her last couple days.  States his last drink was around 5 PM today.  He denies any headache, earache, sore throat, nausea, vomiting, diarrhea, dysuria, rash or any other recent associated sick symptoms.  No recent injuries.  He denies any other acute concerns at this time.  States he is amenable to starting on Librium if he can get feeling a little better emergency room has a plan to follow-up with [facility] tomorrow.         Past Medical History:  Diagnosis Date   Anxiety    Depression    ETOH abuse    GERD (gastroesophageal reflux disease)     Patient Active Problem List   Diagnosis Date Noted   Closed left ankle fracture 09/16/2020   Transaminitis 09/16/2020   Hypokalemia 09/16/2020   Subdural hematoma, post-traumatic (HCC) 09/16/2020   Thrombocytopenia (HCC) 09/16/2020   Alcohol abuse 08/01/2020   Depression 08/01/2020   Grieving 08/01/2020   Gastric reflux 08/01/2020   Vitamin D deficiency 08/01/2020    Past Surgical History:  Procedure Laterality Date   ORIF ANKLE  FRACTURE Left 09/27/2020   Procedure: OPEN REDUCTION INTERNAL FIXATION (ORIF) ANKLE FRACTURE, SYNDOSIS REPAIR;  Surgeon: Signa Kell, MD;  Location: [facility] ORS;  Service: Orthopedics;  Laterality: Left;   TONSILLECTOMY     WISDOM TOOTH EXTRACTION      Prior to Admission medications   Medication Sig Start Date End Date Taking? Authorizing Provider  cephALEXin (KEFLEX) 500 MG capsule Take 1 capsule (500 mg total) by mouth 4 (four) times daily for 7 days. 04/15/21 04/22/21 Yes Gilles Chiquito, MD  chlordiazePOXIDE (LIBRIUM) 25 MG capsule Take 2 capsules (50 mg total) by mouth every 6 (six) hours for 1 day, THEN 1 capsule (25 mg total) every 6 (six) hours for 1 day, THEN 1 capsule (25 mg total) every 12 (twelve) hours for 1 day, THEN 1 capsule (25 mg total) at bedtime for 1 day. 04/15/21 04/19/21 Yes Gilles Chiquito, MD  acetaminophen (TYLENOL) 500 MG tablet Take 2 tablets (1,000 mg total) by mouth every 8 (eight) hours. 09/27/20 09/27/21  Signa Kell, MD  Cholecalciferol (VITAMIN D3) 50 MCG (2000 UT) CAPS Take by mouth daily.    [provider]  Multiple Vitamins-Minerals (MULTIVITAMIN MEN PO) Take by mouth daily.    [provider]  omeprazole (PRILOSEC) 20 MG capsule Take 20 mg by mouth daily.    [provider]  ondansetron (ZOFRAN ODT) 4 MG disintegrating tablet Take 1 tablet (4 mg total) by mouth every  8 (eight) hours as needed for nausea or vomiting. 09/27/20   Signa Kell, MD  oxycodone (OXY-IR) 5 MG capsule Take 5 mg by mouth every 4 (four) hours as needed.    [provider]  oxyCODONE (ROXICODONE) 5 MG immediate release tablet Take 1-2 tablets (5-10 mg total) by mouth every 4 (four) hours as needed (pain). 09/27/20 09/27/21  Signa Kell, MD    Allergies Patient has no known allergies.  Family History  Problem Relation Age of Onset   Lung cancer Mother     Social History Social History   Tobacco Use   Smoking status: Former    Packs/day: 0.50    Years:  18.00    Pack years: 9.00    Types: Cigarettes    Quit date: 03/25/2020    Years since quitting: 1.0   Smokeless tobacco: Never  Vaping Use   Vaping Use: Never used  Substance Use Topics   Alcohol use: Yes    Alcohol/week: 28.0 standard drinks    Types: 28 Standard drinks or equivalent per week    Comment: 09/25/20 - pt reports no alcohol last 2 weeks   Drug use: Not Currently    Types: Marijuana    Comment: early 2021 last used    Review of Systems  Review of Systems  Constitutional:  Negative for chills and fever.  HENT:  Negative for sore throat.   Eyes:  Negative for pain.  Respiratory:  Negative for cough and stridor.   Cardiovascular:  Negative for chest pain.  Gastrointestinal:  Negative for vomiting.  Genitourinary:  Negative for dysuria.  Musculoskeletal:  Positive for joint pain (L 2nd toe) and myalgias (L 2nd toe).  Skin:  Negative for rash.  Neurological:  Negative for seizures, loss of consciousness and headaches.  Psychiatric/Behavioral:  Positive for substance abuse. Negative for suicidal ideas.   All other systems reviewed and are negative.    ____________________________________________   PHYSICAL EXAM:  VITAL SIGNS: ED Triage Vitals  Enc Vitals Group     BP 04/15/21 2104 (!) 148/98     Pulse Rate 04/15/21 2103 (!) 117     Resp 04/15/21 2103 18     Temp 04/15/21 2103 98.5 F (36.9 C)     Temp Source 04/15/21 2103 Oral     SpO2 04/15/21 2103 96 %     Weight 04/15/21 2102 220 lb (99.8 kg)     Height 04/15/21 2102 5\' 8"  (1.727 m)     Head Circumference --      Peak Flow --      Pain Score 04/15/21 2100 3     Pain Loc --      Pain Edu? --      Excl. in GC? --    Vitals:   04/15/21 2104 04/15/21 2318  BP: (!) 148/98   Pulse:  (!) 102  Resp:    Temp:    SpO2:     Physical Exam Vitals and nursing note reviewed.  Constitutional:      Appearance: He is well-developed.  HENT:     Head: Normocephalic and atraumatic.     Right Ear: External  ear normal.     Left Ear: External ear normal.     Nose: Nose normal.  Eyes:     Conjunctiva/sclera: Conjunctivae normal.  Cardiovascular:     Rate and Rhythm: Regular rhythm. Tachycardia present.     Heart sounds: No murmur heard. Pulmonary:     Effort: Pulmonary effort is  normal. No respiratory distress.     Breath sounds: Normal breath sounds.  Abdominal:     Palpations: Abdomen is soft.     Tenderness: There is no abdominal tenderness.  Musculoskeletal:     Cervical back: Neck supple.  Skin:    General: Skin is warm and dry.     Capillary Refill: Capillary refill takes less than 2 seconds.  Neurological:     Mental Status: He is alert and oriented to person, place, and time.    2+ radial and DP pulses.  Patient has some ecchymosis and edema as well as some tenderness around the left second toe extending to the tarsal phalangeal joint.  Remainder of toes are unremarkable.  Sensation is intact light touch throughout the left foot.  He is able to move his toes on command a little less and left foot secondary to pain. ____________________________________________   LABS (all labs ordered are listed, but only abnormal results are displayed)  Labs Reviewed  CBC WITH DIFFERENTIAL/PLATELET - Abnormal; Notable for the following components:      Result Value   WBC 12.7 (*)    Lymphs Abs 4.3 (*)    Monocytes Absolute 1.2 (*)    All other components within normal limits  COMPREHENSIVE METABOLIC PANEL - Abnormal; Notable for the following components:   Glucose, Bld 142 (*)    AST 65 (*)    ALT 47 (*)    Total Bilirubin 1.5 (*)    All other components within normal limits  ETHANOL - Abnormal; Notable for the following components:   Alcohol, Ethyl (B) 194 (*)    All other components within normal limits  MAGNESIUM   ____________________________________________  EKG  ____________________________________________  RADIOLOGY  ED MD interpretation: Plain film of the left foot  shows no fracture or dislocation.  There is evidence of osteopenia.  Official radiology report(s): DG Foot Complete Left  Result Date: 04/15/2021 CLINICAL DATA:  Left foot pain. EXAM: LEFT FOOT - COMPLETE 3+ VIEW COMPARISON:  CT of the left ankle dated 09/16/2020. FINDINGS: There is no acute fracture or dislocation. The bones are osteopenic. Partially visualized distal fibula fixation sideplate and screws. There is mild subcutaneous edema of the distal calf. IMPRESSION: 1. No acute fracture or dislocation. 2. Osteopenia. Electronically Signed   By: Elgie Collard M.D.   On: 04/15/2021 22:46    ____________________________________________   PROCEDURES  Procedure(s) performed (including Critical Care):  Procedures   ____________________________________________   INITIAL IMPRESSION / ASSESSMENT AND PLAN / ED COURSE      Presents with above-stated history exam for 2 complaints for which she was seen earlier today but apparently eloped return again asking for assistance with alcohol abuse requesting detox and for assessment of some soreness in his left second toe.  On arrival patient is tachycardic at 117 and so hypertensive at 148 with otherwise stable vital signs on room air.  He does not appear grossly intoxicated at this time.  He denies any other acute complaints.  He has regard to his tachycardia and hypertension this could be from mild intoxication versus some early withdrawal.  We will also treat for some mild dehydration with IV fluids and check basic electrolytes and hemoglobin.  Low suspicion for significant arrhythmia, ACS, PE or other immediate life-threatening process at this time.  With regard to the left second toe is edematous erythematous and tender.  Differential includes acute traumatic injury with contusion versus fracture differential.  It is also possible patient has developed  a cellulitis although is no streaking proximally crepitus or other findings on history exam  to suggest necrotizing section I have a low suspicion for acute osteomyelitis.  There is no evidence of fracture or dislocation on plain film noted concern for possible early cellulitis will cover patient with a course of Keflex.  CMP today shows no significant electrolyte or metabolic derangements.  There is a mild transaminitis which is suspect is related to patient's alcohol abuse.  CBC shows mild leukocytosis somewhat nonspecific as I do not believe patient is septic and could be reactive in the setting of cellulitis or trauma patient with a contusion to the foot.  No acute anemia.  Magnesium is within normal limits.  Ethanol is elevated at 194.  Patient given some IV fluids and Ativan on reassessment heart rate is 102.  He has never had alcohol drawl seizure.  We will give 1 additional dose of p.o. Ativan and patient states he will start Librium first thing in the morning.  I think he is stable for discharge with close outpatient follow-up.  Discharged stable condition.  Strict return precautions advised and discussed.     ____________________________________________   FINAL CLINICAL IMPRESSION(S) / ED DIAGNOSES  Final diagnoses:  ETOH abuse  Pain of toe of left foot    Medications  LORazepam (ATIVAN) injection 0-4 mg ( Intravenous See Alternative 04/15/21 2250)    Or  LORazepam (ATIVAN) tablet 0-4 mg (2 mg Oral Given 04/15/21 2250)  LORazepam (ATIVAN) injection 0-4 mg (has no administration in time range)    Or  LORazepam (ATIVAN) tablet 0-4 mg (has no administration in time range)  thiamine tablet 100 mg (has no administration in time range)    Or  thiamine (B-1) injection 100 mg (has no administration in time range)  LORazepam (ATIVAN) tablet 1 mg (has no administration in time range)  lactated ringers bolus 1,000 mL (1,000 mLs Intravenous New Bag/Given 04/15/21 2249)     ED Discharge Orders          Ordered    chlordiazePOXIDE (LIBRIUM) 25 MG capsule        04/15/21 2238     cephALEXin (KEFLEX) 500 MG capsule  4 times daily        04/15/21 2312             Note:  This document was prepared using Dragon voice recognition software and may include unintentional dictation errors.    Gilles ChiquitoSmith, Richardson Dubree P, MD 04/15/21 [phone]

## 2021-04-15 NOTE — ED Triage Notes (Addendum)
Pt states that he has been sober for 140 days and then this past Wednesday began drinking again. Pt states he is here because he is requesting detox. Pt cooperative in triage. Pt denies SI/HI or hallucinations. Pt states his last drink was around 1700 tonight.

## 2021-04-15 NOTE — ED Triage Notes (Signed)
Patient arrives via EMS, ambulatory to room, stating "I called 911 because I know you can drink too much and I've been drinking since Wednesday." Patient's speech is clear and he does not smell of ETOH. Patient also states that his second toe on his left foot is bruised/swollen/possibly broken. Patient cannot provide any details regarding how it was injured. MD at bedside to evaluate.

## 2021-04-15 NOTE — Discharge Instructions (Addendum)
Please seek medical attention for any high fevers, chest pain, shortness of breath, change in behavior, persistent vomiting, bloody stool or any other new or concerning symptoms.  

## 2021-04-15 NOTE — ED Provider Notes (Signed)
[facility] Emergency Department Provider Note   ____________________________________________   I have reviewed the triage vital signs and the nursing notes.   HISTORY  Chief Complaint Alcohol abuse   History limited by: Not Limited   HPI Chad Walsh is a 40 y.o. male who presents to the emergency department today via EMS because he is concerned he drank to much. States he has a history of alcohol use in the past. The patient says he has been sober for a few months but than drank again tonight. The patient also has complaints of left 2nd toe pain for the past 4 days. He cannot give any history as to how he might have hurt the toe.    Records reviewed. Per medical record review patient has a history of alcohol abuse.  Past Medical History:  Diagnosis Date   Anxiety    Depression    ETOH abuse    GERD (gastroesophageal reflux disease)     Patient Active Problem List   Diagnosis Date Noted   Closed left ankle fracture 09/16/2020   Transaminitis 09/16/2020   Hypokalemia 09/16/2020   Subdural hematoma, post-traumatic (HCC) 09/16/2020   Thrombocytopenia (HCC) 09/16/2020   Alcohol abuse 08/01/2020   Depression 08/01/2020   Grieving 08/01/2020   Gastric reflux 08/01/2020   Vitamin D deficiency 08/01/2020    Past Surgical History:  Procedure Laterality Date   ORIF ANKLE FRACTURE Left 09/27/2020   Procedure: OPEN REDUCTION INTERNAL FIXATION (ORIF) ANKLE FRACTURE, SYNDOSIS REPAIR;  Surgeon: Signa Kell, MD;  Location: [facility] ORS;  Service: Orthopedics;  Laterality: Left;   TONSILLECTOMY     WISDOM TOOTH EXTRACTION      Prior to Admission medications   Medication Sig Start Date End Date Taking? Authorizing Provider  acetaminophen (TYLENOL) 500 MG tablet Take 2 tablets (1,000 mg total) by mouth every 8 (eight) hours. 09/27/20 09/27/21  Signa Kell, MD  Cholecalciferol (VITAMIN D3) 50 MCG (2000 UT) CAPS Take by mouth daily.    [provider]  Multiple Vitamins-Minerals (MULTIVITAMIN MEN PO) Take by mouth daily.    [provider]  omeprazole (PRILOSEC) 20 MG capsule Take 20 mg by mouth daily.    [provider]  ondansetron (ZOFRAN ODT) 4 MG disintegrating tablet Take 1 tablet (4 mg total) by mouth every 8 (eight) hours as needed for nausea or vomiting. 09/27/20   Signa Kell, MD  oxycodone (OXY-IR) 5 MG capsule Take 5 mg by mouth every 4 (four) hours as needed.    [provider]  oxyCODONE (ROXICODONE) 5 MG immediate release tablet Take 1-2 tablets (5-10 mg total) by mouth every 4 (four) hours as needed (pain). 09/27/20 09/27/21  Signa Kell, MD    Allergies Patient has no known allergies.  Family History  Problem Relation Age of Onset   Lung cancer Mother     Social History Social History   Tobacco Use   Smoking status: Former    Packs/day: 0.50    Years: 18.00    Pack years: 9.00    Types: Cigarettes    Quit date: 03/25/2020    Years since quitting: 1.0   Smokeless tobacco: Never  Vaping Use   Vaping Use: Never used  Substance Use Topics   Alcohol use: Yes    Alcohol/week: 28.0 standard drinks    Types: 28 Standard drinks or equivalent per week    Comment: 09/25/20 - pt reports no alcohol last 2 weeks   Drug use: Not Currently  Types: Marijuana    Comment: early 2021 last used    Review of Systems Constitutional: No fever/chills Eyes: No visual changes. ENT: No sore throat. Cardiovascular: Denies chest pain. Respiratory: Denies shortness of breath. Gastrointestinal: No abdominal pain.  No nausea, no vomiting.  No diarrhea.   Genitourinary: Negative for dysuria. Musculoskeletal: Positive for left 2nd toe pain. Skin: Negative for rash. Neurological: Negative for headaches, focal weakness or numbness.  ____________________________________________   PHYSICAL EXAM:  Constitutional: Awake and alert. Intoxicated appearing.  Eyes: Conjunctivae are normal.  ENT      Head:  Normocephalic and atraumatic.      Nose: No congestion/rhinnorhea.      Mouth/Throat: Mucous membranes are moist.      Neck: No stridor. Hematological/Lymphatic/Immunilogical: No cervical lymphadenopathy. Cardiovascular: Tachycardic, regular rhythm.  No murmurs, rubs, or gallops.  Respiratory: Normal respiratory effort without tachypnea nor retractions. Breath sounds are clear and equal bilaterally. No wheezes/rales/rhonchi. Gastrointestinal: Soft and non tender. No rebound. No guarding.  Genitourinary: Deferred Musculoskeletal: Normal range of motion in all extremities. No lower extremity edema. Left 2nd toe with some bruising noted.  Neurologic:  Normal speech and language. No gross focal neurologic deficits are appreciated.  Skin:  Skin is warm, dry and intact. No rash noted. Psychiatric: Mood and affect are normal. Speech and behavior are normal. Patient exhibits appropriate insight and judgment.  ____________________________________________    LABS (pertinent positives/negatives)  None  ____________________________________________   EKG  None  ____________________________________________    RADIOLOGY  None  ____________________________________________   PROCEDURES  Procedures  ____________________________________________   INITIAL IMPRESSION / ASSESSMENT AND PLAN / ED COURSE  Pertinent labs & imaging results that were available during my care of the patient were reviewed by me and considered in my medical decision making (see chart for details).   Patient presented to the emergency department today because of concern for alcohol use. On exam patient does appear intoxicated but is completely awake and alert. No somnolence. In terms of the patients toe there is some bruising. Did offer and order x-ray however patient refused when x-ray tech did come to take images. Did not think patient required any further emergent evaluation for alcohol use initially however  after initial paperwork was obtained his heart rate was noted to be elevated. Discussed this with the patient, did order blood work and IVFs. After this discussion with the patient, and prior to the nursing staff starting IVFs patient eloped the emergency department.   ____________________________________________   FINAL CLINICAL IMPRESSION(S) / ED DIAGNOSES  Final diagnoses:  ETOH abuse     Note: This dictation was prepared with Dragon dictation. Any transcriptional errors that result from this process are unintentional     Phineas Semen, MD 04/15/21 [phone]

## 2021-04-16 NOTE — ED Notes (Signed)
Patient is discharged to home via self. Discharge instruction given and verbalized understanding. He is stable in NAD.

## 2021-06-11 ENCOUNTER — Emergency Department
Admission: EM | Admit: 2021-06-11 | Discharge: 2021-06-11 | Disposition: A | Discharge status: Home / Self Care | Payer: Self-pay | Attending: Emergency Medicine | Admitting: Emergency Medicine

## 2021-06-11 ENCOUNTER — Other Ambulatory Visit: Payer: Self-pay

## 2021-06-11 DIAGNOSIS — Z87891 Personal history of nicotine dependence: Secondary | ICD-10-CM | POA: Insufficient documentation

## 2021-06-11 DIAGNOSIS — F10129 Alcohol abuse with intoxication, unspecified: Secondary | ICD-10-CM | POA: Insufficient documentation

## 2021-06-11 DIAGNOSIS — E559 Vitamin D deficiency, unspecified: Secondary | ICD-10-CM | POA: Diagnosis present

## 2021-06-11 DIAGNOSIS — R45851 Suicidal ideations: Secondary | ICD-10-CM | POA: Insufficient documentation

## 2021-06-11 DIAGNOSIS — F10920 Alcohol use, unspecified with intoxication, uncomplicated: Secondary | ICD-10-CM

## 2021-06-11 DIAGNOSIS — F321 Major depressive disorder, single episode, moderate: Secondary | ICD-10-CM | POA: Insufficient documentation

## 2021-06-11 DIAGNOSIS — F4381 Prolonged grief disorder: Secondary | ICD-10-CM | POA: Insufficient documentation

## 2021-06-11 DIAGNOSIS — F1010 Alcohol abuse, uncomplicated: Secondary | ICD-10-CM | POA: Diagnosis present

## 2021-06-11 DIAGNOSIS — F4321 Adjustment disorder with depressed mood: Secondary | ICD-10-CM | POA: Diagnosis present

## 2021-06-11 DIAGNOSIS — F32A Depression, unspecified: Secondary | ICD-10-CM | POA: Diagnosis present

## 2021-06-11 DIAGNOSIS — Y908 Blood alcohol level of 240 mg/100 ml or more: Secondary | ICD-10-CM | POA: Insufficient documentation

## 2021-06-11 LAB — URINE DRUG SCREEN, QUALITATIVE (ARMC ONLY)
Amphetamines, Ur Screen: NOT DETECTED
Barbiturates, Ur Screen: NOT DETECTED
Benzodiazepine, Ur Scrn: NOT DETECTED
Cannabinoid 50 Ng, Ur ~~LOC~~: NOT DETECTED
Cocaine Metabolite,Ur ~~LOC~~: NOT DETECTED
MDMA (Ecstasy)Ur Screen: NOT DETECTED
Methadone Scn, Ur: NOT DETECTED
Opiate, Ur Screen: NOT DETECTED
Phencyclidine (PCP) Ur S: NOT DETECTED
Tricyclic, Ur Screen: NOT DETECTED

## 2021-06-11 LAB — COMPREHENSIVE METABOLIC PANEL
ALT: 61 U/L — ABNORMAL HIGH (ref 0–44)
AST: 56 U/L — ABNORMAL HIGH (ref 15–41)
Albumin: 4.6 g/dL (ref 3.5–5.0)
Alkaline Phosphatase: 111 U/L (ref 38–126)
Anion gap: 13 (ref 5–15)
BUN: 11 mg/dL (ref 6–20)
CO2: 25 mmol/L (ref 22–32)
Calcium: 8.6 mg/dL — ABNORMAL LOW (ref 8.9–10.3)
Chloride: 103 mmol/L (ref 98–111)
Creatinine, Ser: 0.9 mg/dL (ref 0.61–1.24)
GFR, Estimated: >60 mL/min (ref >60)
Glucose, Bld: 155 mg/dL — ABNORMAL HIGH (ref 70–99)
Potassium: 3.4 mmol/L — ABNORMAL LOW (ref 3.5–5.1)
Sodium: 141 mmol/L (ref 135–145)
Total Bilirubin: 1.3 mg/dL — ABNORMAL HIGH (ref 0.3–1.2)
Total Protein: 8.1 g/dL (ref 6.5–8.1)

## 2021-06-11 LAB — CBC WITH DIFFERENTIAL/PLATELET
Abs Immature Granulocytes: 0.1 10*3/uL — ABNORMAL HIGH (ref 0.00–0.07)
Basophils Absolute: 0.1 10*3/uL (ref 0.0–0.1)
Basophils Relative: 1 %
Eosinophils Absolute: 0.1 10*3/uL (ref 0.0–0.5)
Eosinophils Relative: 1 %
HCT: 42.3 % (ref 39.0–52.0)
Hemoglobin: 15.1 g/dL (ref 13.0–17.0)
Immature Granulocytes: 1 %
Lymphocytes Relative: 33 %
Lymphs Abs: 3.6 10*3/uL (ref 0.7–4.0)
MCH: 32.3 pg (ref 26.0–34.0)
MCHC: 35.7 g/dL (ref 30.0–36.0)
MCV: 90.4 fL (ref 80.0–100.0)
Monocytes Absolute: 0.7 10*3/uL (ref 0.1–1.0)
Monocytes Relative: 6 %
Neutro Abs: 6.5 10*3/uL (ref 1.7–7.7)
Neutrophils Relative %: 58 %
Platelets: 323 10*3/uL (ref 150–400)
RBC: 4.68 MIL/uL (ref 4.22–5.81)
RDW: 14.8 % (ref 11.5–15.5)
WBC: 11 10*3/uL — ABNORMAL HIGH (ref 4.0–10.5)
nRBC: 0 % (ref 0.0–0.2)

## 2021-06-11 LAB — ACETAMINOPHEN LEVEL: Acetaminophen (Tylenol), Serum: <10 ug/mL — ABNORMAL LOW (ref 10–30)

## 2021-06-11 LAB — SALICYLATE LEVEL: Salicylate Lvl: <7 mg/dL — ABNORMAL LOW (ref 7.0–30.0)

## 2021-06-11 LAB — ETHANOL: Alcohol, Ethyl (B): 386 mg/dL (ref <10)

## 2021-06-11 MED ORDER — THIAMINE HCL 100 MG/ML IJ SOLN: 100.0000 mg | Freq: Every day | INTRAMUSCULAR | Status: DC

## 2021-06-11 MED ORDER — ONDANSETRON 4 MG PO TBDP
4.0000 mg | ORAL_TABLET | Freq: Once | ORAL | Status: AC
  Administered 2021-06-11: 4 mg via ORAL
  Filled 2021-06-11: qty 1

## 2021-06-11 MED ORDER — LORAZEPAM 2 MG/ML IJ SOLN
0.0000 mg | Freq: Four times a day (QID) | INTRAMUSCULAR | Status: DC
  Administered 2021-06-11: 2 mg via INTRAVENOUS
  Filled 2021-06-11: qty 1

## 2021-06-11 MED ORDER — THIAMINE HCL 100 MG PO TABS
100.0000 mg | ORAL_TABLET | Freq: Every day | ORAL | Status: DC
  Administered 2021-06-11: 100 mg via ORAL
  Filled 2021-06-11: qty 1

## 2021-06-11 MED ORDER — LORAZEPAM 2 MG PO TABS
0.0000 mg | ORAL_TABLET | Freq: Four times a day (QID) | ORAL | Status: DC
  Administered 2021-06-11: 1 mg via ORAL
  Filled 2021-06-11: qty 1

## 2021-06-11 MED ORDER — THIAMINE HCL 100 MG/ML IJ SOLN
Freq: Once | INTRAVENOUS | Status: AC
  Filled 2021-06-11: qty 1000

## 2021-06-11 MED ORDER — ESCITALOPRAM OXALATE 10 MG PO TABS: 10.0000 mg | ORAL_TABLET | Freq: Every day | ORAL | 1 refills | Status: DC

## 2021-06-11 NOTE — ED Provider Notes (Signed)
[facility] Emergency Department Provider Note   ____________________________________________   Event Date/Time   First MD Initiated Contact with Patient 06/11/21 0158     (approximate)  I have reviewed the triage vital signs and the nursing notes.   HISTORY  Chief Complaint Depression, SI    HPI Chad Walsh is a 40 y.o. male brought to the ED via EMS from home with a chief complaint of depression, suicidal ideation without plan.  Admits to heavy drinking tonight.  Denies HI/AH/VH.  Voices no medical complaints      Past Medical History:  Diagnosis Date   Anxiety    Depression    ETOH abuse    GERD (gastroesophageal reflux disease)     Patient Active Problem List   Diagnosis Date Noted   Closed left ankle fracture 09/16/2020   Transaminitis 09/16/2020   Hypokalemia 09/16/2020   Subdural hematoma, post-traumatic 09/16/2020   Thrombocytopenia (HCC) 09/16/2020   Alcohol abuse 08/01/2020   Depression 08/01/2020   Grieving 08/01/2020   Gastric reflux 08/01/2020   Vitamin D deficiency 08/01/2020    Past Surgical History:  Procedure Laterality Date   ORIF ANKLE FRACTURE Left 09/27/2020   Procedure: OPEN REDUCTION INTERNAL FIXATION (ORIF) ANKLE FRACTURE, SYNDOSIS REPAIR;  Surgeon: Signa Kell, MD;  Location: [facility] ORS;  Service: Orthopedics;  Laterality: Left;   TONSILLECTOMY     WISDOM TOOTH EXTRACTION      Prior to Admission medications   Medication Sig Start Date End Date Taking? Authorizing Provider  acetaminophen (TYLENOL) 500 MG tablet Take 2 tablets (1,000 mg total) by mouth every 8 (eight) hours. 09/27/20 09/27/21  Signa Kell, MD  Cholecalciferol (VITAMIN D3) 50 MCG (2000 UT) CAPS Take by mouth daily.    [provider]  Multiple Vitamins-Minerals (MULTIVITAMIN MEN PO) Take by mouth daily.    [provider]  omeprazole (PRILOSEC) 20 MG capsule Take 20 mg by mouth daily.    [provider]   ondansetron (ZOFRAN ODT) 4 MG disintegrating tablet Take 1 tablet (4 mg total) by mouth every 8 (eight) hours as needed for nausea or vomiting. 09/27/20   Signa Kell, MD  oxycodone (OXY-IR) 5 MG capsule Take 5 mg by mouth every 4 (four) hours as needed.    [provider]  oxyCODONE (ROXICODONE) 5 MG immediate release tablet Take 1-2 tablets (5-10 mg total) by mouth every 4 (four) hours as needed (pain). 09/27/20 09/27/21  Signa Kell, MD    Allergies Patient has no known allergies.  Family History  Problem Relation Age of Onset   Lung cancer Mother     Social History Social History   Tobacco Use   Smoking status: Former    Packs/day: 0.50    Years: 18.00    Pack years: 9.00    Types: Cigarettes    Quit date: 03/25/2020    Years since quitting: 1.2   Smokeless tobacco: Never  Vaping Use   Vaping Use: Never used  Substance Use Topics   Alcohol use: Yes    Alcohol/week: 28.0 standard drinks    Types: 28 Standard drinks or equivalent per week    Comment: 09/25/20 - pt reports no alcohol last 2 weeks   Drug use: Not Currently    Types: Marijuana    Comment: early 2021 last used    Review of Systems  Constitutional: No fever/chills Eyes: No visual changes. ENT: No sore throat. Cardiovascular: Denies chest pain. Respiratory: Denies shortness of breath. Gastrointestinal: No  abdominal pain.  No nausea, no vomiting.  No diarrhea.  No constipation. Genitourinary: Negative for dysuria. Musculoskeletal: Negative for back pain. Skin: Negative for rash. Neurological: Negative for headaches, focal weakness or numbness. Psychiatric: Positive for depression with suicidal ideation.  ____________________________________________   PHYSICAL EXAM:  VITAL SIGNS: ED Triage Vitals  Enc Vitals Group     BP      Pulse      Resp      Temp      Temp src      SpO2      Weight      Height      Head Circumference      Peak Flow      Pain Score      Pain Loc      Pain Edu?       Excl. in GC?     Constitutional: Alert and oriented. Well appearing and in no acute distress. Eyes: Conjunctivae are normal. PERRL. EOMI. Head: Atraumatic. Nose: No congestion/rhinnorhea. Mouth/Throat: Mucous membranes are moist.   Neck: No stridor.   Cardiovascular: Normal rate, regular rhythm. Grossly normal heart sounds.  Good peripheral circulation. Respiratory: Normal respiratory effort.  No retractions. Lungs CTAB. Gastrointestinal: Soft and nontender. No distention. No abdominal bruits. No CVA tenderness. Musculoskeletal: No lower extremity tenderness nor edema.  No joint effusions. Neurologic:  Normal speech and language. No gross focal neurologic deficits are appreciated. No gait instability. Skin:  Skin is warm, dry and intact. No rash noted. Psychiatric: Mood and affect are flat, tearful. Speech and behavior are normal.  ____________________________________________   LABS (all labs ordered are listed, but only abnormal results are displayed)  Labs Reviewed  CBC WITH DIFFERENTIAL/PLATELET - Abnormal; Notable for the following components:      Result Value   WBC 11.0 (*)    Abs Immature Granulocytes 0.10 (*)    All other components within normal limits  COMPREHENSIVE METABOLIC PANEL - Abnormal; Notable for the following components:   Potassium 3.4 (*)    Glucose, Bld 155 (*)    Calcium 8.6 (*)    AST 56 (*)    ALT 61 (*)    Total Bilirubin 1.3 (*)    All other components within normal limits  ETHANOL - Abnormal; Notable for the following components:   Alcohol, Ethyl (B) 386 (*)    All other components within normal limits  ACETAMINOPHEN LEVEL - Abnormal; Notable for the following components:   Acetaminophen (Tylenol), Serum <10 (*)    All other components within normal limits  SALICYLATE LEVEL - Abnormal; Notable for the following components:   Salicylate Lvl <7.0 (*)    All other components within normal limits  URINE DRUG SCREEN, QUALITATIVE ([facility] ONLY)    ____________________________________________  EKG  None ____________________________________________  RADIOLOGY I, Maley Venezia J, personally viewed and evaluated these images (plain radiographs) as part of my medical decision making, as well as reviewing the written report by the radiologist.  ED MD interpretation: No  Official radiology report(s): No results found.  ____________________________________________   PROCEDURES  Procedure(s) performed (including Critical Care):  Procedures   ____________________________________________   INITIAL IMPRESSION / ASSESSMENT AND PLAN / ED COURSE  As part of my medical decision making, I reviewed the following data within the electronic MEDICAL RECORD NUMBER Nursing notes reviewed and incorporated, Labs reviewed, Old chart reviewed, A consult was requested and obtained from this/these consultant(s) Psychiatry, and Notes from prior ED visits     40 year old male who  presents voluntarily for depression with suicidal ideation without plan.  Contracts for safety while in the ED.  The patient has been placed in psychiatric observation due to the need to provide a safe environment for the patient while obtaining psychiatric consultation and evaluation, as well as ongoing medical and medication management to treat the patient's condition.  The patient has not been placed under full IVC at this time.   Clinical Course as of 06/11/21 0358  Tue Jun 11, 2021  7517 Patient evaluated by psychiatric NP overnight.  Needs reassessment in the morning once he is sober. [JS]    Clinical Course User Index [JS] Irean Hong, MD     ____________________________________________   FINAL CLINICAL IMPRESSION(S) / ED DIAGNOSES  Final diagnoses:  Current moderate episode of major depressive disorder, unspecified whether recurrent (HCC)  Alcoholic intoxication without complication [facility])     ED Discharge Orders     None        Note:  This document  was prepared using Dragon voice recognition software and may include unintentional dictation errors.    Irean Hong, MD 06/11/21 [phone]

## 2021-06-11 NOTE — ED Notes (Signed)
Pt discharging home. Discharge teaching done and prescriptions reviewed with pt and he verbalized understanding. Given all his personal belongings. Ambulating with steady gait and in NAD. Escorted to lobby at discharge.

## 2021-06-11 NOTE — ED Triage Notes (Signed)
BIB [facility] from home. Pt drinking alcohol tonight, became suicidal. Pt called law enforcement for help and subsequently brought into the ER for evaluation. Pt reports he has been depressed due to anniversary of death of mother in 2023-07-23. Pt denies suicidal plan or attempt. Tearful and cooperative on arrival.

## 2021-06-11 NOTE — ED Notes (Signed)
Gave breakfast tray with juice. 

## 2021-06-11 NOTE — Discharge Instructions (Addendum)
Please follow-up with [facility] as you have been.  Please take the Lexapro Dr. Toni Amend prescribed urine return as needed.  Endeavor to avoid alcohol.

## 2021-06-11 NOTE — Consult Note (Signed)
[facility] Face-to-Face Psychiatry Consult   Reason for Consult: Depression, SI  Referring Physician: Dr. Dolores Frame Patient Identification: Chad Walsh MRN:  [id number] Principal Diagnosis: <principal problem not specified> Diagnosis:  Active Problems:   Alcohol abuse   Depression   Grieving   Vitamin D deficiency   Total Time spent with patient: 1 hour  Subjective: "I relapsed tonight." Chad Walsh is a 40 y.o. male patient presented to [facility] ED via law enforcement voluntary from home, the patient shared he had relapse on alcohol due to increased grief, depression, and anxiety. The patient states he is to start grief counseling in two days. The patient is dealing with the loss of his mother in November of 2021. The first anniversary of his mom's death is approaching, and he continues to have a difficult time dealing with the loss.    The patient was seen face-to-face; the chart was reviewed and consulted with Dr. Dolores Frame on 06/11/2021 due to the patient's care. During the patient assessment, he is calm, experiencing some sadness and voicing how the loss of his mother is difficult. The patient disclosed he does not have much of a support system. It was discussed with the EDP that the patient remained under observation overnight and would be discharged in the a.m. once he is sober. The patient's BAL is 386 mg/dl on this visit. The patient expressed feeling bad for relapsing, and he voiced that he knows it is the recovery process. On evaluation, the patient is alert and oriented x 4, emotional but cooperative, and mood-congruent with affect. The patient does not appear to be responding to internal or external stimuli. Neither is the patient presenting with any delusional thinking. The patient denies auditory or visual hallucinations. The patient denies any suicidal, homicidal, or self-harm ideations. The patient is not presenting with any psychotic or paranoid behaviors. During an encounter with the  patient, he could answer questions appropriately.  Disposition: The patient remained under observation overnight and will be discharged in the a.m. once sober.  HPI: Per Dr. Dolores Frame, Chad Walsh is a 40 y.o. male brought to the ED via EMS from home with a chief complaint of depression, suicidal ideation without plan.  Admits to heavy drinking tonight.  Denies HI/AH/VH.  Voices no medical complaints  Past Psychiatric History: Anxiety    Depression   ETOH abuse   Risk to Self:   Risk to Others:   Prior Inpatient Therapy:   Prior Outpatient Therapy:    Past Medical History:  Past Medical History:  Diagnosis Date   Anxiety    Depression    ETOH abuse    GERD (gastroesophageal reflux disease)     Past Surgical History:  Procedure Laterality Date   ORIF ANKLE FRACTURE Left 09/27/2020   Procedure: OPEN REDUCTION INTERNAL FIXATION (ORIF) ANKLE FRACTURE, SYNDOSIS REPAIR;  Surgeon: Signa Kell, MD;  Location: [facility] ORS;  Service: Orthopedics;  Laterality: Left;   TONSILLECTOMY     WISDOM TOOTH EXTRACTION     Family History:  Family History  Problem Relation Age of Onset   Lung cancer Mother    Family Psychiatric  History:  Social History:  Social History   Substance and Sexual Activity  Alcohol Use Yes   Alcohol/week: 28.0 standard drinks   Types: 28 Standard drinks or equivalent per week   Comment: 09/25/20 - pt reports no alcohol last 2 weeks     Social History   Substance and Sexual Activity  Drug Use Not Currently  Types: Marijuana   Comment: early 2021 last used    Social History   Socioeconomic History   Marital status: Single    Spouse name: Not on file   Number of children: Not on file   Years of education: Not on file   Highest education level: Not on file  Occupational History   Not on file  Tobacco Use   Smoking status: Former    Packs/day: 0.50    Years: 18.00    Pack years: 9.00    Types: Cigarettes    Quit date: 03/25/2020    Years since  quitting: 1.2   Smokeless tobacco: Never  Vaping Use   Vaping Use: Never used  Substance and Sexual Activity   Alcohol use: Yes    Alcohol/week: 28.0 standard drinks    Types: 28 Standard drinks or equivalent per week    Comment: 09/25/20 - pt reports no alcohol last 2 weeks   Drug use: Not Currently    Types: Marijuana    Comment: early 2021 last used   Sexual activity: Not on file  Other Topics Concern   Not on file  Social History Narrative   Not on file   Social Determinants of Health   Financial Resource Strain: Not on file  Food Insecurity: Not on file  Transportation Needs: Not on file  Physical Activity: Not on file  Stress: Not on file  Social Connections: Not on file   Additional Social History:    Allergies:  No Known Allergies  Labs:  Results for orders placed or performed during the hospital encounter of 06/11/21 (from the past 48 hour(s))  CBC with Differential     Status: Abnormal   Collection Time: 06/11/21  2:10 AM  Result Value Ref Range   WBC 11.0 (H) 4.0 - 10.5 K/uL   RBC 4.68 4.22 - 5.81 MIL/uL   Hemoglobin 15.1 13.0 - 17.0 g/dL   HCT 76.8 11.5 - 72.6 %   MCV 90.4 80.0 - 100.0 fL   MCH 32.3 26.0 - 34.0 pg   MCHC 35.7 30.0 - 36.0 g/dL   RDW 20.3 55.9 - 74.1 %   Platelets 323 150 - 400 K/uL   nRBC 0.0 0.0 - 0.2 %   Neutrophils Relative % 58 %   Neutro Abs 6.5 1.7 - 7.7 K/uL   Lymphocytes Relative 33 %   Lymphs Abs 3.6 0.7 - 4.0 K/uL   Monocytes Relative 6 %   Monocytes Absolute 0.7 0.1 - 1.0 K/uL   Eosinophils Relative 1 %   Eosinophils Absolute 0.1 0.0 - 0.5 K/uL   Basophils Relative 1 %   Basophils Absolute 0.1 0.0 - 0.1 K/uL   Immature Granulocytes 1 %   Abs Immature Granulocytes 0.10 (H) 0.00 - 0.07 K/uL    Comment: Performed at [facility], [address] [place], [place] [address]  Comprehensive metabolic panel     Status: Abnormal   Collection Time: 06/11/21  2:10 AM  Result Value Ref Range   Sodium 141 135 - 145  mmol/L   Potassium 3.4 (L) 3.5 - 5.1 mmol/L   Chloride 103 98 - 111 mmol/L   CO2 25 22 - 32 mmol/L   Glucose, Bld 155 (H) 70 - 99 mg/dL    Comment: Glucose reference range applies only to samples taken after fasting for at least 8 hours.   BUN 11 6 - 20 mg/dL   Creatinine, Ser 3.64 0.61 - 1.24 mg/dL   Calcium  8.6 (L) 8.9 - 10.3 mg/dL   Total Protein 8.1 6.5 - 8.1 g/dL   Albumin 4.6 3.5 - 5.0 g/dL   AST 56 (H) 15 - 41 U/L   ALT 61 (H) 0 - 44 U/L   Alkaline Phosphatase 111 38 - 126 U/L   Total Bilirubin 1.3 (H) 0.3 - 1.2 mg/dL   GFR, Estimated >73 >22 mL/min    Comment: (NOTE) Calculated using the CKD-EPI Creatinine Equation (2021)    Anion gap 13 5 - 15    Comment: Performed at [facility], [address] [place], [place] [address]  Ethanol     Status: Abnormal   Collection Time: 06/11/21  2:10 AM  Result Value Ref Range   Alcohol, Ethyl (B) 386 (HH) <10 mg/dL    Comment: CRITICAL RESULT CALLED TO, READ BACK BY AND VERIFIED WITH ALLY YOW RN [phone] 06/11/21 HNM (NOTE) Lowest detectable limit for serum alcohol is 10 mg/dL.  For medical purposes only. Performed at [facility], [address] [place], [place] [address]   Acetaminophen level     Status: Abnormal   Collection Time: 06/11/21  2:10 AM  Result Value Ref Range   Acetaminophen (Tylenol), Serum <10 (L) 10 - 30 ug/mL    Comment: (NOTE) Therapeutic concentrations vary significantly. A range of 10-30 ug/mL  may be an effective concentration for many patients. However, some  are best treated at concentrations outside of this range. Acetaminophen concentrations >150 ug/mL at 4 hours after ingestion  and >50 ug/mL at 12 hours after ingestion are often associated with  toxic reactions.  Performed at [facility], [address] [place], [place] [address]   Salicylate level     Status: Abnormal   Collection Time: 06/11/21  2:10 AM  Result Value Ref Range   Salicylate Lvl <7.0 (L) 7.0 -  30.0 mg/dL    Comment: Performed at [facility], [address] [place], [place] [address]  Urine Drug Screen, Qualitative     Status: None   Collection Time: 06/11/21  3:09 AM  Result Value Ref Range   Tricyclic, Ur Screen NONE DETECTED NONE DETECTED   Amphetamines, Ur Screen NONE DETECTED NONE DETECTED   MDMA (Ecstasy)Ur Screen NONE DETECTED NONE DETECTED   Cocaine Metabolite,Ur [place] NONE DETECTED NONE DETECTED   Opiate, Ur Screen NONE DETECTED NONE DETECTED   Phencyclidine (PCP) Ur S NONE DETECTED NONE DETECTED   Cannabinoid 50 Ng, Ur [place] NONE DETECTED NONE DETECTED   Barbiturates, Ur Screen NONE DETECTED NONE DETECTED   Benzodiazepine, Ur Scrn NONE DETECTED NONE DETECTED   Methadone Scn, Ur NONE DETECTED NONE DETECTED    Comment: (NOTE) Tricyclics + metabolites, urine    Cutoff 1000 ng/mL Amphetamines + metabolites, urine  Cutoff 1000 ng/mL MDMA (Ecstasy), urine              Cutoff 500 ng/mL Cocaine Metabolite, urine          Cutoff 300 ng/mL Opiate + metabolites, urine        Cutoff 300 ng/mL Phencyclidine (PCP), urine         Cutoff 25 ng/mL Cannabinoid, urine                 Cutoff 50 ng/mL Barbiturates + metabolites, urine  Cutoff 200 ng/mL Benzodiazepine, urine              Cutoff 200 ng/mL Methadone, urine  Cutoff 300 ng/mL  The urine drug screen provides only a preliminary, unconfirmed analytical test result and should not be used for non-medical purposes. Clinical consideration and professional judgment should be applied to any positive drug screen result due to possible interfering substances. A more specific alternate chemical method must be used in order to obtain a confirmed analytical result. Gas chromatography / mass spectrometry (GC/MS) is the preferred confirm atory method. Performed at [facility] [address] [place], [place] [address]     Current Facility-Administered Medications  Medication Dose Route Frequency  Provider Last Rate Last Admin   LORazepam (ATIVAN) injection 0-4 mg  0-4 mg Intravenous Q6H Irean Hong, MD   2 mg at 06/11/21 0236   Or   LORazepam (ATIVAN) tablet 0-4 mg  0-4 mg Oral Q6H Irean Hong, MD       thiamine tablet 100 mg  100 mg Oral Daily Irean Hong, MD       Or   thiamine (B-1) injection 100 mg  100 mg Intravenous Daily Irean Hong, MD       Current Outpatient Medications  Medication Sig Dispense Refill   acetaminophen (TYLENOL) 500 MG tablet Take 2 tablets (1,000 mg total) by mouth every 8 (eight) hours. 90 tablet 2   Cholecalciferol (VITAMIN D3) 50 MCG (2000 UT) CAPS Take by mouth daily.     Multiple Vitamins-Minerals (MULTIVITAMIN MEN PO) Take by mouth daily.     omeprazole (PRILOSEC) 20 MG capsule Take 20 mg by mouth daily.     ondansetron (ZOFRAN ODT) 4 MG disintegrating tablet Take 1 tablet (4 mg total) by mouth every 8 (eight) hours as needed for nausea or vomiting. 20 tablet 0   oxycodone (OXY-IR) 5 MG capsule Take 5 mg by mouth every 4 (four) hours as needed.     oxyCODONE (ROXICODONE) 5 MG immediate release tablet Take 1-2 tablets (5-10 mg total) by mouth every 4 (four) hours as needed (pain). 30 tablet 0    Musculoskeletal: Strength & Muscle Tone: within normal limits Gait & Station: normal Patient leans: N/A  Psychiatric Specialty Exam:  Presentation  General Appearance: Appropriate for Environment  Eye Contact:Good  Speech:Clear and Coherent  Speech Volume:Normal  Handedness:Right   Mood and Affect  Mood:Depressed; Hopeless; Anxious  Affect:Blunt; Congruent; Depressed; Flat; Tearful   Thought Process  Thought Processes:Coherent  Descriptions of Associations:Intact  Orientation:Full (Time, Place and Person)  Thought Content:Logical  History of Schizophrenia/Schizoaffective disorder:No  Duration of Psychotic Symptoms:No data recorded Hallucinations:Hallucinations: None  Ideas of Reference:None  Suicidal Thoughts:Suicidal  Thoughts: No  Homicidal Thoughts:Homicidal Thoughts: No   Sensorium  Memory:Immediate Good; Recent Good; Remote Good  Judgment:Fair  Insight:Fair   Executive Functions  Concentration:Good  Attention Span:Good  Recall:Good  Fund of Knowledge:Good  Language:Good   Psychomotor Activity  Psychomotor Activity:Psychomotor Activity: Normal   Assets  Assets:Desire for Improvement; Intimacy; Leisure Time; Physical Health; Resilience; Social Support   Sleep  Sleep:Sleep: Poor   Physical Exam: Physical Exam Vitals and nursing note reviewed.  Constitutional:      Appearance: Normal appearance. He is obese.  HENT:     Head: Normocephalic and atraumatic.     Right Ear: External ear normal.     Left Ear: External ear normal.     Nose: Nose normal.  Cardiovascular:     Rate and Rhythm: Tachycardia present.  Pulmonary:     Effort: Pulmonary effort is normal.  Musculoskeletal:  General: Normal range of motion.     Cervical back: Normal range of motion and neck supple.  Neurological:     General: No focal deficit present.     Mental Status: He is alert and oriented to person, place, and time. Mental status is at baseline.  Psychiatric:        Attention and Perception: Attention and perception normal.        Mood and Affect: Mood is anxious and depressed. Affect is blunt, flat and tearful.        Speech: Speech is delayed and slurred.        Behavior: Behavior normal.        Thought Content: Thought content normal.        Cognition and Memory: Cognition and memory normal.        Judgment: Judgment is impulsive.   Review of Systems  Psychiatric/Behavioral:  Positive for depression and substance abuse. The patient has insomnia.   All other systems reviewed and are negative. Blood pressure (!) 171/108, pulse (!) 117, temperature 98.9 F (37.2 C), temperature source Oral, resp. rate 17, height 5\' 8"  (1.727 m), weight 104.3 kg, SpO2 96 %. Body mass index is 34.97  kg/m.  Treatment Plan Summary: Plan the patient remained under observation overnight and would be discharged in the a.m. once he is sober.   Disposition: No evidence of imminent risk to self or others at present.   Patient does not meet criteria for psychiatric inpatient admission. Supportive therapy provided about ongoing stressors. Discussed crisis plan, support from social network, calling 911, coming to the Emergency Department, and calling Suicide Hotline.  , NP 06/11/2021 4:05 AM

## 2021-06-11 NOTE — BH Assessment (Signed)
Comprehensive Clinical Assessment (CCA) Note  06/11/2021 Chad Walsh [id number] Recommendations for Services/Supports/Treatments: Consulted with Madaline Brilliant., NP, who determined pt. does not meet inpatient criteria and be discharged when medically sober. NP recommends pt's immediate follow up with outpatient resources. Notified Dr. Dolores Frame and Bella Kennedy, RN of disposition recommendation.   Chad Walsh is a 40 year old, English speaking, white male with a history of depression and alcohol abuse. Pt presented to [facility] ED voluntarily due to SI, secondary to alcohol intoxication. The pt admitted that he relapsed about a week ago due to the upcoming anniversary for his mother's death in Jul 27, 2020. The reported that he has attempted substance abuse treatment in the past and explained that he is connected to [facility]. The pt denied withdrawal symptoms and was impaired upon interview. The pt had good insight admitting that he needs help working through grief emotions that contribute to his relapse. Pt reported that he has an upcoming grief counseling appointment with hospice in the next day or two. Pt was anxious, yet cooperative. Of note pt became emotional when discussing his mother's death. Pt did not appear to be responding to internal/external stimuli. Pt had slurred speech and thoughts were relevant to the situation. Pt was oriented x4. Pt presented with a depressed mood; affect was congruent. Pt had a neglected appearance. Pt's BAL was <386. UDS was unremarkable. Pt denied current SI/HI/AV/H.   Chief Complaint:  Chief Complaint  Patient presents with   Suicidal   Alcohol Intoxication   Visit Diagnosis: Alcohol abuse, severe Complicated grief    CCA Screening, Triage and Referral (STR)  Patient Reported Information How did you hear about [place]? Self  Referral name: No data recorded Referral phone number: No data recorded  Whom do you see for routine medical problems? Other  (Comment)  Practice/Facility Name: No data recorded Practice/Facility Phone Number: No data recorded Name of Contact: No data recorded Contact Number: No data recorded Contact Fax Number: No data recorded Prescriber Name: No data recorded Prescriber Address (if known): No data recorded  What Is the Reason for Your Visit/Call Today? SI, secondary to alcohol intoxication  How Long Has This Been Causing You Problems? <Week  What Do You Feel Would Help You the Most Today? Alcohol or Drug Use Treatment; Treatment for Depression or other mood problem   Have You Recently Been in Any Inpatient Treatment (Hospital/Detox/Crisis Center/28-Day Program)? No  Name/Location of Program/Hospital:No data recorded How Long Were You There? No data recorded When Were You Discharged? No data recorded  Have You Ever Received Services From [facility] Before? No  Who Do You See at [facility]? No data recorded  Have You Recently Had Any Thoughts About Hurting Yourself? No  Are You Planning to Commit Suicide/Harm Yourself At This time? No   Have you Recently Had Thoughts About Hurting Someone Chad Walsh? No  Explanation: No data recorded  Have You Used Any Alcohol or Drugs in the Past 24 Hours? Yes  How Long Ago Did You Use Drugs or Alcohol? No data recorded What Did You Use and How Much? Alcohol; unknown amounts   Do You Currently Have a Therapist/Psychiatrist? Yes  Name of Therapist/Psychiatrist: Pt reported that he has a grief counselor that he's suppossed to begin services with in 1-2 days.   Have You Been Recently Discharged From Any Office Practice or Programs? No  Explanation of Discharge From Practice/Program: No data recorded    CCA Screening Triage Referral Assessment Type of Contact: Face-to-Face  Is this Initial  or Reassessment? No data recorded Date Telepsych consult ordered in [facility]:  08/01/20  Time Telepsych consult ordered in [facility]:  0407   Patient Reported Information  Reviewed? Yes  Patient Left Without Being Seen? No data recorded Reason for Not Completing Assessment: No data recorded  Collateral Involvement: None provided   Does Patient Have a Court Appointed Legal Guardian? No data recorded Name and Contact of Legal Guardian: Self  If Minor and Not Living with Parent(s), Who has Custody? No data recorded Is CPS involved or ever been involved? Never  Is APS involved or ever been involved? Never   Patient Determined To Be At Risk for Harm To Self or Others Based on Review of Patient Reported Information or Presenting Complaint? No  Method: No data recorded Availability of Means: No data recorded Intent: No data recorded Notification Required: No data recorded Additional Information for Danger to Others Potential: No data recorded Additional Comments for Danger to Others Potential: No data recorded Are There Guns or Other Weapons in Your Home? No data recorded Types of Guns/Weapons: No data recorded Are These Weapons Safely Secured?                            No data recorded Who Could Verify You Are Able To Have These Secured: No data recorded Do You Have any Outstanding Charges, Pending Court Dates, Parole/Probation? No data recorded Contacted To Inform of Risk of Harm To Self or Others: No data recorded  Location of Assessment: [facility] ED   Does Patient Present under Involuntary Commitment? No  IVC Papers Initial File Date: No data recorded  [place] of Residence: [place]   Patient Currently Receiving the Following Services: SAIOP (Substance Abuse Intensive Outpatient Program   Determination of Need: Emergent (2 hours)   Options For Referral: Intensive Outpatient Therapy; Chemical Dependency Intensive Outpatient Therapy (CDIOP)     CCA Biopsychosocial Intake/Chief Complaint:  Patient is presenting under the influence of alcohol with depression  Current Symptoms/Problems: Patient is presenting under the influence of alcohol  with depression, patient now requesting detox   Patient Reported Schizophrenia/Schizoaffective Diagnosis in Past: No   Strengths: Patient is able to communicate  Preferences: Unknown  Abilities: Patient is able to communicate   Type of Services Patient Feels are Needed: Unknown   Initial Clinical Notes/Concerns: None   Mental Health Symptoms Depression:   Change in energy/activity; Hopelessness; Tearfulness   Duration of Depressive symptoms:  Greater than two weeks   Mania:   None   Anxiety:    Worrying; Tension   Psychosis:   None   Duration of Psychotic symptoms: No data recorded  Trauma:   None   Obsessions:   None   Compulsions:   None   Inattention:   None   Hyperactivity/Impulsivity:   N/A   Oppositional/Defiant Behaviors:   None   Emotional Irregularity:   None   Other Mood/Personality Symptoms:  No data recorded   Mental Status Exam Appearance and self-care  Stature:   Average   Weight:   Overweight   Clothing:   Disheveled   Grooming:   Neglected   Cosmetic use:   None   Posture/gait:   Normal   Motor activity:   Not Remarkable   Sensorium  Attention:   Normal   Concentration:   Anxiety interferes   Orientation:   Person; Situation; Place; Object   Recall/memory:   Normal   Affect and Mood  Affect:  Depressed   Mood:   Depressed   Relating  Eye contact:   Fleeting   Facial expression:   Depressed   Attitude toward examiner:   Cooperative   Thought and Language  Speech flow:  Slurred; Slow   Thought content:   Appropriate to Mood and Circumstances   Preoccupation:   Obsessions   Hallucinations:   None   Organization:  No data recorded  [employer] of Knowledge:   Average   Intelligence:   Average   Abstraction:   Normal   Judgement:   Impaired   Reality Testing:   Adequate   Insight:   Present   Decision Making:   Normal   Social Functioning   Social Maturity:   Isolates   Social Judgement:   Normal   Stress  Stressors:   Grief/losses   Coping Ability:   Contractor Deficits:   None   Supports:   Support needed     Religion: Religion/Spirituality Are You A Religious Person?: No  Leisure/Recreation: Leisure / Recreation Do You Have Hobbies?: No  Exercise/Diet: Exercise/Diet Do You Exercise?: No Have You Gained or Lost A Significant Amount of Weight in the Past Six Months?: No Do You Follow a Special Diet?: No Do You Have Any Trouble Sleeping?: No   CCA Employment/Education Employment/Work Situation: Employment / Work Situation Employment Situation: Unemployed Patient's Job has Been Impacted by Current Illness: No Has Patient ever Been in Equities trader?: No  Education: Education Is Patient Currently Attending School?: No Did Theme park manager?: No Did You Have An Individualized Education Program (IIEP): No Did You Have Any Difficulty At [employer]?: No Patient's Education Has Been Impacted by Current Illness: No   CCA Family/Childhood History Family and Relationship History: Family history Marital status: Single Does patient have children?: No  Childhood History:  Childhood History By whom was/is the patient raised?: Mother Did patient suffer any verbal/emotional/physical/sexual abuse as a child?: No Did patient suffer from severe childhood neglect?: No Has patient ever been sexually abused/assaulted/raped as an adolescent or adult?: No Was the patient ever a victim of a crime or a disaster?: No Witnessed domestic violence?: No Has patient been affected by domestic violence as an adult?: No  Child/Adolescent Assessment:     CCA Substance Use Alcohol/Drug Use: Alcohol / Drug Use Pain Medications: See MAR Prescriptions: See MAR Over the Counter: See MAR History of alcohol / drug use?: Yes Longest period of sobriety (when/how long): Unable to quantify Negative Consequences of  Use: Financial, Personal relationships, Work / School Withdrawal Symptoms: Sweats, Fever / Chills                         ASAM's:  Six Dimensions of Multidimensional Assessment  Dimension 1:  Acute Intoxication and/or Withdrawal Potential:   Dimension 1:  Description of individual's past and current experiences of substance use and withdrawal: Pt has a hx of chronic alcohol abuse  Dimension 2:  Biomedical Conditions and Complications:      Dimension 3:  Emotional, Behavioral, or Cognitive Conditions and Complications:     Dimension 4:  Readiness to Change:     Dimension 5:  Relapse, Continued use, or Continued Problem Potential:     Dimension 6:  Recovery/Living Environment:     ASAM Severity Score: ASAM's Severity Rating Score: 14  ASAM Recommended Level of Treatment: ASAM Recommended Level of Treatment: Level I Outpatient Treatment   Substance use Disorder (  SUD) Substance Use Disorder (SUD)  Checklist Symptoms of Substance Use: Continued use despite having a persistent/recurrent physical/psychological problem caused/exacerbated by use, Evidence of tolerance, Evidence of withdrawal (Comment), Large amounts of time spent to obtain, use or recover from the substance(s), Continued use despite persistent or recurrent social, interpersonal problems, caused or exacerbated by use, Persistent desire or unsuccessful efforts to cut down or control use, Repeated use in physically hazardous situations, Recurrent use that results in a failure to fulfill major role obligations (work, school, home), Presence of craving or strong urge to use, Social, occupational, recreational activities given up or reduced due to use, Substance(s) often taken in larger amounts or over longer times than was intended  Recommendations for Services/Supports/Treatments: Recommendations for Services/Supports/Treatments Recommendations For Services/Supports/Treatments: IOP (Intensive Outpatient Program)  DSM5  Diagnoses: Patient Active Problem List   Diagnosis Date Noted   Closed left ankle fracture 09/16/2020   Transaminitis 09/16/2020   Hypokalemia 09/16/2020   Subdural hematoma, post-traumatic 09/16/2020   Thrombocytopenia (HCC) 09/16/2020   Alcohol abuse 08/01/2020   Depression 08/01/2020   Grieving 08/01/2020   Gastric reflux 08/01/2020   Vitamin D deficiency 08/01/2020    Chad Walsh, LCAS

## 2021-11-12 ENCOUNTER — Inpatient Hospital Stay
Admission: EM | Admit: 2021-11-12 | Discharge: 2021-11-15 | DRG: 432 | Disposition: A | Discharge status: Home / Self Care | Payer: Self-pay | Attending: Internal Medicine | Admitting: Internal Medicine

## 2021-11-12 ENCOUNTER — Other Ambulatory Visit: Payer: Self-pay

## 2021-11-12 DIAGNOSIS — E871 Hypo-osmolality and hyponatremia: Secondary | ICD-10-CM

## 2021-11-12 DIAGNOSIS — K709 Alcoholic liver disease, unspecified: Secondary | ICD-10-CM | POA: Diagnosis present

## 2021-11-12 DIAGNOSIS — F419 Anxiety disorder, unspecified: Secondary | ICD-10-CM | POA: Diagnosis present

## 2021-11-12 DIAGNOSIS — K760 Fatty (change of) liver, not elsewhere classified: Secondary | ICD-10-CM | POA: Diagnosis present

## 2021-11-12 DIAGNOSIS — E669 Obesity, unspecified: Secondary | ICD-10-CM | POA: Diagnosis present

## 2021-11-12 DIAGNOSIS — I8510 Secondary esophageal varices without bleeding: Secondary | ICD-10-CM | POA: Diagnosis present

## 2021-11-12 DIAGNOSIS — I8511 Secondary esophageal varices with bleeding: Secondary | ICD-10-CM | POA: Diagnosis present

## 2021-11-12 DIAGNOSIS — K922 Gastrointestinal hemorrhage, unspecified: Principal | ICD-10-CM | POA: Diagnosis present

## 2021-11-12 DIAGNOSIS — K7030 Alcoholic cirrhosis of liver without ascites: Principal | ICD-10-CM | POA: Diagnosis present

## 2021-11-12 DIAGNOSIS — R7401 Elevation of levels of liver transaminase levels: Secondary | ICD-10-CM | POA: Diagnosis present

## 2021-11-12 DIAGNOSIS — K3189 Other diseases of stomach and duodenum: Secondary | ICD-10-CM | POA: Diagnosis present

## 2021-11-12 DIAGNOSIS — D62 Acute posthemorrhagic anemia: Secondary | ICD-10-CM | POA: Diagnosis present

## 2021-11-12 DIAGNOSIS — Z6838 Body mass index (BMI) 38.0-38.9, adult: Secondary | ICD-10-CM

## 2021-11-12 DIAGNOSIS — F1010 Alcohol abuse, uncomplicated: Secondary | ICD-10-CM | POA: Diagnosis present

## 2021-11-12 DIAGNOSIS — E8729 Other acidosis: Secondary | ICD-10-CM | POA: Diagnosis present

## 2021-11-12 DIAGNOSIS — K7010 Alcoholic hepatitis without ascites: Secondary | ICD-10-CM | POA: Diagnosis present

## 2021-11-12 DIAGNOSIS — Z79899 Other long term (current) drug therapy: Secondary | ICD-10-CM

## 2021-11-12 DIAGNOSIS — F32A Depression, unspecified: Secondary | ICD-10-CM | POA: Diagnosis present

## 2021-11-12 DIAGNOSIS — Z87891 Personal history of nicotine dependence: Secondary | ICD-10-CM

## 2021-11-12 DIAGNOSIS — K219 Gastro-esophageal reflux disease without esophagitis: Secondary | ICD-10-CM | POA: Diagnosis present

## 2021-11-12 LAB — COMPREHENSIVE METABOLIC PANEL
ALT: 150 U/L — ABNORMAL HIGH (ref 0–44)
AST: 317 U/L — ABNORMAL HIGH (ref 15–41)
Albumin: 3.4 g/dL — ABNORMAL LOW (ref 3.5–5.0)
Alkaline Phosphatase: 134 U/L — ABNORMAL HIGH (ref 38–126)
Anion gap: 23 — ABNORMAL HIGH (ref 5–15)
BUN: 18 mg/dL (ref 6–20)
CO2: 22 mmol/L (ref 22–32)
Calcium: 9.3 mg/dL (ref 8.9–10.3)
Chloride: 84 mmol/L — ABNORMAL LOW (ref 98–111)
Creatinine, Ser: 0.91 mg/dL (ref 0.61–1.24)
GFR, Estimated: >60 mL/min (ref >60)
Glucose, Bld: 154 mg/dL — ABNORMAL HIGH (ref 70–99)
Potassium: 3.8 mmol/L (ref 3.5–5.1)
Sodium: 129 mmol/L — ABNORMAL LOW (ref 135–145)
Total Bilirubin: 8.3 mg/dL — ABNORMAL HIGH (ref 0.3–1.2)
Total Protein: 7.2 g/dL (ref 6.5–8.1)

## 2021-11-12 LAB — CBC
HCT: 33.6 % — ABNORMAL LOW (ref 39.0–52.0)
Hemoglobin: 10.6 g/dL — ABNORMAL LOW (ref 13.0–17.0)
MCH: 29.6 pg (ref 26.0–34.0)
MCHC: 31.5 g/dL (ref 30.0–36.0)
MCV: 93.9 fL (ref 80.0–100.0)
Platelets: 285 10*3/uL (ref 150–400)
RBC: 3.58 MIL/uL — ABNORMAL LOW (ref 4.22–5.81)
RDW: 17.6 % — ABNORMAL HIGH (ref 11.5–15.5)
WBC: 14.7 10*3/uL — ABNORMAL HIGH (ref 4.0–10.5)
nRBC: 0.1 % (ref 0.0–0.2)

## 2021-11-12 LAB — PROTIME-INR
INR: 1.4 — ABNORMAL HIGH (ref 0.8–1.2)
Prothrombin Time: 17.6 seconds — ABNORMAL HIGH (ref 11.4–15.2)

## 2021-11-12 LAB — HIV ANTIBODY (ROUTINE TESTING W REFLEX): HIV Screen 4th Generation wRfx: NONREACTIVE

## 2021-11-12 LAB — HEMOGLOBIN AND HEMATOCRIT, BLOOD
HCT: 23.4 % — ABNORMAL LOW (ref 39.0–52.0)
Hemoglobin: 7.7 g/dL — ABNORMAL LOW (ref 13.0–17.0)

## 2021-11-12 LAB — LIPASE, BLOOD: Lipase: 31 U/L (ref 11–51)

## 2021-11-12 LAB — GLUCOSE, CAPILLARY: Glucose-Capillary: 116 mg/dL — ABNORMAL HIGH (ref 70–99)

## 2021-11-12 LAB — MRSA NEXT GEN BY PCR, NASAL: MRSA by PCR Next Gen: NOT DETECTED

## 2021-11-12 LAB — APTT: aPTT: 29 seconds (ref 24–36)

## 2021-11-12 MED ORDER — SODIUM CHLORIDE 0.9 % IV SOLN
50.0000 ug/h | INTRAVENOUS | Status: AC
  Administered 2021-11-12 – 2021-11-15 (×7): 50 ug/h via INTRAVENOUS
  Filled 2021-11-12 (×10): qty 1

## 2021-11-12 MED ORDER — THIAMINE HCL 100 MG PO TABS
100.0000 mg | ORAL_TABLET | Freq: Every day | ORAL | Status: DC
  Administered 2021-11-14: 100 mg via ORAL
  Filled 2021-11-12 (×2): qty 1

## 2021-11-12 MED ORDER — PANTOPRAZOLE SODIUM 40 MG IV SOLR: 40.0000 mg | Freq: Two times a day (BID) | INTRAVENOUS | Status: DC

## 2021-11-12 MED ORDER — LACTATED RINGERS IV BOLUS
1000.0000 mL | Freq: Once | INTRAVENOUS | Status: AC
  Administered 2021-11-12: 1000 mL via INTRAVENOUS

## 2021-11-12 MED ORDER — PANTOPRAZOLE INFUSION (NEW) - SIMPLE MED
8.0000 mg/h | INTRAVENOUS | Status: AC
  Administered 2021-11-12 – 2021-11-15 (×7): 8 mg/h via INTRAVENOUS
  Filled 2021-11-12 (×9): qty 100

## 2021-11-12 MED ORDER — ONDANSETRON HCL 4 MG/2ML IJ SOLN
4.0000 mg | Freq: Once | INTRAMUSCULAR | Status: AC
  Administered 2021-11-12: 4 mg via INTRAVENOUS
  Filled 2021-11-12: qty 2

## 2021-11-12 MED ORDER — ONDANSETRON HCL 4 MG PO TABS: 4.0000 mg | ORAL_TABLET | Freq: Four times a day (QID) | ORAL | Status: DC | PRN

## 2021-11-12 MED ORDER — LIDOCAINE 5 % EX PTCH
2.0000 | MEDICATED_PATCH | CUTANEOUS | Status: DC
  Administered 2021-11-12: 2 via TRANSDERMAL
  Filled 2021-11-12 (×4): qty 2

## 2021-11-12 MED ORDER — LORAZEPAM 2 MG/ML IJ SOLN
2.0000 mg | Freq: Once | INTRAMUSCULAR | Status: AC
  Administered 2021-11-12: 2 mg via INTRAVENOUS
  Filled 2021-11-12: qty 1

## 2021-11-12 MED ORDER — PREDNISONE 20 MG PO TABS
40.0000 mg | ORAL_TABLET | Freq: Every day | ORAL | Status: DC
  Administered 2021-11-12 – 2021-11-15 (×4): 40 mg via ORAL
  Filled 2021-11-12 (×4): qty 2

## 2021-11-12 MED ORDER — LORAZEPAM 2 MG PO TABS: 0.0000 mg | ORAL_TABLET | Freq: Two times a day (BID) | ORAL | Status: DC

## 2021-11-12 MED ORDER — TRANEXAMIC ACID-NACL 1000-0.7 MG/100ML-% IV SOLN
1000.0000 mg | Freq: Once | INTRAVENOUS | Status: AC
  Administered 2021-11-12: 1000 mg via INTRAVENOUS
  Filled 2021-11-12: qty 100

## 2021-11-12 MED ORDER — LORAZEPAM 2 MG PO TABS
0.0000 mg | ORAL_TABLET | Freq: Four times a day (QID) | ORAL | Status: AC
  Administered 2021-11-12: 2 mg via ORAL
  Filled 2021-11-12: qty 1

## 2021-11-12 MED ORDER — LIDOCAINE 5 % EX PTCH: 1.0000 | MEDICATED_PATCH | CUTANEOUS | Status: DC

## 2021-11-12 MED ORDER — DEXTROSE 5 % IN LACTATED RINGERS IV BOLUS
1000.0000 mL | Freq: Once | INTRAVENOUS | Status: AC
  Administered 2021-11-12: 1000 mL via INTRAVENOUS
  Filled 2021-11-12: qty 1000

## 2021-11-12 MED ORDER — SODIUM CHLORIDE 0.9 % IV SOLN: INTRAVENOUS | Status: DC

## 2021-11-12 MED ORDER — OCTREOTIDE LOAD VIA INFUSION
50.0000 ug | Freq: Once | INTRAVENOUS | Status: AC
  Administered 2021-11-12: 50 ug via INTRAVENOUS
  Filled 2021-11-12: qty 25

## 2021-11-12 MED ORDER — THIAMINE HCL 100 MG/ML IJ SOLN
100.0000 mg | Freq: Every day | INTRAMUSCULAR | Status: DC
  Administered 2021-11-12 – 2021-11-15 (×3): 100 mg via INTRAVENOUS
  Filled 2021-11-12 (×3): qty 2

## 2021-11-12 MED ORDER — LORAZEPAM 2 MG/ML IJ SOLN
0.0000 mg | Freq: Two times a day (BID) | INTRAMUSCULAR | Status: DC
  Administered 2021-11-14: 2 mg via INTRAVENOUS
  Filled 2021-11-12: qty 1

## 2021-11-12 MED ORDER — ONDANSETRON HCL 4 MG/2ML IJ SOLN: 4.0000 mg | Freq: Four times a day (QID) | INTRAMUSCULAR | Status: DC | PRN

## 2021-11-12 MED ORDER — MORPHINE SULFATE (PF) 2 MG/ML IV SOLN
2.0000 mg | Freq: Once | INTRAVENOUS | Status: AC
  Administered 2021-11-12: 2 mg via INTRAVENOUS
  Filled 2021-11-12: qty 1

## 2021-11-12 MED ORDER — THIAMINE HCL 100 MG/ML IJ SOLN
Freq: Once | INTRAVENOUS | Status: AC
  Filled 2021-11-12: qty 1000

## 2021-11-12 MED ORDER — LORAZEPAM 2 MG/ML IJ SOLN: 0.0000 mg | Freq: Four times a day (QID) | INTRAMUSCULAR | Status: AC

## 2021-11-12 MED ORDER — PANTOPRAZOLE 80MG IVPB - SIMPLE MED
80.0000 mg | Freq: Once | INTRAVENOUS | Status: AC
  Administered 2021-11-12: 80 mg via INTRAVENOUS
  Filled 2021-11-12: qty 100

## 2021-11-12 MED ORDER — SODIUM CHLORIDE 0.9 % IV SOLN
1.0000 g | Freq: Once | INTRAVENOUS | Status: AC
  Administered 2021-11-12: 1 g via INTRAVENOUS
  Filled 2021-11-12: qty 10

## 2021-11-12 MED ORDER — CHLORHEXIDINE GLUCONATE CLOTH 2 % EX PADS
6.0000 | MEDICATED_PAD | Freq: Every day | CUTANEOUS | Status: DC
Start: 2021-11-13 — End: 2021-11-15
  Administered 2021-11-12 – 2021-11-15 (×3): 6 via TOPICAL

## 2021-11-12 NOTE — ED Notes (Signed)
Admitting MD at bedside.

## 2021-11-12 NOTE — Assessment & Plan Note (Addendum)
As evidenced by marked transaminitis, elevated total bilirubin and elevated INR. ?Concern for possible alcoholic hepatitis ?Patient has been advised abstain from further alcohol use ?Patient's Madrey discriminant function is 34.1, he was started on prednisone. ?GI is recommending to calculate Lille score on day 5, if improving then they will continue prednisone course. ?-Continue prednisone at 40 mg daily. ?-Continue with thiamine and folic acid ? ?

## 2021-11-12 NOTE — Consult Note (Addendum)
? ? ? ?Chad Darby, MD ?[address]  ?[address]  ?[place], [place] [address]  ?Main: [phone]  ?Fax: [phone] ?Pager: [phone] ? ? Consultation ? ?Referring Provider:     No ref. provider found ?Primary Care Physician:  Patient, No Pcp Per (Inactive) ?Primary Gastroenterologist: Althia Forts     ?Reason for Consultation:    Coffee-ground emesis, hematochezia, acute blood loss anemia ? ?Date of Admission:  11/12/2021 ?Date of Consultation:  11/12/2021 ?       ? HPI:   ?Chad Walsh is a 41 y.o. male history of alcohol abuse, who has been drinking heavily for past 4 weeks, has started vomiting coffee-ground material, 2 episodes last night and passing black stool initially followed by bright red blood, he had an episode of syncope prior to arrival to ER.  Patient has history of alcohol withdrawal, he was diaphoretic in the ER, tachycardia with heart rate of 133, labs revealed ?Abnormal LFTs AST 317, ALT 150, alkaline phosphatase 134, total bilirubin 8.3, BUN/creatinine 18/0.91, hyponatremia serum sodium 129, his hemoglobin dropped from 15.15 months ago to 10.6, mild leukocytosis, MCV 93.9, platelets 285, PT/INR 17.6/1.4.  Patient received tranexamic acid, started on octreotide drip, pantoprazole drip and GI is consulted for further management.  Patient is admitted to ICU , currently on CIWA protocol ?Patient denies any abdominal pain.  Patient was sober for more than 100 days within last 1 year, relapsed.  He does states that this is the worst relapse he had ? ?NSAIDs: None ? ?Antiplts/Anticoagulants/Anti thrombotics: None ? ?GI Procedures: None ? ?Past Medical History:  ?Diagnosis Date  ? Anxiety   ? Depression   ? ETOH abuse   ? GERD (gastroesophageal reflux disease)   ? ? ?Past Surgical History:  ?Procedure Laterality Date  ? ORIF ANKLE FRACTURE Left 09/27/2020  ? Procedure: OPEN REDUCTION INTERNAL FIXATION (ORIF) ANKLE FRACTURE, SYNDOSIS REPAIR;  Surgeon: Leim Fabry, MD;  Location: [facility] ORS;   Service: Orthopedics;  Laterality: Left;  ? TONSILLECTOMY    ? WISDOM TOOTH EXTRACTION    ? ? ?Prior to Admission medications   ?Medication Sig Start Date End Date Taking? Authorizing Provider  ?Cholecalciferol (VITAMIN D3) 50 MCG (2000 UT) CAPS Take by mouth daily. ?Patient not taking: Reported on 06/11/2021    [provider]  ?escitalopram (LEXAPRO) 10 MG tablet Take 1 tablet (10 mg total) by mouth daily. 06/11/21 08/10/21  Clapacs, Madie Reno, MD  ?Multiple Vitamins-Minerals (MULTIVITAMIN MEN PO) Take by mouth daily. ?Patient not taking: Reported on 06/11/2021    [provider]  ?omeprazole (PRILOSEC) 20 MG capsule Take 20 mg by mouth daily. ?Patient not taking: Reported on 06/11/2021    [provider]  ?ondansetron (ZOFRAN ODT) 4 MG disintegrating tablet Take 1 tablet (4 mg total) by mouth every 8 (eight) hours as needed for nausea or vomiting. ?Patient not taking: Reported on 06/11/2021 09/27/20   Leim Fabry, MD  ?oxycodone (OXY-IR) 5 MG capsule Take 5 mg by mouth every 4 (four) hours as needed. ?Patient not taking: Reported on 06/11/2021    [provider]  ? ? ?Current Facility-Administered Medications:  ?  [START ON 11/13/2021] Chlorhexidine Gluconate Cloth 2 % PADS 6 each, 6 each, Topical, Q0600, Agbata, Tochukwu, MD, 6 each at 11/12/21 1603 ?  LORazepam (ATIVAN) injection 0-4 mg, 0-4 mg, Intravenous, Q6H **OR** LORazepam (ATIVAN) tablet 0-4 mg, 0-4 mg, Oral, Q6H, Agbata, Tochukwu, MD ?  [START ON 11/14/2021] LORazepam (ATIVAN) injection 0-4 mg, 0-4 mg, Intravenous,  Q12H **OR** [START ON 11/14/2021] LORazepam (ATIVAN) tablet 0-4 mg, 0-4 mg, Oral, Q12H, Agbata, Tochukwu, MD ?  [COMPLETED] octreotide (SANDOSTATIN) 2 mcg/mL load via infusion 50 mcg, 50 mcg, Intravenous, Once, 50 mcg at 11/12/21 1452 **AND** octreotide (SANDOSTATIN) 500 mcg in sodium chloride 0.9 % 250 mL (2 mcg/mL) infusion, 50 mcg/hr, Intravenous, Continuous, Agbata, Tochukwu, MD, Last Rate: 25 mL/hr at  11/12/21 1451, 50 mcg/hr at 11/12/21 1451 ?  ondansetron (ZOFRAN) tablet 4 mg, 4 mg, Oral, Q6H PRN **OR** ondansetron (ZOFRAN) injection 4 mg, 4 mg, Intravenous, Q6H PRN, Agbata, Tochukwu, MD ?  [START ON 11/16/2021] pantoprazole (PROTONIX) injection 40 mg, 40 mg, Intravenous, Q12H, Agbata, Tochukwu, MD ?  pantoprozole (PROTONIX) 80 mg /NS 100 mL infusion, 8 mg/hr, Intravenous, Continuous, Agbata, Tochukwu, MD, Last Rate: 10 mL/hr at 11/12/21 1508, 8 mg/hr at 11/12/21 1508 ?  thiamine tablet 100 mg, 100 mg, Oral, Daily **OR** thiamine (B-1) injection 100 mg, 100 mg, Intravenous, Daily, Agbata, Tochukwu, MD, 100 mg at 11/12/21 1531 ? ?Family History  ?Problem Relation Age of Onset  ? Lung cancer Mother   ?  ? ?Social History  ? ?Tobacco Use  ? Smoking status: Former  ?  Packs/day: 0.50  ?  Years: 18.00  ?  Pack years: 9.00  ?  Types: Cigarettes  ?  Quit date: 03/25/2020  ?  Years since quitting: 1.6  ? Smokeless tobacco: Never  ?Vaping Use  ? Vaping Use: Never used  ?Substance Use Topics  ? Alcohol use: Yes  ?  Alcohol/week: 28.0 standard drinks  ?  Types: 28 Standard drinks or equivalent per week  ?  Comment: 09/25/20 - pt reports no alcohol last 2 weeks  ? Drug use: Not Currently  ?  Types: Marijuana  ?  Comment: early 2021 last used  ? ? ?Allergies as of 11/12/2021  ? (No Known Allergies)  ? ? ?Review of Systems:    ?All systems reviewed and negative except where noted in HPI. ? ? Physical Exam:  ?Vital signs in last 24 hours: ?Temp:  [98.2 ?F (36.8 ?C)-98.7 ?F (37.1 ?C)] 98.2 ?F (36.8 ?C) (03/21 1600) ?Pulse Rate:  [109-130] 118 (03/21 1600) ?Resp:  [20-30] 20 (03/21 1600) ?BP: (93-140)/(65-92) 93/69 (03/21 1600) ?SpO2:  [94 %-100 %] 96 % (03/21 1600) ?Weight:  [99.8 kg-113.4 kg] 113.4 kg (03/21 1600) ?  ?General:   Pleasant, cooperative in NAD ?Head:  Normocephalic and atraumatic. ?Eyes:  Deep icterus.   Conjunctiva pink. PERRLA. ?Ears:  Normal auditory acuity. ?Neck:  Supple; no masses or thyroidomegaly ?Lungs:  Respirations even and unlabored. Lungs clear to auscultation bilaterally.   No wheezes, crackles, or rhonchi.  ?Heart:  Regular rate and rhythm;  Without murmur, clicks, rubs or gallops ?Abdomen:  Soft, nondistended, nontender. Normal bowel sounds. No appreciable masses or hepatomegaly.  No rebound or guarding.  ?Rectal:  Not performed. ?Msk:  Symmetrical without gross deformities.  Strength generalized weakness ?Extremities:  Without edema, cyanosis or clubbing. ?Neurologic:  Alert and oriented x3;  grossly normal neurologically. ?Skin:  Intact without significant lesions or rashes. ?Psych:  Alert and cooperative. Normal affect. ? ?LAB RESULTS: ?CBC Latest Ref Rng & Units 11/12/2021 06/11/2021 04/15/2021  ?WBC 4.0 - 10.5 K/uL 14.7(H) 11.0(H) 12.7(H)  ?Hemoglobin 13.0 - 17.0 g/dL 10.6(L) 15.1 13.9  ?Hematocrit 39.0 - 52.0 % 33.6(L) 42.3 39.2  ?Platelets 150 - 400 K/uL 285 323 277  ? ? ?BMET ?BMP Latest Ref Rng & Units 11/12/2021 06/11/2021 04/15/2021  ?Glucose 70 -  99 mg/dL 154(H) 155(H) 142(H)  ?BUN 6 - 20 mg/dL '18 11 10  ' ?Creatinine 0.61 - 1.24 mg/dL 0.91 0.90 0.86  ?Sodium 135 - 145 mmol/L 129(L) 141 138  ?Potassium 3.5 - 5.1 mmol/L 3.8 3.4(L) 3.5  ?Chloride 98 - 111 mmol/L 84(L) 103 100  ?CO2 22 - 32 mmol/L '22 25 25  ' ?Calcium 8.9 - 10.3 mg/dL 9.3 8.6(L) 9.1  ? ? ?LFT ?Hepatic Function Latest Ref Rng & Units 11/12/2021 06/11/2021 04/15/2021  ?Total Protein 6.5 - 8.1 g/dL 7.2 8.1 7.9  ?Albumin 3.5 - 5.0 g/dL 3.4(L) 4.6 4.7  ?AST 15 - 41 U/L 317(H) 56(H) 65(H)  ?ALT 0 - 44 U/L 150(H) 61(H) 47(H)  ?Alk Phosphatase 38 - 126 U/L 134(H) 111 87  ?Total Bilirubin 0.3 - 1.2 mg/dL 8.3(H) 1.3(H) 1.5(H)  ? ? ? ?STUDIES: ?No results found. ? ? ? Impression / Plan:  ? ?YANIXAN MELLINGER is a 41 y.o. male with history of alcohol abuse, depression is admitted with 1 day history of coffee-ground emesis, hematochezia resulting in acute blood loss anemia ? ?Coffee-ground emesis, hematochezia: Secondary to alcohol abuse, last episode of  coffee-ground emesis was last night ?Maintain 2 large-bore IVs ?Agree with pantoprazole and octreotide drips ?Monitor CBC closely to maintain hemoglobin above 7, platelets above 50 ?Okay with full liquid diet

## 2021-11-12 NOTE — Assessment & Plan Note (Signed)
Hold Lexapro for now.

## 2021-11-12 NOTE — ED Triage Notes (Signed)
Pt arrives via ems from home, pt states that he is an alcholic and has been drinking heavily for the past 4 weeks, but has been weaning down for the past 2 days, pt states last pm he started vomiting coagulated blood and had 2 episodes,pt states that is also passing blood in his stool, pt states that he passed out prior to calling ems pt is visibly diaphoretic in triage with a heart rate of 130 ?

## 2021-11-12 NOTE — ED Triage Notes (Signed)
First Nurse: EMS brought pt for passing blood in stool and vomit this am, began having this 2 weeks ago. CBG 199 ? ?B/p 127/66 ?99% RA  ?133 HR ?18 G LAC got 4mg  of Zofran ?

## 2021-11-12 NOTE — Assessment & Plan Note (Addendum)
Patient admits to alcohol abuse and has symptoms of alcohol withdrawal when he does not drink. ?He remains at high risk for developing symptoms of alcohol withdrawal, currently stable with CIWA score of 2 ?Place patient on banana bag with MVI/thiamine/folic acid. ?-Continue with CIWA protocol ? ?

## 2021-11-12 NOTE — H&P (Addendum)
?History and Physical  ? ? ?Patient: Chad Walsh IDP:[id number] DOB: 1981/05/12 ?DOA: 11/12/2021 ?DOS: the patient was seen and examined on 11/12/2021 ?PCP: Patient, No Pcp Per (Inactive)  ?Patient coming from: Home ? ?Chief Complaint:  ?Chief Complaint  ?Patient presents with  ? GI Bleeding  ? Loss of Consciousness  ? Emesis  ? Withdrawal  ? ?HPI: Chad Walsh is a 41 y.o. male with medical history significant for alcohol abuse, anxiety, depression and GERD who presents to the emergency room via EMS for evaluation of 2 episodes of hematemesis as well as passage of melena stools and hematochezia. ?Patient states that he has been drinking daily for the last 4 weeks but started cutting down on his alcohol use.  He admits to having symptoms of alcohol withdrawal when he does not drink.  He states that he started dry heaving the day prior to his admission and subsequently had 2 episodes of coffee-ground emesis with some blood clots.  On the day of admission he had several episodes of initially dark bloody stools and then bright red.  ?He denies having any abdominal pain and has never had episodes like this in the past.  He admits to occasional NSAID use for bursitis in his hip as well as ankle pain. ?He complained of feeling dizzy and lightheaded but denies any loss of consciousness or falls. ?He denies having any fever, no chills, no cough, no urinary symptoms, no leg swelling, no headache, no blurred vision or any focal deficits. ?Patient was noted to be diaphoretic when he arrived to the ER and was tachycardic with heart rate of 133 bpm. ?Labs reviewed show a 5 g drop in his H&H ?He received a dose of tranexamic acid, IV octreotide, IV Protonix and a dose of Rocephin 2 g ?He will be admitted to the hospital for further evaluation. ?Review of Systems: As mentioned in the history of present illness. All other systems reviewed and are negative. ?Past Medical History:  ?Diagnosis Date  ? Anxiety   ?  Depression   ? ETOH abuse   ? GERD (gastroesophageal reflux disease)   ? ?Past Surgical History:  ?Procedure Laterality Date  ? ORIF ANKLE FRACTURE Left 09/27/2020  ? Procedure: OPEN REDUCTION INTERNAL FIXATION (ORIF) ANKLE FRACTURE, SYNDOSIS REPAIR;  Surgeon: Signa Kell, MD;  Location: [facility] ORS;  Service: Orthopedics;  Laterality: Left;  ? TONSILLECTOMY    ? WISDOM TOOTH EXTRACTION    ? ?Social History:  reports that he quit smoking about 19 months ago. His smoking use included cigarettes. He has a 9.00 pack-year smoking history. He has never used smokeless tobacco. He reports current alcohol use of about 28.0 standard drinks per week. He reports that he does not currently use drugs after having used the following drugs: Marijuana. ? ?No Known Allergies ? ?Family History  ?Problem Relation Age of Onset  ? Lung cancer Mother   ? ? ?Prior to Admission medications   ?Medication Sig Start Date End Date Taking? Authorizing Provider  ?Cholecalciferol (VITAMIN D3) 50 MCG (2000 UT) CAPS Take by mouth daily. ?Patient not taking: Reported on 06/11/2021    [provider]  ?escitalopram (LEXAPRO) 10 MG tablet Take 1 tablet (10 mg total) by mouth daily. 06/11/21 08/10/21  Clapacs, Jackquline Denmark, MD  ?Multiple Vitamins-Minerals (MULTIVITAMIN MEN PO) Take by mouth daily. ?Patient not taking: Reported on 06/11/2021    [provider]  ?omeprazole (PRILOSEC) 20 MG capsule Take 20 mg by mouth daily. ?Patient not taking: Reported  on 06/11/2021    [provider]  ?ondansetron (ZOFRAN ODT) 4 MG disintegrating tablet Take 1 tablet (4 mg total) by mouth every 8 (eight) hours as needed for nausea or vomiting. ?Patient not taking: Reported on 06/11/2021 09/27/20   Signa KellPatel, Sunny, MD  ?oxycodone (OXY-IR) 5 MG capsule Take 5 mg by mouth every 4 (four) hours as needed. ?Patient not taking: Reported on 06/11/2021    [provider]  ? ? ?Physical Exam: ?Vitals:  ? 11/12/21 1510 11/12/21 1600 11/12/21 1615 11/12/21  1630  ?BP: (!) 140/92 93/69 100/63 105/64  ?Pulse: (!) 118 (!) 118 (!) 123 (!) 124  ?Resp:  20  (!) 22  ?Temp:  98.2 ?F (36.8 ?C)    ?TempSrc:  Oral    ?SpO2:  96% 97% 99%  ?Weight:  113.4 kg    ?Height:  5\' 8"  (1.727 m)    ? ?Physical Exam ?Vitals and nursing note reviewed.  ?Constitutional:   ?   Appearance: He is obese.  ?   Comments: Appears anxious  ?HENT:  ?   Head: Normocephalic and atraumatic.  ?   Nose: Nose normal.  ?   Mouth/Throat:  ?   Mouth: Mucous membranes are moist.  ?Eyes:  ?   Pupils: Pupils are equal, round, and reactive to light.  ?Cardiovascular:  ?   Rate and Rhythm: Tachycardia present.  ?Pulmonary:  ?   Effort: Pulmonary effort is normal.  ?   Breath sounds: Normal breath sounds.  ?Abdominal:  ?   General: Abdomen is flat.  ?   Palpations: Abdomen is soft.  ?   Comments: Central adiposity.  Nontender  ?Musculoskeletal:     ?   General: Normal range of motion.  ?   Cervical back: Normal range of motion and neck supple.  ?Skin: ?   General: Skin is warm and dry.  ?Neurological:  ?   General: No focal deficit present.  ?   Mental Status: He is alert and oriented to person, place, and time.  ?   Comments: Has tremors at rest  ?Psychiatric:  ?   Comments: Anxious  ? ? ?Data Reviewed: ?Relevant notes from primary care and specialist visits, past discharge summaries as available in EHR, including Care Everywhere. ?Prior diagnostic testing as pertinent to current admission diagnoses ?Updated medications and problem lists for reconciliation ?ED course, including vitals, labs, imaging, treatment and response to treatment ?Triage notes, nursing and pharmacy notes and ED provider's notes ?Notable results as noted in HPI ?Labs reviewed and essentially negative except for sodium of 129, chloride 84, albumin 3.4, AST 317, ALT 150, alkaline phosphatase 134, total bilirubin 8.3, lipase 31, PT 17.6, INR 1.4, white count 14.7, hemoglobin 10.6, hematocrit 33.6, platelet count 285 ?Twelve-lead EKG reviewed by me  shows sinus tachycardia ?There are no new results to review at this time. ? ?Assessment and Plan: ?* Acute blood loss anemia (ABLA) ?Secondary to GI blood loss from hematemesis and passage of melena stools. ?Concern for possible variceal bleed versus peptic ulcer disease ?Continue octreotide drip initiated in the ER ?Continue IV Protonix ?Check serial H&H and transfuse as needed ?We will request GI consult for endoscopic evaluation  ? ?Alcoholic liver disease (HCC) ?As evidenced by marked transaminitis, elevated total bilirubin and elevated INR. ?Concern for possible alcoholic hepatitis ?Patient has been advised abstain from further alcohol use ?Patient's Madrey discriminant function is 34.1 ?We will defer initiation of systemic steroids to  gastroenterology ? ?Alcohol abuse ?Patient admits to  alcohol abuse and has symptoms of alcohol withdrawal when he does not drink. ?He remains at high risk for developing symptoms of alcohol withdrawal ?We will place patient on CIWA protocol and administer lorazepam for CIWA score of 8 or greater ?Place patient on banana bag with MVI/thiamine/folic acid ? ?Depression ?Hold Lexapro for now ? ? ? ? ? Advance Care Planning:   Code Status: Full Code  ? ?Consults: Gastroenterology ? ?Family Communication: Greater than 50% of time was spent discussing patient's condition and plan of care with him at the bedside.  All questions and concerns have been addressed.  He verbalizes understanding and agrees with the plan. ? ?Severity of Illness: ?The appropriate patient status for this patient is INPATIENT. Inpatient status is judged to be reasonable and necessary in order to provide the required intensity of service to ensure the patient's safety. The patient's presenting symptoms, physical exam findings, and initial radiographic and laboratory data in the context of their chronic comorbidities is felt to place them at high risk for further clinical deterioration. Furthermore, it is not  anticipated that the patient will be medically stable for discharge from the hospital within 2 midnights of admission.  ? ?* I certify that at the point of admission it is my clinical judgment that the patient will

## 2021-11-12 NOTE — Assessment & Plan Note (Addendum)
Secondary to GI blood loss from hematemesis and passage of melena stools. ?EGD with variceal bleed-banding done. ?Continue octreotide drip-for 72 hours ?Continue IV Protonix infusion for 72 hours. ?2 unit of PRBC and 4 units of FFP ordered. ?Iron studies was negative for any iron deficiency. ?-Monitor hemoglobin ?-Transfuse if below 7  ?

## 2021-11-12 NOTE — ED Notes (Signed)
PT assisted onto bedpan while sitting on edge of bed, pt with one episode of bright red bloody stool. Pt c/o dizziness with sitting. Assisted back to bed where dizziness subsides once laying down. ?

## 2021-11-12 NOTE — ED Provider Notes (Signed)
? ?[facility] ?Provider Note ? ? ? None  ?  (approximate) ? ? ?History  ? ?GI Bleeding, Loss of Consciousness, Emesis, and Withdrawal ? ? ?HPI ? ?Chad Walsh is a 41 y.o. male who presents to the ED for evaluation of GI Bleeding, Loss of Consciousness, Emesis, and Withdrawal ?  ?I reviewed various ED visits seen for alcohol abuse. ? ?Patient presents to the ED from home via EMS for evaluation of GI bleeding.  He was at home by himself.  Reports has not eaten any solid food for 2 or 3 weeks, only drinking.  Denies any other recreational drug use beyond his ethanol. ? ?Patient reports developing melena and hematochezia last night, estimating 10-12 episodes total through this morning.  Reports 2 episodes of coffee-ground emesis this morning.  Due to continued bloody output, primarily via stool, he called 911 for evaluation.  He reports an episode of syncope this morning.  Denies much abdominal pain.  Denies fevers, chest pain, cough or aspiration events. ? ?Physical Exam  ? ?Triage Vital Signs: ?ED Triage Vitals  ?Enc Vitals Group  ?   BP 11/12/21 1350 132/77  ?   Pulse Rate 11/12/21 1350 (!) 130  ?   Resp 11/12/21 1350 20  ?   Temp 11/12/21 1350 98.7 ?F (37.1 ?C)  ?   Temp Source 11/12/21 1350 Oral  ?   SpO2 11/12/21 1350 97 %  ?   Weight 11/12/21 1351 220 lb (99.8 kg)  ?   Height 11/12/21 1351 5\' 8"  (1.727 m)  ?   Head Circumference --   ?   Peak Flow --   ?   Pain Score 11/12/21 1351 0  ?   Pain Loc --   ?   Pain Edu? --   ?   Excl. in GC? --   ? ? ?Most recent vital signs: ?Vitals:  ? 11/12/21 1430 11/12/21 1500  ?BP: 116/65 (!) 140/92  ?Pulse: (!) 114 (!) 109  ?Resp: (!) 24 (!) 22  ?Temp:    ?SpO2: 100% 94%  ? ? ?General: Awake, no distress.  Obese and diaphoretic.  Obviously uncomfortable. ?CV:  Good peripheral perfusion.  Tachycardic and regular ?Resp:  Normal effort.  ?Abd:  Minimal distention is present.  Epigastric and periumbilical tenderness is present without peritoneal  features.  Lower abdomen is benign. ?MSK:  No deformity noted.  ?Neuro:  No focal deficits appreciated. ?Other:   ? ? ?ED Results / Procedures / Treatments  ? ?Labs ?(all labs ordered are listed, but only abnormal results are displayed) ?Labs Reviewed  ?COMPREHENSIVE METABOLIC PANEL - Abnormal; Notable for the following components:  ?    Result Value  ? Sodium 129 (*)   ? Chloride 84 (*)   ? Glucose, Bld 154 (*)   ? Albumin 3.4 (*)   ? AST 317 (*)   ? ALT 150 (*)   ? Alkaline Phosphatase 134 (*)   ? Total Bilirubin 8.3 (*)   ? Anion gap 23 (*)   ? All other components within normal limits  ?CBC - Abnormal; Notable for the following components:  ? WBC 14.7 (*)   ? RBC 3.58 (*)   ? Hemoglobin 10.6 (*)   ? HCT 33.6 (*)   ? RDW 17.6 (*)   ? All other components within normal limits  ?PROTIME-INR - Abnormal; Notable for the following components:  ? Prothrombin Time 17.6 (*)   ? INR 1.4 (*)   ?  All other components within normal limits  ?APTT  ?LIPASE, BLOOD  ?HIV ANTIBODY (ROUTINE TESTING W REFLEX)  ?HEMOGLOBIN AND HEMATOCRIT, BLOOD  ?TYPE AND SCREEN  ? ? ?EKG ?Sinus tachycardia with a rate of 123 bpm.  Normal axis and intervals.  No evidence of acute ischemia. ? ?RADIOLOGY ? ? ?Official radiology report(s): ?No results found. ? ?PROCEDURES and INTERVENTIONS: ? ?.1-3 Lead EKG Interpretation ?Performed by: Delton Prairie, MD ?Authorized by: Delton Prairie, MD  ? ?  Interpretation: abnormal   ?  ECG rate:  120 ?  ECG rate assessment: tachycardic   ?  Rhythm: sinus tachycardia   ?  Ectopy: none   ?  Conduction: normal   ?.Critical Care ?Performed by: Delton Prairie, MD ?Authorized by: Delton Prairie, MD  ? ?Critical care provider statement:  ?  Critical care time (minutes):  30 ?  Critical care time was exclusive of:  Separately billable procedures and treating other patients ?  Critical care was necessary to treat or prevent imminent or life-threatening deterioration of the following conditions:  Circulatory failure and metabolic  crisis ?  Critical care was time spent personally by me on the following activities:  Development of treatment plan with patient or surrogate, discussions with consultants, evaluation of patient's response to treatment, examination of patient, ordering and review of laboratory studies, ordering and review of radiographic studies, ordering and performing treatments and interventions, pulse oximetry, re-evaluation of patient's condition and review of old charts ? ?Medications  ?pantoprozole (PROTONIX) 80 mg /NS 100 mL infusion (8 mg/hr Intravenous New Bag/Given 11/12/21 1508)  ?pantoprazole (PROTONIX) injection 40 mg (has no administration in time range)  ?octreotide (SANDOSTATIN) 2 mcg/mL load via infusion 50 mcg (50 mcg Intravenous Bolus from Bag 11/12/21 1452)  ?  And  ?octreotide (SANDOSTATIN) 500 mcg in sodium chloride 0.9 % 250 mL (2 mcg/mL) infusion (50 mcg/hr Intravenous New Bag/Given 11/12/21 1451)  ?dextrose 5% lactated ringers bolus 1,000 mL (has no administration in time range)  ?LORazepam (ATIVAN) injection 0-4 mg (has no administration in time range)  ?  Or  ?LORazepam (ATIVAN) tablet 0-4 mg (has no administration in time range)  ?LORazepam (ATIVAN) injection 0-4 mg (has no administration in time range)  ?  Or  ?LORazepam (ATIVAN) tablet 0-4 mg (has no administration in time range)  ?thiamine tablet 100 mg (has no administration in time range)  ?  Or  ?thiamine (B-1) injection 100 mg (has no administration in time range)  ?0.9 %  sodium chloride infusion (has no administration in time range)  ?ondansetron (ZOFRAN) tablet 4 mg (has no administration in time range)  ?  Or  ?ondansetron (ZOFRAN) injection 4 mg (has no administration in time range)  ?lactated ringers bolus 1,000 mL (0 mLs Intravenous Stopped 11/12/21 1454)  ?LORazepam (ATIVAN) injection 2 mg (2 mg Intravenous Given 11/12/21 1418)  ?pantoprazole (PROTONIX) 80 mg /NS 100 mL IVPB (80 mg Intravenous New Bag/Given 11/12/21 1438)  ?ondansetron [facility])  injection 4 mg (4 mg Intravenous Given 11/12/21 1412)  ?cefTRIAXone (ROCEPHIN) 1 g in sodium chloride 0.9 % 100 mL IVPB (0 g Intravenous Stopped 11/12/21 1454)  ?tranexamic acid (CYKLOKAPRON) IVPB 1,000 mg (0 mg Intravenous Stopped 11/12/21 1450)  ? ? ? ?IMPRESSION / MDM / ASSESSMENT AND PLAN / ED COURSE  ?I reviewed the triage vital signs and the nursing notes. ? ?41 year old alcoholic presents to the ED with upper GI bleeding, evidence of alcoholic ketoacidosis, requiring medical admission.  He is tachycardic, diaphoretic and does not  look well.  He remains hemodynamically stable without any episodes of emesis or bloody stool here in the ED.  Abdomen is nonperitoneal.  Blood work with evidence of alcoholic ketoacidosis and a five-point hemoglobin drop.  Does not meet threshold for transfusion at this point.  INR is 1.4.  Lipase is normal.  Provided PPI and ceftriaxone for upper GI bleeding.  Provided TXA empirically.  Provided octreotide, though he has no known evidence of varices, just highly likely.  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  Consulted medicine, who agrees to admit. ? ?Clinical Course as of 11/12/21 1509  ?Tue Nov 12, 2021  ?1429 reassessed [DS]  ?[id number] I consult with Dr. Joylene IgoAgbata who agrees to admit to a stepdown bed. [DS]  ?  ?Clinical Course User Index ?[DS] Delton PrairieSmith, Zakkiyya Barno, MD  ? ? ? ?FINAL CLINICAL IMPRESSION(S) / ED DIAGNOSES  ? ?Final diagnoses:  ?Upper GI bleed  ?Alcoholic ketoacidosis  ?Alcohol abuse  ? ? ? ?Rx / DC Orders  ? ?ED Discharge Orders   ? ? None  ? ?  ? ? ? ?Note:  This document was prepared using Dragon voice recognition software and may include unintentional dictation errors. ?  ?Delton PrairieSmith, Leylany Nored, MD ?11/12/21 1511 ? ?

## 2021-11-13 ENCOUNTER — Inpatient Hospital Stay: Payer: Self-pay

## 2021-11-13 ENCOUNTER — Encounter
Admission: EM | Disposition: A | Discharge status: Home / Self Care | Payer: Self-pay | Source: Home / Self Care | Attending: Internal Medicine

## 2021-11-13 ENCOUNTER — Inpatient Hospital Stay: Payer: Self-pay | Admitting: Certified Registered"

## 2021-11-13 ENCOUNTER — Encounter: Payer: Self-pay | Admitting: Internal Medicine

## 2021-11-13 DIAGNOSIS — I8510 Secondary esophageal varices without bleeding: Secondary | ICD-10-CM | POA: Diagnosis present

## 2021-11-13 DIAGNOSIS — K922 Gastrointestinal hemorrhage, unspecified: Principal | ICD-10-CM | POA: Diagnosis present

## 2021-11-13 DIAGNOSIS — E871 Hypo-osmolality and hyponatremia: Secondary | ICD-10-CM

## 2021-11-13 DIAGNOSIS — K7030 Alcoholic cirrhosis of liver without ascites: Secondary | ICD-10-CM | POA: Diagnosis present

## 2021-11-13 HISTORY — PX: ESOPHAGOGASTRODUODENOSCOPY (EGD) WITH PROPOFOL: SHX5813

## 2021-11-13 LAB — BASIC METABOLIC PANEL
Anion gap: 11 (ref 5–15)
BUN: 23 mg/dL — ABNORMAL HIGH (ref 6–20)
CO2: 26 mmol/L (ref 22–32)
Calcium: 8.4 mg/dL — ABNORMAL LOW (ref 8.9–10.3)
Chloride: 93 mmol/L — ABNORMAL LOW (ref 98–111)
Creatinine, Ser: 0.83 mg/dL (ref 0.61–1.24)
GFR, Estimated: >60 mL/min (ref >60)
Glucose, Bld: 155 mg/dL — ABNORMAL HIGH (ref 70–99)
Potassium: 3.9 mmol/L (ref 3.5–5.1)
Sodium: 130 mmol/L — ABNORMAL LOW (ref 135–145)

## 2021-11-13 LAB — CBC
HCT: 23.3 % — ABNORMAL LOW (ref 39.0–52.0)
Hemoglobin: 7.5 g/dL — ABNORMAL LOW (ref 13.0–17.0)
MCH: 30.7 pg (ref 26.0–34.0)
MCHC: 32.2 g/dL (ref 30.0–36.0)
MCV: 95.5 fL (ref 80.0–100.0)
Platelets: 161 10*3/uL (ref 150–400)
RBC: 2.44 MIL/uL — ABNORMAL LOW (ref 4.22–5.81)
RDW: 18.5 % — ABNORMAL HIGH (ref 11.5–15.5)
WBC: 11.7 10*3/uL — ABNORMAL HIGH (ref 4.0–10.5)
nRBC: 0.4 % — ABNORMAL HIGH (ref 0.0–0.2)

## 2021-11-13 LAB — HEPATIC FUNCTION PANEL
ALT: 113 U/L — ABNORMAL HIGH (ref 0–44)
AST: 253 U/L — ABNORMAL HIGH (ref 15–41)
Albumin: 3 g/dL — ABNORMAL LOW (ref 3.5–5.0)
Alkaline Phosphatase: 102 U/L (ref 38–126)
Bilirubin, Direct: 4.5 mg/dL — ABNORMAL HIGH (ref 0.0–0.2)
Indirect Bilirubin: 3.2 mg/dL — ABNORMAL HIGH (ref 0.3–0.9)
Total Bilirubin: 7.7 mg/dL — ABNORMAL HIGH (ref 0.3–1.2)
Total Protein: 5.6 g/dL — ABNORMAL LOW (ref 6.5–8.1)

## 2021-11-13 LAB — HEMOGLOBIN AND HEMATOCRIT, BLOOD
HCT: 24.8 % — ABNORMAL LOW (ref 39.0–52.0)
Hemoglobin: 8.1 g/dL — ABNORMAL LOW (ref 13.0–17.0)

## 2021-11-13 LAB — IRON AND TIBC
Iron: 188 ug/dL — ABNORMAL HIGH (ref 45–182)
Saturation Ratios: 50 % — ABNORMAL HIGH (ref 17.9–39.5)
TIBC: 374 ug/dL (ref 250–450)
UIBC: 186 ug/dL

## 2021-11-13 LAB — RETICULOCYTES
Immature Retic Fract: 35.6 % — ABNORMAL HIGH (ref 2.3–15.9)
RBC.: 3.36 MIL/uL — ABNORMAL LOW (ref 4.22–5.81)
Retic Count, Absolute: 169.7 10*3/uL (ref 19.0–186.0)
Retic Ct Pct: 5.1 % — ABNORMAL HIGH (ref 0.4–3.1)

## 2021-11-13 LAB — CBC WITH DIFFERENTIAL/PLATELET
Abs Immature Granulocytes: 0.2 10*3/uL — ABNORMAL HIGH (ref 0.00–0.07)
Basophils Absolute: 0 10*3/uL (ref 0.0–0.1)
Basophils Relative: 0 %
Eosinophils Absolute: 0 10*3/uL (ref 0.0–0.5)
Eosinophils Relative: 0 %
HCT: 30.4 % — ABNORMAL LOW (ref 39.0–52.0)
Hemoglobin: 10.1 g/dL — ABNORMAL LOW (ref 13.0–17.0)
Immature Granulocytes: 2 %
Lymphocytes Relative: 16 %
Lymphs Abs: 2.2 10*3/uL (ref 0.7–4.0)
MCH: 30.6 pg (ref 26.0–34.0)
MCHC: 33.2 g/dL (ref 30.0–36.0)
MCV: 92.1 fL (ref 80.0–100.0)
Monocytes Absolute: 1.8 10*3/uL — ABNORMAL HIGH (ref 0.1–1.0)
Monocytes Relative: 14 %
Neutro Abs: 9.2 10*3/uL — ABNORMAL HIGH (ref 1.7–7.7)
Neutrophils Relative %: 68 %
Platelets: 181 10*3/uL (ref 150–400)
RBC: 3.3 MIL/uL — ABNORMAL LOW (ref 4.22–5.81)
RDW: 17.4 % — ABNORMAL HIGH (ref 11.5–15.5)
WBC: 13.5 10*3/uL — ABNORMAL HIGH (ref 4.0–10.5)
nRBC: 1 % — ABNORMAL HIGH (ref 0.0–0.2)

## 2021-11-13 LAB — PROTIME-INR
INR: 1.2 (ref 0.8–1.2)
Prothrombin Time: 14.9 seconds (ref 11.4–15.2)

## 2021-11-13 LAB — FOLATE: Folate: 6.6 ng/mL (ref >5.9)

## 2021-11-13 LAB — FERRITIN: Ferritin: 70 ng/mL (ref 24–336)

## 2021-11-13 LAB — ABO/RH: ABO/RH(D): A POS

## 2021-11-13 LAB — APTT: aPTT: 25 seconds (ref 24–36)

## 2021-11-13 LAB — PREPARE RBC (CROSSMATCH)

## 2021-11-13 SURGERY — ESOPHAGOGASTRODUODENOSCOPY (EGD) WITH PROPOFOL
Anesthesia: General

## 2021-11-13 MED ORDER — SODIUM CHLORIDE 0.9 % IV SOLN: 1.0000 g | Freq: Every day | INTRAVENOUS | Status: DC

## 2021-11-13 MED ORDER — MELATONIN 5 MG PO TABS: 2.5000 mg | ORAL_TABLET | Freq: Every evening | ORAL | Status: DC | PRN

## 2021-11-13 MED ORDER — SODIUM CHLORIDE 0.9% IV SOLUTION: Freq: Once | INTRAVENOUS | Status: AC

## 2021-11-13 MED ORDER — PROPOFOL 10 MG/ML IV BOLUS
INTRAVENOUS | Status: DC | PRN
  Administered 2021-11-13: 3 mg via INTRAVENOUS
  Administered 2021-11-13: 30 mg via INTRAVENOUS
  Administered 2021-11-13: 70 mg via INTRAVENOUS

## 2021-11-13 MED ORDER — PROPOFOL 500 MG/50ML IV EMUL
INTRAVENOUS | Status: DC | PRN
  Administered 2021-11-13: 125 ug/kg/min via INTRAVENOUS

## 2021-11-13 MED ORDER — SODIUM CHLORIDE 0.9 % IV SOLN
INTRAVENOUS | Status: DC
  Administered 2021-11-13: 20 mL/h via INTRAVENOUS

## 2021-11-13 MED ORDER — LIDOCAINE HCL (CARDIAC) PF 100 MG/5ML IV SOSY
PREFILLED_SYRINGE | INTRAVENOUS | Status: DC | PRN
  Administered 2021-11-13: 50 mg via INTRAVENOUS

## 2021-11-13 MED ORDER — MORPHINE SULFATE (PF) 2 MG/ML IV SOLN
2.0000 mg | Freq: Once | INTRAVENOUS | Status: AC
  Administered 2021-11-13: 2 mg via INTRAVENOUS
  Filled 2021-11-13: qty 1

## 2021-11-13 MED ORDER — SODIUM CHLORIDE 0.9 % IV SOLN
2.0000 g | INTRAVENOUS | Status: DC
  Administered 2021-11-13 – 2021-11-14 (×2): 2 g via INTRAVENOUS
  Filled 2021-11-13 (×2): qty 20
  Filled 2021-11-13: qty 2
  Filled 2021-11-13: qty 20

## 2021-11-13 MED ORDER — ALUM & MAG HYDROXIDE-SIMETH 200-200-20 MG/5ML PO SUSP
15.0000 mL | Freq: Four times a day (QID) | ORAL | Status: DC | PRN
  Administered 2021-11-13 (×2): 15 mL via ORAL
  Filled 2021-11-13 (×2): qty 30

## 2021-11-13 MED ORDER — PHENYLEPHRINE HCL (PRESSORS) 10 MG/ML IV SOLN
INTRAVENOUS | Status: DC | PRN
  Administered 2021-11-13: 160 ug via INTRAVENOUS
  Administered 2021-11-13: 80 ug via INTRAVENOUS
  Administered 2021-11-13: 160 ug via INTRAVENOUS
  Administered 2021-11-13: 80 ug via INTRAVENOUS

## 2021-11-13 NOTE — Progress Notes (Signed)
PT transport on hospital bed from ICU-9 to Endoscopy holding area. IV drips remained running. Blood consent signed prior to leaving. Need provider signature for procedure consent. Blood products ordered but not available at time of transport. PT to receive blood product upon return to unit. PT tolerated transport very well without incident. PT's phone and charger remained in room with no belongings transported with him. ?

## 2021-11-13 NOTE — Assessment & Plan Note (Signed)
See above under acute blood loss. ?

## 2021-11-13 NOTE — Anesthesia Postprocedure Evaluation (Signed)
Anesthesia Post Note ? ?Patient: Chad Walsh ? ?Procedure(s) Performed: ESOPHAGOGASTRODUODENOSCOPY (EGD) WITH PROPOFOL ? ?Patient location during evaluation: PACU ?Anesthesia Type: General ?Level of consciousness: awake and alert, oriented and patient cooperative ?Pain management: pain level controlled ?Vital Signs Assessment: post-procedure vital signs reviewed and stable ?Respiratory status: spontaneous breathing, nonlabored ventilation and respiratory function stable ?Cardiovascular status: blood pressure returned to baseline and stable ?Postop Assessment: adequate PO intake ?Anesthetic complications: no ? ? ?No notable events documented. ? ? ?Last Vitals:  ?Vitals:  ? 11/13/21 1150 11/13/21 1233  ?BP: (!) 106/50 120/79  ?Pulse: (!) 101 (!) 101  ?Resp: (!) 21 17  ?Temp:  37.2 ?C  ?SpO2: 96% 96%  ?  ?Last Pain:  ?Vitals:  ? 11/13/21 1233  ?TempSrc: Oral  ?PainSc:   ? ? ?  ?  ?  ?  ?  ?  ? ?Reed Breech ? ? ? ? ?

## 2021-11-13 NOTE — Transfer of Care (Signed)
Immediate Anesthesia Transfer of Care Note ? ?Patient: Chad Walsh ? ?Procedure(s) Performed: ESOPHAGOGASTRODUODENOSCOPY (EGD) WITH PROPOFOL ? ?Patient Location: PACU and Endoscopy Unit ? ?Anesthesia Type:General ? ?Level of Consciousness: drowsy ? ?Airway & Oxygen Therapy: Patient Spontanous Breathing and Patient connected to face mask oxygen ? ?Post-op Assessment: Report given to RN ? ?Post vital signs: Reviewed and stable ? ?Last Vitals:  ?Vitals Value Taken Time  ?BP    ?Temp    ?Pulse    ?Resp    ?SpO2    ? ? ?Last Pain:  ?Vitals:  ? 11/13/21 1031  ?TempSrc: Temporal  ?PainSc:   ?   ? ?  ? ?Complications: No notable events documented. ?

## 2021-11-13 NOTE — Progress Notes (Signed)
?Progress Note ? ? ?Patient: Chad Walsh [id number] DOB: 22-Mar-1981 DOA: 11/12/2021     1 ?DOS: the patient was seen and examined on 11/13/2021 ?  ?Brief hospital course: ?Taken from H&P ? ? Chad Walsh is a 41 y.o. male with medical history significant for alcohol abuse, anxiety, depression and GERD who presents to the emergency room via EMS for evaluation of 2 episodes of hematemesis as well as passage of melena stools and hematochezia. ?Patient states that he has been drinking daily for the last 4 weeks but started cutting down on his alcohol use.  He admits to having symptoms of alcohol withdrawal when he does not drink.  He states that he started dry heaving the day prior to his admission and subsequently had 2 episodes of coffee-ground emesis with some blood clots.  On the day of admission he had several episodes of initially dark bloody stools and then bright red blood per rectum. ?Patient denies any abdominal pain.  He uses NSAIDs occasionally for bursitis in his hip as well as ankle pain.  He was also feeling dizzy, no falls. ? ?On arrival to ED he was hemodynamically stable, there is if 5 unit fall in his hemoglobin.  CMP with sodium of 129, chloride 84, albumin 3.4, AST 317, ALT 150, alkaline phosphatase 134, total bilirubin 8.3, INR 1.4, leukocytosis with white cell count of 14.7 and hemoglobin of 10.6 which decreased to 7.7 on repeat CBC. ? ?GI evaluated him.  He was started on octreotide and Protonix infusion for 72 hours.  He was taken for EGD and found to have a bleeding esophageal varices with severe portal hypertensive gastropathy, 3 bands were placed to stop bleeding. ?2 unit of PRBC was ordered this morning. ?4 units of FFP ordered at the advice of GI. ? ?Because of his abnormal liver function and transaminitis, concern of alcoholic hepatitis.   ?Maddrey's discriminant function 34.1, he was started on prednisone 40 mg daily.  GI advised to calculate Lille score on day 5, if  showing improvement, will continue prednisone course. ? ?No prior history of liver cirrhosis but due to EGD findings-ordered liver ultrasound. ? ? ?Assessment and Plan: ?* Acute blood loss anemia (ABLA) ?Secondary to GI blood loss from hematemesis and passage of melena stools. ?EGD with variceal bleed-banding done. ?Continue octreotide drip-for 72 hours ?Continue IV Protonix infusion for 72 hours. ?2 unit of PRBC and 4 units of FFP ordered. ?Iron studies was negative for any iron deficiency. ?-Monitor hemoglobin ?-Transfuse if below 7  ? ?Esophageal varices in alcoholic cirrhosis (HCC) ?No prior diagnosis of liver cirrhosis.  EGD findings are concerning for cirrhosis with portal hypertension and esophageal varices bleed. ?-Liver ultrasound ?-Monitor liver function ? ? ?Upper GI bleed ?See above under acute blood loss. ? ?Alcoholic liver disease ([place]) ?As evidenced by marked transaminitis, elevated total bilirubin and elevated INR. ?Concern for possible alcoholic hepatitis ?Patient has been advised abstain from further alcohol use ?Patient's Madrey discriminant function is 34.1, he was started on prednisone. ?GI is recommending to calculate Lille score on day 5, if improving then they will continue prednisone course. ?-Continue prednisone at 40 mg daily. ?-Continue with thiamine and folic acid ? ? ?Alcohol abuse ?Patient admits to alcohol abuse and has symptoms of alcohol withdrawal when he does not drink. ?He remains at high risk for developing symptoms of alcohol withdrawal, currently stable with CIWA score of 2 ?Place patient on banana bag with MVI/thiamine/folic acid. ?-Continue with CIWA protocol ? ? ?Hyponatremia ?  Most likely secondary to alcohol abuse and liver disease. ?Patient was also on Lexapro which can be contributory. ?-Holding Lexapro ?-Monitor sodium-currently at 130 ? ?Depression ?Hold Lexapro for now ? ?  ? ?Subjective: Patient continued to have melanotic stools, twice since morning.  No more  vomiting.  He was counseled again against alcohol abuse.  He was getting ready to go for EGD. ? ?Physical Exam: ?Vitals:  ? 11/13/21 1429 11/13/21 1444 11/13/21 1446 11/13/21 1500  ?BP: 119/70 115/70 115/70 120/78  ?Pulse: (!) 103 (!) 101 (!) 102 (!) 105  ?Resp: (!) 25 17 19 19   ?Temp:  99.1 ?F (37.3 ?C)    ?TempSrc:  Oral    ?SpO2: 92% 97% 97% 97%  ?Weight:      ?Height:      ? ?General.  Obese gentleman, in no acute distress. ?Pulmonary.  Lungs clear bilaterally, normal respiratory effort. ?CV.  Regular rate and rhythm, no JVD, rub or murmur. ?Abdomen.  Soft, nontender, nondistended, BS positive. ?CNS.  Alert and oriented .  No focal neurologic deficit. ?Extremities.  No edema, no cyanosis, pulses intact and symmetrical. ?Psychiatry.  Judgment and insight appears normal. ? ?Data Reviewed: ?I personally reviewed prior notes, labs and images. ? ?Family Communication: Discussed with patient ? ?Disposition: ?Status is: Inpatient ?Remains inpatient appropriate because: Severity of illness ? ? Planned Discharge Destination: Home ? ?DVT prophylaxis.  SCDs-GI bleed ? ?Time spent: 55 minutes ? ?This record has been created using Systems analyst. Errors have been sought and corrected,but may not always be located. Such creation errors do not reflect on the standard of care. ? ?Author: ?Lorella Nimrod, MD ?11/13/2021 3:20 PM ? ?For on call review [email or web address].  ?

## 2021-11-13 NOTE — Hospital Course (Addendum)
Taken from H&P ? ? Chad Walsh is a 41 y.o. male with medical history significant for alcohol abuse, anxiety, depression and GERD who presents to the emergency room via EMS for evaluation of 2 episodes of hematemesis as well as passage of melena stools and hematochezia. ?Patient states that he has been drinking daily for the last 4 weeks but started cutting down on his alcohol use.  He admits to having symptoms of alcohol withdrawal when he does not drink.  He states that he started dry heaving the day prior to his admission and subsequently had 2 episodes of coffee-ground emesis with some blood clots.  On the day of admission he had several episodes of initially dark bloody stools and then bright red blood per rectum. ?Patient denies any abdominal pain.  He uses NSAIDs occasionally for bursitis in his hip as well as ankle pain.  He was also feeling dizzy, no falls. ? ?On arrival to ED he was hemodynamically stable, there is if 5 unit fall in his hemoglobin.  CMP with sodium of 129, chloride 84, albumin 3.4, AST 317, ALT 150, alkaline phosphatase 134, total bilirubin 8.3, INR 1.4, leukocytosis with white cell count of 14.7 and hemoglobin of 10.6 which decreased to 7.7 on repeat CBC. ? ?GI evaluated him.  He was started on octreotide and Protonix infusion for 72 hours.  He was taken for EGD and found to have a bleeding esophageal varices with severe portal hypertensive gastropathy, 3 bands were placed to stop bleeding. ?2 unit of PRBC was ordered this morning. ?4 units of FFP ordered at the advice of GI. ? ?Because of his abnormal liver function and transaminitis, concern of alcoholic hepatitis.  Hepatitis panel is negative. ?Maddrey's discriminant function 34.1, he was started on prednisone 40 mg daily.  GI advised to calculate Lille score on day 5, if showing improvement, will continue prednisone course. ? ?No prior history of liver cirrhosis but due to EGD findings-ordered liver ultrasound. ? ?3/23: Liver  ultrasound with hepatic steatosis and mild nodularity which can be early cirrhosis. ?Had 1 melanotic stools since this morning, hemoglobin at 8.1. ?GI if to continue octreotide and Protonix infusion for 72 hours, day 2 today. ?They are also recommending to start nadolol on discharge-patient will need a repeat EGD in 4 weeks. ? ?3/24: Hemoglobin remained stable and improving.  Patient was able to tolerate advancement in diet.  No more melanotic stools, had a large normal color bowel movement on the day of discharge.  Received 72 hours of octreotide and Protonix infusion before discharge.  He was also started on nadolol 20 mg daily.  He will continue prednisone 40 mg daily for next 2 weeks until he sees his gastroenterologist for further recommendations. ? ?Patient was again counseled against alcohol abuse. ? ?He will continue current medications and follow-up with his providers. ?

## 2021-11-13 NOTE — Anesthesia Preprocedure Evaluation (Addendum)
Anesthesia Evaluation  ?Patient identified by MRN, date of birth, ID band ?Patient awake ? ? ? ?Reviewed: ?Allergy & Precautions, NPO status , Patient's Chart, lab work & pertinent test results ? ?History of Anesthesia Complications ?Negative for: history of anesthetic complications ? ?Airway ?Mallampati: IV ? ? ?Neck ROM: Full ? ? ? Dental ? ?(+) Poor Dentition ?  ?Pulmonary ?former smoker (quit 2021),  ?  ?Pulmonary exam normal ?breath sounds clear to auscultation ? ? ? ? ? ? Cardiovascular ?Exercise Tolerance: Good ?Normal cardiovascular exam ?Rhythm:Regular Rate:Normal ? ?ECG 11/12/21: Sinus tachycardia (HR 128) ?  ?Neuro/Psych ?PSYCHIATRIC DISORDERS Anxiety Depression Alcohol use disorder, 6-8 16-oz beers daily, attempting to quit ?  ? GI/Hepatic ?GERD  ,  ?Endo/Other  ?Obesity  ? Renal/GU ?negative Renal ROS  ? ?  ?Musculoskeletal ? ? Abdominal ?  ?Peds ? Hematology ? ?(+) Blood dyscrasia, anemia ,   ?Anesthesia Other Findings ? ? Reproductive/Obstetrics ? ?  ? ? ? ? ? ? ? ? ? ? ? ? ? ?  ?  ? ? ? ? ? ? ? ?Anesthesia Physical ?Anesthesia Plan ? ?ASA: 3 ? ?Anesthesia Plan: General  ? ?Post-op Pain Management:   ? ?Induction: Intravenous ? ?PONV Risk Score and Plan: 2 and Propofol infusion, TIVA and Treatment may vary due to age or medical condition ? ?Airway Management Planned: Natural Airway ? ?Additional Equipment:  ? ?Intra-op Plan:  ? ?Post-operative Plan:  ? ?Informed Consent: I have reviewed the patients History and Physical, chart, labs and discussed the procedure including the risks, benefits and alternatives for the proposed anesthesia with the patient or authorized representative who has indicated his/her understanding and acceptance.  ? ? ? ? ? ?Plan Discussed with: CRNA ? ?Anesthesia Plan Comments: (LMA/GETA backup discussed.  Patient consented for risks of anesthesia including but not limited to:  ?- adverse reactions to medications ?- damage to eyes, teeth, lips or  other oral mucosa ?- nerve damage due to positioning  ?- sore throat or hoarseness ?- damage to heart, brain, nerves, lungs, other parts of body or loss of life ? ?Informed patient about role of CRNA in peri- and intra-operative care.  Patient voiced understanding.)  ? ? ? ? ? ? ?Anesthesia Quick Evaluation ? ?

## 2021-11-13 NOTE — Assessment & Plan Note (Signed)
No prior diagnosis of liver cirrhosis.  EGD findings are concerning for cirrhosis with portal hypertension and esophageal varices bleed. ?-Liver ultrasound ?-Monitor liver function ? ?

## 2021-11-13 NOTE — Assessment & Plan Note (Signed)
Most likely secondary to alcohol abuse and liver disease. ?Patient was also on Lexapro which can be contributory. ?-Holding Lexapro ?-Monitor sodium-currently at 130 ?

## 2021-11-13 NOTE — Op Note (Signed)
[facility] ?Gastroenterology ?Patient Name: Chad Walsh ?Procedure Date: 11/13/2021 11:06 AM ?MRN: [id number] ?Account #: [id number] ?Date of Birth: 1980/12/13 ?Admit Type: Outpatient ?Age: 41 ?Room: [facility] ENDO ROOM 4 ?Gender: Male ?Note Status: Finalized ?Instrument Name: Upper Endoscope [id number] ?Procedure:             Upper GI endoscopy ?Indications:           Acute post hemorrhagic anemia, Coffee-ground emesis,  ?                       Hematochezia, Suspected upper gastrointestinal bleeding ?Providers:             Lin Landsman MD, MD ?Medicines:             General Anesthesia ?Complications:         No immediate complications. Estimated blood loss:  ?                       Minimal. ?Procedure:             Pre-Anesthesia Assessment: ?                       - Prior to the procedure, a History and Physical was  ?                       performed, and patient medications and allergies were  ?                       reviewed. The patient is competent. The risks and  ?                       benefits of the procedure and the sedation options and  ?                       risks were discussed with the patient. All questions  ?                       were answered and informed consent was obtained.  ?                       Patient identification and proposed procedure were  ?                       verified by the physician, the nurse, the  ?                       anesthesiologist, the anesthetist and the technician  ?                       in the pre-procedure area in the procedure room in the  ?                       endoscopy suite. Mental Status Examination: alert and  ?                       oriented. Airway Examination: normal oropharyngeal  ?                       airway and neck mobility. Respiratory Examination:  ?  clear to auscultation. CV Examination: normal.  ?                       Prophylactic Antibiotics: The patient does not require  ?                        prophylactic antibiotics. Prior Anticoagulants: The  ?                       patient has taken no previous anticoagulant or  ?                       antiplatelet agents. ASA Grade Assessment: IV - A  ?                       patient with severe systemic disease that is a  ?                       constant threat to life. After reviewing the risks and  ?                       benefits, the patient was deemed in satisfactory  ?                       condition to undergo the procedure. The anesthesia  ?                       plan was to use general anesthesia. Immediately prior  ?                       to administration of medications, the patient was  ?                       re-assessed for adequacy to receive sedatives. The  ?                       heart rate, respiratory rate, oxygen saturations,  ?                       blood pressure, adequacy of pulmonary ventilation, and  ?                       response to care were monitored throughout the  ?                       procedure. The physical status of the patient was  ?                       re-assessed after the procedure. ?                       After obtaining informed consent, the endoscope was  ?                       passed under direct vision. Throughout the procedure,  ?                       the patient's blood pressure, pulse, and oxygen  ?  saturations were monitored continuously. The Endoscope  ?                       was introduced through the mouth, and advanced to the  ?                       second part of duodenum. The upper GI endoscopy was  ?                       accomplished without difficulty. The patient tolerated  ?                       the procedure well. ?Findings: ?     The duodenal bulb and second portion of the duodenum were normal. ?     Severe, diffuse portal hypertensive gastropathy was found in the entire  ?     examined stomach. ?     The cardia and gastric fundus were normal on retroflexion. ?     Four columns  of large (> 5 mm) varices, one varix was actively bleeding  ?     were found in the lower third of the esophagus, at the gastroesophageal  ?     junction and in the cardia, 38 cm from the incisors. Red wale signs were  ?     present. One band was placed on the bleeding varix and the bleeding  ?     immediately stopped, Two bands were successfully placed with incomplete  ?     eradication of varices. Bleeding had stopped at the end of the  ?     procedure. Estimated blood loss was minimal. ?Impression:            - Normal duodenal bulb and second portion of the  ?                       duodenum. ?                       - Portal hypertensive gastropathy. ?                       - Large (> 5 mm) esophageal varices actively bleeding.  ?                       Incompletely eradicated. Banded. ?                       - No specimens collected. ?Recommendation:        - Return patient to ICU for ongoing care. ?                       - NPO today. ?                       - Continue present medications. ?                       - If patient rebleeds evaluate for TIPS ?Procedure Code(s):     --- Professional --- ?                       [phone], Esophagogastroduodenoscopy, flexible,  ?  transoral; with band ligation of esophageal/gastric  ?                       varices ?Diagnosis Code(s):     --- Professional --- ?                       K76.6, Portal hypertension ?                       I85.01, Esophageal varices with bleeding ?                       K31.89, Other diseases of stomach and duodenum ?                       D62, Acute posthemorrhagic anemia ?                       K92.0, Hematemesis ?                       K92.1, Melena (includes Hematochezia) ?CPT copyright 2019 American Medical Association. All rights reserved. ?The codes documented in this report are preliminary and upon coder review may  ?be revised to meet current compliance requirements. ?Dr. Ulyess Mort ?Caeley Dohrmann Raeanne Gathers MD, MD ?11/13/2021  11:38:37 AM ?This report has been signed electronically. ?Number of Addenda: 0 ?Note Initiated On: 11/13/2021 11:06 AM ?Estimated Blood Loss:  Estimated blood loss was minimal. ?     [facility] ?

## 2021-11-14 DIAGNOSIS — K922 Gastrointestinal hemorrhage, unspecified: Secondary | ICD-10-CM

## 2021-11-14 DIAGNOSIS — K709 Alcoholic liver disease, unspecified: Secondary | ICD-10-CM

## 2021-11-14 DIAGNOSIS — I8510 Secondary esophageal varices without bleeding: Secondary | ICD-10-CM

## 2021-11-14 DIAGNOSIS — K7030 Alcoholic cirrhosis of liver without ascites: Secondary | ICD-10-CM

## 2021-11-14 LAB — BPAM RBC
Blood Product Expiration Date: 202304152359
Blood Product Expiration Date: 202304152359
ISSUE DATE / TIME: 202303221226
ISSUE DATE / TIME: 202303221425
Unit Type and Rh: 6200
Unit Type and Rh: 6200

## 2021-11-14 LAB — CBC WITH DIFFERENTIAL/PLATELET
Abs Immature Granulocytes: 0.11 10*3/uL — ABNORMAL HIGH (ref 0.00–0.07)
Basophils Absolute: 0 10*3/uL (ref 0.0–0.1)
Basophils Relative: 1 %
Eosinophils Absolute: 0 10*3/uL (ref 0.0–0.5)
Eosinophils Relative: 0 %
HCT: 24.2 % — ABNORMAL LOW (ref 39.0–52.0)
Hemoglobin: 8.1 g/dL — ABNORMAL LOW (ref 13.0–17.0)
Immature Granulocytes: 1 %
Lymphocytes Relative: 26 %
Lymphs Abs: 2.2 10*3/uL (ref 0.7–4.0)
MCH: 31 pg (ref 26.0–34.0)
MCHC: 33.5 g/dL (ref 30.0–36.0)
MCV: 92.7 fL (ref 80.0–100.0)
Monocytes Absolute: 1 10*3/uL (ref 0.1–1.0)
Monocytes Relative: 12 %
Neutro Abs: 5.1 10*3/uL (ref 1.7–7.7)
Neutrophils Relative %: 60 %
Platelets: 150 10*3/uL (ref 150–400)
RBC: 2.61 MIL/uL — ABNORMAL LOW (ref 4.22–5.81)
RDW: 17.7 % — ABNORMAL HIGH (ref 11.5–15.5)
WBC: 8.5 10*3/uL (ref 4.0–10.5)
nRBC: 1.2 % — ABNORMAL HIGH (ref 0.0–0.2)

## 2021-11-14 LAB — HEMOGLOBIN AND HEMATOCRIT, BLOOD
HCT: 26.8 % — ABNORMAL LOW (ref 39.0–52.0)
HCT: 26.3 % — ABNORMAL LOW (ref 39.0–52.0)
Hemoglobin: 8.9 g/dL — ABNORMAL LOW (ref 13.0–17.0)
Hemoglobin: 8.5 g/dL — ABNORMAL LOW (ref 13.0–17.0)

## 2021-11-14 LAB — COMPREHENSIVE METABOLIC PANEL
ALT: 77 U/L — ABNORMAL HIGH (ref 0–44)
AST: 176 U/L — ABNORMAL HIGH (ref 15–41)
Albumin: 2.9 g/dL — ABNORMAL LOW (ref 3.5–5.0)
Alkaline Phosphatase: 80 U/L (ref 38–126)
Anion gap: 8 (ref 5–15)
BUN: 22 mg/dL — ABNORMAL HIGH (ref 6–20)
CO2: 31 mmol/L (ref 22–32)
Calcium: 8.2 mg/dL — ABNORMAL LOW (ref 8.9–10.3)
Chloride: 97 mmol/L — ABNORMAL LOW (ref 98–111)
Creatinine, Ser: 0.66 mg/dL (ref 0.61–1.24)
GFR, Estimated: >60 mL/min (ref >60)
Glucose, Bld: 83 mg/dL (ref 70–99)
Potassium: 3 mmol/L — ABNORMAL LOW (ref 3.5–5.1)
Sodium: 136 mmol/L (ref 135–145)
Total Bilirubin: 7.2 mg/dL — ABNORMAL HIGH (ref 0.3–1.2)
Total Protein: 5.5 g/dL — ABNORMAL LOW (ref 6.5–8.1)

## 2021-11-14 LAB — PREPARE FRESH FROZEN PLASMA

## 2021-11-14 LAB — BPAM FFP
Blood Product Expiration Date: 202303272359
Blood Product Expiration Date: 202303272359
ISSUE DATE / TIME: 202303221618
ISSUE DATE / TIME: 202303221758
Unit Type and Rh: 6200
Unit Type and Rh: 6200

## 2021-11-14 LAB — TYPE AND SCREEN
ABO/RH(D): A POS
Antibody Screen: NEGATIVE
Unit division: 0
Unit division: 0

## 2021-11-14 LAB — VITAMIN B12: Vitamin B-12: 557 pg/mL (ref 180–914)

## 2021-11-14 LAB — HEPATITIS PANEL, ACUTE
HCV Ab: NONREACTIVE
Hep A IgM: NONREACTIVE
Hep B C IgM: NONREACTIVE
Hepatitis B Surface Ag: NONREACTIVE

## 2021-11-14 LAB — MAGNESIUM: Magnesium: 2.1 mg/dL (ref 1.7–2.4)

## 2021-11-14 MED ORDER — POTASSIUM CHLORIDE 10 MEQ/100ML IV SOLN
10.0000 meq | INTRAVENOUS | Status: AC
  Administered 2021-11-14 (×6): 10 meq via INTRAVENOUS
  Filled 2021-11-14 (×6): qty 100

## 2021-11-14 MED ORDER — FOLIC ACID 1 MG PO TABS
1.0000 mg | ORAL_TABLET | Freq: Every day | ORAL | Status: DC
  Administered 2021-11-14 – 2021-11-15 (×2): 1 mg via ORAL
  Filled 2021-11-14 (×2): qty 1

## 2021-11-14 MED ORDER — SODIUM CHLORIDE 0.9% IV SOLUTION: Freq: Once | INTRAVENOUS | Status: AC

## 2021-11-14 MED ORDER — MORPHINE SULFATE (PF) 2 MG/ML IV SOLN
2.0000 mg | Freq: Once | INTRAVENOUS | Status: AC
  Administered 2021-11-14: 2 mg via INTRAVENOUS
  Filled 2021-11-14: qty 1

## 2021-11-14 MED ORDER — FUROSEMIDE 20 MG PO TABS: 20.0000 mg | ORAL_TABLET | Freq: Every day | ORAL | Status: DC

## 2021-11-14 MED ORDER — SPIRONOLACTONE 25 MG PO TABS: 50.0000 mg | ORAL_TABLET | Freq: Every day | ORAL | Status: DC

## 2021-11-14 MED ORDER — ENSURE ENLIVE PO LIQD
237.0000 mL | Freq: Two times a day (BID) | ORAL | Status: DC
  Administered 2021-11-14: 237 mL via ORAL

## 2021-11-14 NOTE — Plan of Care (Signed)
Nutrition Education Note ? ?RD consulted for nutrition education regarding low sodium diet/ cirrhosis. ? ?Spoke with pt at bedside, who reports feeling better today (watching [employer] [place] [employer] on his phone). Pt reports that he has struggled with his sobriety over the past 1.5 years secondary to his mother's passing, losing his job, and breaking his ankle ("all there was to do was look at the walls and drink"). Pt reports that he has gone through drug and alcohol rehab and has had periods of sobriety and relapse. He shares that he is going to abstain from alcohol due to the impact on his health and this hospitalization.  ? ?Pt shares that he was consuming mostly water and gatorade for the past two weeks. He was slowly decreasing alcohol intake when he started having symptoms. When he is not drinking, he reports good appetite of 2-3 meals per day. Pt shares that he usually goes out to eat at restaurants, but often orders salad and grilled chicken. He also admits to eating cheesesteaks, [place] food, and Ramen Noodles. He does not add salt or seasonings to food. He shares he has some knowledge about low sodium diet, as he has a close friend who also has cirrhosis.  ? ?RD spent most of visit speaking with pt about healthier choices at restaurants. RD also provided reflective listening and emotional support.  ? ?RD provided "Cirrhosis Nutrition Therapy" handout from the Academy of Nutrition and Dietetics. Reviewed patient's dietary recall. Provided examples on ways to decrease sodium intake in diet. Discouraged intake of processed foods and use of salt shaker. Encouraged fresh fruits and vegetables as well as whole grain sources of carbohydrates to maximize fiber intake.  ? ?RD discussed why it is important for patient to adhere to diet recommendations, and emphasized foods to avoid and consuming small, frequent meals in times of early satiety Teach back method used. ? ?Expect fair to good compliance. ? ?Nutrition-Focused  physical exam completed. Findings are no fat depletion, no muscle depletion, and mild edema.   ? ?Case discussed with Dr. Allegra Lai; ok to advance diet to low sodium as pt reports he thinks he can eat solid food.  ? ?Body mass index is 38.01 kg/m?Marland Kitchen Pt meets criteria for obesity, class II based on current BMI. ? ?Current diet order is full liquid, patient is consuming approximately 100% of meals at this time. Labs and medications reviewed. No further nutrition interventions warranted at this time. RD contact information provided. If additional nutrition issues arise, please re-consult RD.  ? ?Levada Schilling, RD, LDN, CDCES ?Registered Dietitian II ?Certified Diabetes Care and Education Specialist ?Please refer to [facility] for RD and/or RD on-call/weekend/after hours pager   ?

## 2021-11-14 NOTE — Assessment & Plan Note (Signed)
No prior diagnosis of liver cirrhosis.  EGD findings are concerning for cirrhosis with portal hypertension and esophageal varices bleed. ?Liver ultrasound with hepatic steatosis and mild nodularity, concerning for early cirrhosis. ?-Monitor liver function ?-Patient will need to be on nadolol on discharge ?-He will also need a repeat EGD in 4 weeks ?-Advance diet to full liquid per GI ? ? ?

## 2021-11-14 NOTE — Assessment & Plan Note (Signed)
As evidenced by marked transaminitis, elevated total bilirubin and elevated INR. ?Concern for possible alcoholic hepatitis ?Patient has been advised abstain from further alcohol use ?Patient's Madrey discriminant function is 34.1, he was started on prednisone. ?GI is recommending to calculate Lille score on day 5, if improving then they will continue prednisone course. ?-Continue prednisone at 40 mg daily. ?-Continue with thiamine and folic acid ? ?

## 2021-11-14 NOTE — Progress Notes (Signed)
-year-old ? ? ?Arlyss Repressohini R Indiana Pechacek, MD ?[address]  ?[address]  ?[place], [place] [address]  ?Main: [phone]  ?Fax: [phone] ?Pager: [phone] ? ? ?Subjective: ?Patient underwent upper endoscopy yesterday with ligation of bleeding esophageal varices.  Patient reported having brown bowel movement yesterday.  This morning he had black tarry stool.  He is sitting up in chair when I saw him in the ICU.  Tolerating clear liquids well.  He did not have any GI complaints today ?Patient received 2 units of PRBCs and 2 units FFP's yesterday ? ?Objective: ?Vital signs in last 24 hours: ?Vitals:  ? 11/14/21 0900 11/14/21 1000 11/14/21 1100 11/14/21 1200  ?BP: 126/83 131/64 (!) 149/91 137/81  ?Pulse: (!) 103 98 (!) 105 95  ?Resp: 20 18 (!) 24 17  ?Temp:      ?TempSrc:      ?SpO2: 94% 97% 94% 92%  ?Weight:      ?Height:      ? ?Weight change:  ? ?Intake/Output Summary (Last 24 hours) at 11/14/2021 1341 ?Last data filed at 11/14/2021 1034 ?Gross per 24 hour  ?Intake 3081.85 ml  ?Output 1775 ml  ?Net 1306.85 ml  ? ? ?Exam: ?Heart:: Tachycardia, regular rhythm ?Lungs: normal and clear to auscultation ?Abdomen: soft, nontender, normal bowel sounds ? ? ?Lab Results: ? ?  Latest Ref Rng & Units 11/14/2021  ?  6:04 AM 11/13/2021  ?  8:45 PM 11/13/2021  ? 12:19 PM  ?CBC  ?WBC 4.0 - 10.5 K/uL 8.5   13.5   11.7    ?Hemoglobin 13.0 - 17.0 g/dL 8.1   25.310.1   7.5    ?Hematocrit 39.0 - 52.0 % 24.2   30.4   23.3    ?Platelets 150 - 400 K/uL 150   181   161    ? ? ?  Latest Ref Rng & Units 11/14/2021  ?  6:04 AM 11/13/2021  ?  3:39 AM 11/12/2021  ?  1:53 PM  ?CMP  ?Glucose 70 - 99 mg/dL 83   664155   [id number]    ?BUN 6 - 20 mg/dL 22   23   18     ?Creatinine 0.61 - 1.24 mg/dL 4.740.66   2.590.83   5.630.91    ?Sodium 135 - 145 mmol/L 136   130   129    ?Potassium 3.5 - 5.1 mmol/L 3.0   3.9   3.8    ?Chloride 98 - 111 mmol/L 97   93   84    ?CO2 22 - 32 mmol/L 31   26   22     ?Calcium 8.9 - 10.3 mg/dL 8.2   8.4   9.3    ?Total Protein 6.5 - 8.1 g/dL 5.5   5.6    7.2    ?Total Bilirubin 0.3 - 1.2 mg/dL 7.2   7.7   8.3    ?Alkaline Phos 38 - 126 U/L 80   102   134    ?AST 15 - 41 U/L 176   253   317    ?ALT 0 - 44 U/L 77   113   150    ? ? ?Micro Results: ?Recent Results (from the past 240 hour(s))  ?MRSA Next Gen by PCR, Nasal     Status: None  ? Collection Time: 11/12/21  4:00 PM  ? Specimen: Nasal Mucosa; Nasal Swab  ?Result Value Ref Range Status  ? MRSA by PCR Next Gen NOT DETECTED NOT DETECTED Final  ?  Comment: (NOTE) ?The GeneXpert MRSA Assay (FDA approved for NASAL specimens only), ?is one component of a comprehensive MRSA colonization surveillance ?program. It is not intended to diagnose MRSA infection nor to guide ?or monitor treatment for MRSA infections. ?Test performance is not FDA approved in patients less than 2 years ?old. ?Performed at [facility] ([place]), [address] [place], ?[place] [address] ?  ? ?Studies/Results: ?US Abdomen Limited RUQ (LIVER/GB) ? ?Result Date: 11/13/2021 ?CLINICAL DATA:  Abnormal liver function test EXAM: ULTRASOUND ABDOMEN LIMITED RIGHT UPPER QUADRANT COMPARISON:  08/01/2020 FINDINGS: Gallbladder: Normal wall thickness measuring 2.5 mm. No Murphy's sign. No definite gallstones. Layering gallbladder sludge as before. No pericholecystic fluid or signs of cholecystitis. Common bile duct: Diameter: 8 mm Liver: Increased echogenicity compatible with hepatic steatosis. Some degree of surface nodularity suggesting cirrhosis as well. No large focal abnormality or intrahepatic biliary dilatation. Portal vein is patent on color Doppler imaging with normal direction of blood flow towards the liver. Other: Included views of the right kidney demonstrate no hydronephrosis. No surrounding free fluid or ascites. IMPRESSION: Gallbladder sludge.  Negative for gallstones or cholecystitis Hepatic steatosis and cirrhosis. Electronically Signed   By: Judie Petit.  Shick M.D.   On: 11/13/2021 16:10   ?Medications: I have reviewed the patient's current  medications. ?Prior to Admission:  ?Medications Prior to Admission  ?Medication Sig Dispense Refill Last Dose  ? Cholecalciferol (VITAMIN D3) 50 MCG (2000 UT) CAPS Take by mouth daily. (Patient not taking: Reported on 06/11/2021)     ? escitalopram (LEXAPRO) 10 MG tablet Take 1 tablet (10 mg total) by mouth daily. 30 tablet 1   ? Multiple Vitamins-Minerals (MULTIVITAMIN MEN PO) Take by mouth daily. (Patient not taking: Reported on 06/11/2021)     ? omeprazole (PRILOSEC) 20 MG capsule Take 20 mg by mouth daily. (Patient not taking: Reported on 06/11/2021)     ? ondansetron (ZOFRAN ODT) 4 MG disintegrating tablet Take 1 tablet (4 mg total) by mouth every 8 (eight) hours as needed for nausea or vomiting. (Patient not taking: Reported on 06/11/2021) 20 tablet 0   ? oxycodone (OXY-IR) 5 MG capsule Take 5 mg by mouth every 4 (four) hours as needed. (Patient not taking: Reported on 06/11/2021)     ? ?Scheduled: ? Chlorhexidine Gluconate Cloth  6 each Topical Q0600  ? feeding supplement  237 mL Oral BID BM  ? folic acid  1 mg Oral Daily  ? lidocaine  2 patch Transdermal Q24H  ? LORazepam  0-4 mg Intravenous Q6H  ? Or  ? LORazepam  0-4 mg Oral Q6H  ? LORazepam  0-4 mg Intravenous Q12H  ? Or  ? LORazepam  0-4 mg Oral Q12H  ? [START ON 11/16/2021] pantoprazole  40 mg Intravenous Q12H  ? predniSONE  40 mg Oral Q breakfast  ? thiamine  100 mg Oral Daily  ? Or  ? thiamine  100 mg Intravenous Daily  ? ?Continuous: ? cefTRIAXone (ROCEPHIN)  IV Stopped (11/13/21 1352)  ? octreotide  (SANDOSTATIN)    IV infusion 50 mcg/hr (11/14/21 0855)  ? pantoprazole 8 mg/hr (11/14/21 0800)  ? potassium chloride 10 mEq (11/14/21 1257)  ? ?GYJ:EHUD & mag hydroxide-simeth, melatonin, ondansetron **OR** ondansetron (ZOFRAN) IV ?Anti-infectives (From admission, onward)  ? ? Start     Dose/Rate Route Frequency Ordered Stop  ? 11/13/21 1400  cefTRIAXone (ROCEPHIN) 2 g in sodium chloride 0.9 % 100 mL IVPB       ? 2 g ?200 mL/hr over 30  Minutes Intravenous  Every 24 hours 11/13/21 1149    ? 11/13/21 1145  cefTRIAXone (ROCEPHIN) 1 g in sodium chloride 0.9 % 100 mL IVPB  Status:  Discontinued       ? 1 g ?200 mL/hr over 30 Minutes Intravenous Daily 11/13/21 1144 11/13/21 1149  ? 11/12/21 1415  cefTRIAXone (ROCEPHIN) 1 g in sodium chloride 0.9 % 100 mL IVPB       ? 1 g ?200 mL/hr over 30 Minutes Intravenous  Once 11/12/21 1411 11/12/21 1454  ? ?  ? ?Scheduled Meds: ? Chlorhexidine Gluconate Cloth  6 each Topical Q0600  ? feeding supplement  237 mL Oral BID BM  ? folic acid  1 mg Oral Daily  ? lidocaine  2 patch Transdermal Q24H  ? LORazepam  0-4 mg Intravenous Q6H  ? Or  ? LORazepam  0-4 mg Oral Q6H  ? LORazepam  0-4 mg Intravenous Q12H  ? Or  ? LORazepam  0-4 mg Oral Q12H  ? [START ON 11/16/2021] pantoprazole  40 mg Intravenous Q12H  ? predniSONE  40 mg Oral Q breakfast  ? thiamine  100 mg Oral Daily  ? Or  ? thiamine  100 mg Intravenous Daily  ? ?Continuous Infusions: ? cefTRIAXone (ROCEPHIN)  IV Stopped (11/13/21 1352)  ? octreotide  (SANDOSTATIN)    IV infusion 50 mcg/hr (11/14/21 0855)  ? pantoprazole 8 mg/hr (11/14/21 0800)  ? potassium chloride 10 mEq (11/14/21 1257)  ? ?PRN Meds:.alum & mag hydroxide-simeth, melatonin, ondansetron **OR** ondansetron (ZOFRAN) IV ? ? ?Assessment: ?Principal Problem: ?  Acute blood loss anemia (ABLA) ?Active Problems: ?  Alcohol abuse ?  Depression ?  Gastric reflux ?  Transaminitis ?  Alcoholic liver disease (HCC) ?  Upper GI bleed ?  Esophageal varices in alcoholic cirrhosis (HCC) ?  Hyponatremia ? ?Plan: ?Acute upper GI bleed secondary to bleeding esophageal varices s/p EGD on 3/22 ligation x3 ?Patient had 1 episode of melena this morning, status post 2 units of PRBCs and 2 units of FFP's ?Hemoglobin 8.1 today, platelets 150, INR 1.2 ?Right upper quadrant ultrasound with Dopplers revealed cirrhosis and no evidence of PVT ?Continue ceftriaxone 2 g daily for SBP prophylaxis ?Continue octreotide drip for 72 hours, day 2 ?Continue  pantoprazole drip for 72 hours, day 2 ?Okay to advance to full liquid diet today ?Patient will need repeat upper endoscopy in 4 weeks for variceal surveillance and possible banding ?Discussed with patient regard

## 2021-11-14 NOTE — Assessment & Plan Note (Signed)
See above under acute blood loss. ?

## 2021-11-14 NOTE — Progress Notes (Signed)
?Progress Note ? ? ?Patient: Chad Walsh TMA:[id number] DOB: 1981/06/08 DOA: 11/12/2021     2 ?DOS: the patient was seen and examined on 11/14/2021 ?  ?Brief hospital course: ?Taken from H&P ? ? Chad Walsh is a 41 y.o. male with medical history significant for alcohol abuse, anxiety, depression and GERD who presents to the emergency room via EMS for evaluation of 2 episodes of hematemesis as well as passage of melena stools and hematochezia. ?Patient states that he has been drinking daily for the last 4 weeks but started cutting down on his alcohol use.  He admits to having symptoms of alcohol withdrawal when he does not drink.  He states that he started dry heaving the day prior to his admission and subsequently had 2 episodes of coffee-ground emesis with some blood clots.  On the day of admission he had several episodes of initially dark bloody stools and then bright red blood per rectum. ?Patient denies any abdominal pain.  He uses NSAIDs occasionally for bursitis in his hip as well as ankle pain.  He was also feeling dizzy, no falls. ? ?On arrival to ED he was hemodynamically stable, there is if 5 unit fall in his hemoglobin.  CMP with sodium of 129, chloride 84, albumin 3.4, AST 317, ALT 150, alkaline phosphatase 134, total bilirubin 8.3, INR 1.4, leukocytosis with white cell count of 14.7 and hemoglobin of 10.6 which decreased to 7.7 on repeat CBC. ? ?GI evaluated him.  He was started on octreotide and Protonix infusion for 72 hours.  He was taken for EGD and found to have a bleeding esophageal varices with severe portal hypertensive gastropathy, 3 bands were placed to stop bleeding. ?2 unit of PRBC was ordered this morning. ?4 units of FFP ordered at the advice of GI. ? ?Because of his abnormal liver function and transaminitis, concern of alcoholic hepatitis.   ?Maddrey's discriminant function 34.1, he was started on prednisone 40 mg daily.  GI advised to calculate Lille score on day 5, if  showing improvement, will continue prednisone course. ? ?No prior history of liver cirrhosis but due to EGD findings-ordered liver ultrasound. ? ?3/23: Liver ultrasound with hepatic steatosis and mild nodularity which can be early cirrhosis. ?Had 1 melanotic stools since this morning, hemoglobin at 8.1. ?GI if to continue octreotide and Protonix infusion for 72 hours, day 2 today. ?They are also recommending to start nadolol on discharge-patient will need a repeat EGD in 4 weeks. ?Advancing diet to full liquid today. ? ? ?Assessment and Plan: ?* Acute blood loss anemia (ABLA) ?Secondary to GI blood loss from hematemesis and passage of melena stools. ?EGD with variceal bleed-banding done. ?Continue octreotide drip-for 72 hours, day 2 today ?Continue IV Protonix infusion for 72 hours, day 2 today ?S/p 2 unit of PRBC and 2 units of FFP. ?Iron studies was negative for any iron deficiency. ?-Monitor hemoglobin ?-Transfuse if below 7  ? ? ?Esophageal varices in alcoholic cirrhosis (HCC) ?No prior diagnosis of liver cirrhosis.  EGD findings are concerning for cirrhosis with portal hypertension and esophageal varices bleed. ?Liver ultrasound with hepatic steatosis and mild nodularity, concerning for early cirrhosis. ?-Monitor liver function ?-Patient will need to be on nadolol on discharge ?-He will also need a repeat EGD in 4 weeks ?-Advance diet to full liquid per GI ? ? ? ?Upper GI bleed ?See above under acute blood loss. ? ?Alcoholic liver disease (HCC) ?As evidenced by marked transaminitis, elevated total bilirubin and elevated INR. ?Concern for  possible alcoholic hepatitis ?Patient has been advised abstain from further alcohol use ?Patient's Madrey discriminant function is 34.1, he was started on prednisone. ?GI is recommending to calculate Lille score on day 5, if improving then they will continue prednisone course. ?-Continue prednisone at 40 mg daily. ?-Continue with thiamine and folic acid ? ? ?Alcohol  abuse ?Patient admits to alcohol abuse and has symptoms of alcohol withdrawal when he does not drink. ?He remains at high risk for developing symptoms of alcohol withdrawal, currently stable with CIWA score of 2 ?Place patient on banana bag with MVI/thiamine/folic acid. ?-Continue with CIWA protocol ? ? ?Hyponatremia ?Most likely secondary to alcohol abuse and liver disease. ?Patient was also on Lexapro which can be contributory. ?-Holding Lexapro ?-Monitor sodium-currently at 130 ? ?Depression ?Hold Lexapro for now ? ?  ? ?Subjective: Patient was sitting in chair comfortably when seen today.  Had 1 melanotic stool since morning.  Denies any abdominal pain.  No nausea or vomiting.  Per patient he will quit alcohol at this time. ? ?Physical Exam: ?Vitals:  ? 11/14/21 1100 11/14/21 1200 11/14/21 1300 11/14/21 1413  ?BP: (!) 149/91 137/81 137/86 (!) 154/93  ?Pulse: (!) 105 95 92 96  ?Resp: (!) 24 17  19   ?Temp:    98.6 ?F (37 ?C)  ?TempSrc:      ?SpO2: 94% 92%  93%  ?Weight:      ?Height:      ? ?General.  Well-developed, obese gentleman, in no acute distress. ?Pulmonary.  Lungs clear bilaterally, normal respiratory effort. ?CV.  Regular rate and rhythm, no JVD, rub or murmur. ?Abdomen.  Soft, nontender, nondistended, BS positive. ?CNS.  Alert and oriented x3.  No focal neurologic deficit. ?Extremities.  No edema, no cyanosis, pulses intact and symmetrical. ?Psychiatry.  Judgment and insight appears normal. ? ?Data Reviewed: ?Prior notes, labs and images reviewed ? ?Family Communication: Discussed with patient ? ?Disposition: ?Status is: Inpatient ?Remains inpatient appropriate because: Severity of illness ? ? Planned Discharge Destination: Home ? ?Time spent: 45 minutes ? ?This record has been created using . Errors have been sought and corrected,but may not always be located. Such creation errors do not reflect on the standard of care. ? ?Author: ?Conservation officer, historic buildings, MD ?11/14/2021 3:35  PM ? ?For on call review [email or web address].  ?

## 2021-11-14 NOTE — Plan of Care (Signed)

## 2021-11-14 NOTE — Progress Notes (Signed)
Pt had [place] stools this am, MD made aware, no new orders, will continue to monitor. ?

## 2021-11-14 NOTE — TOC Initial Note (Signed)
Transition of Care (TOC) - Initial/Assessment Note  ? ? ?Patient Details  ?Name: Chad Walsh ?MRN: [id number] ?Date of Birth: 1980/12/27 ? ?Transition of Care (TOC) CM/SW Contact:    ?Shelbie Hutching, RN ?Phone Number: ?11/14/2021, 11:14 AM ? ?Clinical Narrative:                 ?Patient admitted to the hospital with GI bleed, patient has a history of alcoholism and has been drinking heavily for the past few weeks.  RNCM met with patient at the bedside, introduced self and explained role. ?Patient reports that he lives alone in [place], he does not currently work, his father and his sister both live in [place] area, he speaks more with the father than sister. ?Patient reports that he is done drinking alcohol, he has been able to quit in the past for about one hundred days and then something happens and he slips up.  He goes to [facility] and reports that he knows what he needs to do he just has to do it. ?Patient does not have a PCP and does not have any insurance.  RNCM will provide patient with information on sliding scale clinics and [facility].  He can get his prescriptions filled at [facility] at discharge.   ? ?TOC will cont to follow.   ? ?Expected Discharge Plan: Home/Self Care ?Barriers to Discharge: Continued Medical Work up ? ? ?Patient Goals and CMS Choice ?Patient states their goals for this hospitalization and ongoing recovery are:: will stop drinking for good ?  ?  ? ?Expected Discharge Plan and Services ?Expected Discharge Plan: Home/Self Care ?  ?Discharge Planning Services: CM Consult ?  ?Living arrangements for the past 2 months: [place] ?                ?DME Arranged: N/A ?DME Agency: NA ?  ?  ?  ?HH Arranged: NA ?[place] Agency: NA ?  ?  ?  ? ?Prior Living Arrangements/Services ?Living arrangements for the past 2 months: [place] ?Lives with:: Self ?Patient language and need for interpreter reviewed:: Yes ?Do you feel safe going back to the place where you  live?: Yes      ?Need for Family Participation in Patient Care: Yes (Comment) ?Care giver support system in place?: Yes (comment) (father and sister) ?  ?Criminal Activity/Legal Involvement Pertinent to Current Situation/Hospitalization: No - Comment as needed ? ?Activities of Daily Living ?Home Assistive Devices/Equipment: None ?ADL Screening (condition at time of admission) ?Patient's cognitive ability adequate to safely complete daily activities?: Yes ?Is the patient deaf or have difficulty hearing?: No ?Does the patient have difficulty seeing, even when wearing glasses/contacts?: No ?Does the patient have difficulty concentrating, remembering, or making decisions?: No ?Patient able to express need for assistance with ADLs?: Yes ?Does the patient have difficulty dressing or bathing?: No ?Independently performs ADLs?: Yes (appropriate for developmental age) ?Does the patient have difficulty walking or climbing stairs?: No ?Weakness of Legs: None ?Weakness of Arms/Hands: None ? ?Permission Sought/Granted ?Permission sought to share information with : Case Manager, Family Supports ?Permission granted to share information with : Yes, Verbal Permission Granted ? Share Information with NAME: Chad Walsh ?   ? Permission granted to share info w Relationship: father ? Permission granted to share info w Contact Information: [phone] ? ?Emotional Assessment ?Appearance:: Appears older than stated age ?Attitude/Demeanor/Rapport: Engaged ?Affect (typically observed): Accepting ?Orientation: : Oriented to Self, Oriented to Place, Oriented to  Time, Oriented  to Situation ?Alcohol / Substance Use: Alcohol Use ?Psych Involvement: No (comment) ? ?Admission diagnosis:  Alcoholic ketoacidosis [L79.89] ?Alcohol abuse [F10.10] ?Upper GI bleed [K92.2] ?Acute blood loss anemia (ABLA) [D62] ?Patient Active Problem List  ? Diagnosis Date Noted  ? Hyponatremia 11/13/2021  ? Upper GI bleed   ? Esophageal varices in alcoholic  cirrhosis (HCC)   ? Acute blood loss anemia (ABLA) 11/12/2021  ? Alcoholic liver disease ([place]) 11/12/2021  ? Closed left ankle fracture 09/16/2020  ? Transaminitis 09/16/2020  ? Hypokalemia 09/16/2020  ? Subdural hematoma, post-traumatic 09/16/2020  ? Thrombocytopenia ([place]) 09/16/2020  ? Alcohol abuse 08/01/2020  ? Depression 08/01/2020  ? Grieving 08/01/2020  ? Gastric reflux 08/01/2020  ? Vitamin D deficiency 08/01/2020  ? ?PCP:  Patient, No Pcp Per (Inactive) ?Pharmacy:   ?[id number] [address], [address] ?[place] ?[place] [address] ?Phone: [phone] Fax: [phone] ? ? ? ? ?Social Determinants of Health (SDOH) Interventions ?  ? ?Readmission Risk Interventions ?   ? View : No data to display.  ?  ?  ?  ? ? ? ?

## 2021-11-14 NOTE — Assessment & Plan Note (Addendum)
Secondary to GI blood loss from hematemesis and passage of melena stools. ?EGD with variceal bleed-banding done. ?Continue octreotide drip-for 72 hours, day 2 today ?Continue IV Protonix infusion for 72 hours, day 2 today ?S/p 2 unit of PRBC and 2 units of FFP. ?Iron studies was negative for any iron deficiency. ?-Monitor hemoglobin ?-Transfuse if below 7  ? ?

## 2021-11-15 ENCOUNTER — Other Ambulatory Visit: Payer: Self-pay

## 2021-11-15 ENCOUNTER — Encounter: Payer: Self-pay | Admitting: Gastroenterology

## 2021-11-15 DIAGNOSIS — K7010 Alcoholic hepatitis without ascites: Secondary | ICD-10-CM

## 2021-11-15 LAB — CBC
HCT: 26.7 % — ABNORMAL LOW (ref 39.0–52.0)
Hemoglobin: 8.7 g/dL — ABNORMAL LOW (ref 13.0–17.0)
MCH: 31 pg (ref 26.0–34.0)
MCHC: 32.6 g/dL (ref 30.0–36.0)
MCV: 95 fL (ref 80.0–100.0)
Platelets: 169 10*3/uL (ref 150–400)
RBC: 2.81 MIL/uL — ABNORMAL LOW (ref 4.22–5.81)
RDW: 18.3 % — ABNORMAL HIGH (ref 11.5–15.5)
WBC: 6.2 10*3/uL (ref 4.0–10.5)
nRBC: 0.8 % — ABNORMAL HIGH (ref 0.0–0.2)

## 2021-11-15 LAB — BPAM FFP
Blood Product Expiration Date: 202303282359
Blood Product Expiration Date: 202303282359
ISSUE DATE / TIME: 202303230136
ISSUE DATE / TIME: 202303230250
Unit Type and Rh: 600
Unit Type and Rh: 6200

## 2021-11-15 LAB — BASIC METABOLIC PANEL
Anion gap: 10 (ref 5–15)
BUN: 17 mg/dL (ref 6–20)
CO2: 27 mmol/L (ref 22–32)
Calcium: 8.4 mg/dL — ABNORMAL LOW (ref 8.9–10.3)
Chloride: 100 mmol/L (ref 98–111)
Creatinine, Ser: 0.55 mg/dL — ABNORMAL LOW (ref 0.61–1.24)
GFR, Estimated: >60 mL/min (ref >60)
Glucose, Bld: 109 mg/dL — ABNORMAL HIGH (ref 70–99)
Potassium: 3.7 mmol/L (ref 3.5–5.1)
Sodium: 137 mmol/L (ref 135–145)

## 2021-11-15 LAB — PREPARE FRESH FROZEN PLASMA: Unit division: 0

## 2021-11-15 LAB — HEMOGLOBIN AND HEMATOCRIT, BLOOD
HCT: 24.7 % — ABNORMAL LOW (ref 39.0–52.0)
Hemoglobin: 7.9 g/dL — ABNORMAL LOW (ref 13.0–17.0)

## 2021-11-15 MED ORDER — ALUM & MAG HYDROXIDE-SIMETH 200-200-20 MG/5ML PO SUSP
15.0000 mL | Freq: Four times a day (QID) | ORAL | 0 refills | Status: DC | PRN
  Filled 2021-11-15: qty 355, 6d supply, fill #0

## 2021-11-15 MED ORDER — PREDNISONE 20 MG PO TABS
40.0000 mg | ORAL_TABLET | Freq: Every day | ORAL | 0 refills | Status: DC
  Filled 2021-11-15: qty 40, 20d supply, fill #0

## 2021-11-15 MED ORDER — VITAMIN B-1 100 MG PO TABS
100.0000 mg | ORAL_TABLET | Freq: Every day | ORAL | 1 refills | Status: DC
  Filled 2021-11-15: qty 30, 30d supply, fill #0

## 2021-11-15 MED ORDER — FOLIC ACID 1 MG PO TABS
1.0000 mg | ORAL_TABLET | Freq: Every day | ORAL | 1 refills | Status: DC
  Filled 2021-11-15: qty 30, 30d supply, fill #0

## 2021-11-15 MED ORDER — NADOLOL 20 MG PO TABS
20.0000 mg | ORAL_TABLET | Freq: Every day | ORAL | 11 refills | Status: DC
  Filled 2021-11-15: qty 30, 30d supply, fill #0

## 2021-11-15 MED ORDER — PANTOPRAZOLE SODIUM 40 MG PO TBEC
40.0000 mg | DELAYED_RELEASE_TABLET | Freq: Two times a day (BID) | ORAL | 1 refills | Status: DC
Start: 2021-11-15 — End: 2023-03-02
  Filled 2021-11-15: qty 60, 30d supply, fill #0

## 2021-11-15 MED ORDER — LIDOCAINE 5 % EX PTCH
2.0000 | MEDICATED_PATCH | CUTANEOUS | 0 refills | Status: DC
  Filled 2021-11-15: qty 30, 15d supply, fill #0

## 2021-11-15 NOTE — Discharge Summary (Signed)
?Physician Discharge Summary ?  ?Patient: Chad Walsh MRN: [id number] DOB: June 22, 1981  ?Admit date:     11/12/2021  ?Discharge date: 11/15/21  ?Discharge Physician: Arnetha CourserSumayya Teiana Hajduk  ? ?PCP: Patient, No Pcp Per (Inactive)  ? ?Recommendations at discharge:  ?Please obtain CBC and CMP in 1 week ?Follow-up with primary care doctor in 1 week ?Follow-up with gastroenterology in 2 weeks, patient is being discharged on nadolol and prednisone for alcoholic hepatitis and need a slow taper which will be done at GI office. ? ?Discharge Diagnoses: ?Principal Problem: ?  Acute blood loss anemia (ABLA) ?Active Problems: ?  Upper GI bleed ?  Esophageal varices in alcoholic cirrhosis (HCC) ?  Alcoholic liver disease (HCC) ?  Alcohol abuse ?  Depression ?  Gastric reflux ?  Transaminitis ?  Hyponatremia ? ?Hospital Course: ?Taken from H&P ? ? Chad Walsh is a 41 y.o. male with medical history significant for alcohol abuse, anxiety, depression and GERD who presents to the emergency room via EMS for evaluation of 2 episodes of hematemesis as well as passage of melena stools and hematochezia. ?Patient states that he has been drinking daily for the last 4 weeks but started cutting down on his alcohol use.  He admits to having symptoms of alcohol withdrawal when he does not drink.  He states that he started dry heaving the day prior to his admission and subsequently had 2 episodes of coffee-ground emesis with some blood clots.  On the day of admission he had several episodes of initially dark bloody stools and then bright red blood per rectum. ?Patient denies any abdominal pain.  He uses NSAIDs occasionally for bursitis in his hip as well as ankle pain.  He was also feeling dizzy, no falls. ? ?On arrival to ED he was hemodynamically stable, there is if 5 unit fall in his hemoglobin.  CMP with sodium of 129, chloride 84, albumin 3.4, AST 317, ALT 150, alkaline phosphatase 134, total bilirubin 8.3, INR 1.4, leukocytosis with  white cell count of 14.7 and hemoglobin of 10.6 which decreased to 7.7 on repeat CBC. ? ?GI evaluated him.  He was started on octreotide and Protonix infusion for 72 hours.  He was taken for EGD and found to have a bleeding esophageal varices with severe portal hypertensive gastropathy, 3 bands were placed to stop bleeding. ?2 unit of PRBC was ordered this morning. ?4 units of FFP ordered at the advice of GI. ? ?Because of his abnormal liver function and transaminitis, concern of alcoholic hepatitis.  Hepatitis panel is negative. ?Maddrey's discriminant function 34.1, he was started on prednisone 40 mg daily.  GI advised to calculate Lille score on day 5, if showing improvement, will continue prednisone course. ? ?No prior history of liver cirrhosis but due to EGD findings-ordered liver ultrasound. ? ?3/23: Liver ultrasound with hepatic steatosis and mild nodularity which can be early cirrhosis. ?Had 1 melanotic stools since this morning, hemoglobin at 8.1. ?GI if to continue octreotide and Protonix infusion for 72 hours, day 2 today. ?They are also recommending to start nadolol on discharge-patient will need a repeat EGD in 4 weeks. ? ?3/24: Hemoglobin remained stable and improving.  Patient was able to tolerate advancement in diet.  No more melanotic stools, had a large normal color bowel movement on the day of discharge.  Received 72 hours of octreotide and Protonix infusion before discharge.  He was also started on nadolol 20 mg daily.  He will continue prednisone 40 mg daily for  next 2 weeks until he sees his gastroenterologist for further recommendations. ? ?Patient was again counseled against alcohol abuse. ? ?He will continue current medications and follow-up with his providers. ? ?Assessment and Plan: ?* Acute blood loss anemia (ABLA) ?Secondary to GI blood loss from hematemesis and passage of melena stools. ?EGD with variceal bleed-banding done. ?Continue octreotide drip-for 72 hours, day 2 today ?Continue  IV Protonix infusion for 72 hours, day 2 today ?S/p 2 unit of PRBC and 2 units of FFP. ?Iron studies was negative for any iron deficiency. ?-Monitor hemoglobin ?-Transfuse if below 7  ? ? ?Esophageal varices in alcoholic cirrhosis (HCC) ?No prior diagnosis of liver cirrhosis.  EGD findings are concerning for cirrhosis with portal hypertension and esophageal varices bleed. ?Liver ultrasound with hepatic steatosis and mild nodularity, concerning for early cirrhosis. ?-Monitor liver function ?-Patient will need to be on nadolol on discharge ?-He will also need a repeat EGD in 4 weeks ?-Advance diet to full liquid per GI ? ? ? ?Upper GI bleed ?See above under acute blood loss. ? ?Alcoholic liver disease (HCC) ?As evidenced by marked transaminitis, elevated total bilirubin and elevated INR. ?Concern for possible alcoholic hepatitis ?Patient has been advised abstain from further alcohol use ?Patient's Madrey discriminant function is 34.1, he was started on prednisone. ?GI is recommending to calculate Lille score on day 5, if improving then they will continue prednisone course. ?-Continue prednisone at 40 mg daily. ?-Continue with thiamine and folic acid ? ? ?Alcohol abuse ?Patient admits to alcohol abuse and has symptoms of alcohol withdrawal when he does not drink. ?He remains at high risk for developing symptoms of alcohol withdrawal, currently stable with CIWA score of 2 ?Place patient on banana bag with MVI/thiamine/folic acid. ?-Continue with CIWA protocol ? ? ?Hyponatremia ?Most likely secondary to alcohol abuse and liver disease. ?Patient was also on Lexapro which can be contributory. ?-Holding Lexapro ?-Monitor sodium-currently at 130 ? ?Depression ?Hold Lexapro for now ? ? ?Consultants: Gastroenterology ?Procedures performed: EGD with banding of esophageal varices ?Disposition: Home ?Diet recommendation:  ?Discharge Diet Orders (From admission, onward)  ? ?  Start     Ordered  ? 11/15/21 0000  Diet - low sodium  heart healthy       ? 11/15/21 1145  ? ?  ?  ? ?  ? ?Cardiac and Carb modified diet ?DISCHARGE MEDICATION: ?Allergies as of 11/15/2021   ?No Known Allergies ?  ? ?  ?Medication List  ?  ? ?STOP taking these medications   ? ?escitalopram 10 MG tablet ?Commonly known as: Lexapro ?  ?omeprazole 20 MG capsule ?Commonly known as: PRILOSEC ?  ?ondansetron 4 MG disintegrating tablet ?Commonly known as: Zofran ODT ?  ?oxycodone 5 MG capsule ?Commonly known as: OXY-IR ?  ? ?  ? ?TAKE these medications   ? ?alum & mag hydroxide-simeth 200-200-20 MG/5ML suspension ?Commonly known as: MAALOX/MYLANTA ?Take 15 mLs by mouth once every 6 (six) hours as needed for indigestion or heartburn. ?  ?folic acid 1 MG tablet ?Commonly known as: FOLVITE ?Take 1 tablet (1 mg total) by mouth once daily. ?Start taking on: November 16, 2021 ?  ?lidocaine 5 % ?Commonly known as: LIDODERM ?Place 2 patches topically onto the skin once daily. Remove and discard patches within 12 hours or as directed by doctor. (Max of 2 patches in 24 hours). ?  ?MULTIVITAMIN MEN PO ?Take by mouth daily. ?  ?nadolol 20 MG tablet ?Commonly known as: Corgard ?Take 1 tablet (20  mg total) by mouth once daily. ?  ?pantoprazole 40 MG tablet ?Commonly known as: Protonix ?Take 1 tablet (40 mg total) by mouth 2 (two) times daily. ?  ?predniSONE 20 MG tablet ?Commonly known as: DELTASONE ?Take 2 tablets (40 mg total) by mouth once daily with breakfast. ?Start taking on: November 16, 2021 ?  ?thiamine 100 MG tablet ?Commonly known as: Vitamin B-1 ?Take 1 tablet (100 mg total) by mouth once daily. ?Start taking on: November 16, 2021 ?  ?vitamin D3 50 MCG (2000 UT) Caps ?Take by mouth daily. ?  ? ?  ? ? Follow-up Information   ? ? Toney Reil, MD. Schedule an appointment as soon as possible for a visit in 2 week(s).   ?Specialty: Gastroenterology ?Why: Office closed. The patient will need to make a follow up appointment. ?Contact information: ?[address] ?[place]  [address] ?[phone] ? ? ?  ?  ? ?  ?  ? ?  ? ?Discharge Exam: ?Filed Weights  ? 11/12/21 1351 11/12/21 1600  ?Weight: 99.8 kg 113.4 kg  ? ?General.     In no acute distress. ?Pulmonary.  Lungs clear bilatera

## 2021-11-15 NOTE — Progress Notes (Signed)
-year-old ? ? ?[name], MD ?[address]  ?[address]  ?[place], [place] [address]  ?Main: [phone]  ?Fax: [phone] ?Pager: [phone] ? ? ?Subjective: ?Patient is transferred out of the ICU.  No acute events overnight.  He had 1 brown bowel movement this morning.  Patient denies any abdominal pain, distention, swelling of legs ? ?Objective: ?Vital signs in last 24 hours: ?Vitals:  ? 11/15/21 0000 11/15/21 0400 11/15/21 0810 11/15/21 1138  ?BP: 122/82 119/68 (!) 140/92 138/80  ?Pulse: (!) 103 93 99 96  ?Resp: 20 16 18 18   ?Temp: 98.5 ?F (36.9 ?C) 98.3 ?F (36.8 ?C) 98.3 ?F (36.8 ?C) 98.2 ?F (36.8 ?C)  ?TempSrc: Oral Oral    ?SpO2: 98% 94% 95% 96%  ?Weight:      ?Height:      ? ?Weight change:  ? ?Intake/Output Summary (Last 24 hours) at 11/15/2021 1217 ?Last data filed at 11/15/2021 1009 ?Gross per 24 hour  ?Intake 1589.08 ml  ?Output 1450 ml  ?Net 139.08 ml  ? ? ?Exam: ?Heart:: Tachycardia, regular rhythm ?Lungs: normal and clear to auscultation ?Abdomen: soft, nontender, normal bowel sounds ? ? ?Lab Results: ? ?  Latest Ref Rng & Units 11/15/2021  ?  8:19 AM 11/15/2021  ?  6:11 AM 11/14/2021  ? 10:25 PM  ?CBC  ?WBC 4.0 - 10.5 K/uL 6.2      ?Hemoglobin 13.0 - 17.0 g/dL 8.7   7.9   8.5    ?Hematocrit 39.0 - 52.0 % 26.7   24.7   26.3    ?Platelets 150 - 400 K/uL 169      ? ? ?  Latest Ref Rng & Units 11/15/2021  ?  8:19 AM 11/14/2021  ?  6:04 AM 11/13/2021  ?  3:39 AM  ?CMP  ?Glucose 70 - 99 mg/dL 109   83   155    ?BUN 6 - 20 mg/dL 17   22   23     ?Creatinine 0.61 - 1.24 mg/dL 0.55   0.66   0.83    ?Sodium 135 - 145 mmol/L 137   136   130    ?Potassium 3.5 - 5.1 mmol/L 3.7   3.0   3.9    ?Chloride 98 - 111 mmol/L 100   97   93    ?CO2 22 - 32 mmol/L 27   31   26     ?Calcium 8.9 - 10.3 mg/dL 8.4   8.2   8.4    ?Total Protein 6.5 - 8.1 g/dL  5.5   5.6    ?Total Bilirubin 0.3 - 1.2 mg/dL  7.2   7.7    ?Alkaline Phos 38 - 126 U/L  80   102    ?AST 15 - 41 U/L  176   253    ?ALT 0 - 44 U/L  77   113     ? ? ?Micro Results: ?Recent Results (from the past 240 hour(s))  ?MRSA Next Gen by PCR, Nasal     Status: None  ? Collection Time: 11/12/21  4:00 PM  ? Specimen: Nasal Mucosa; Nasal Swab  ?Result Value Ref Range Status  ? MRSA by PCR Next Gen NOT DETECTED NOT DETECTED Final  ?  Comment: (NOTE) ?The GeneXpert MRSA Assay (FDA approved for NASAL specimens only), ?is one component of a comprehensive MRSA colonization surveillance ?program. It is not intended to diagnose MRSA infection nor to guide ?or monitor treatment for MRSA infections. ?Test  performance is not FDA approved in patients less than 2 years ?old. ?Performed at [facility], [place], ?[place] [address] ?  ? ?Studies/Results: ?US Abdomen Limited RUQ (LIVER/GB) ? ?Result Date: 11/13/2021 ?CLINICAL DATA:  Abnormal liver function test EXAM: ULTRASOUND ABDOMEN LIMITED RIGHT UPPER QUADRANT COMPARISON:  08/01/2020 FINDINGS: Gallbladder: Normal wall thickness measuring 2.5 mm. No Murphy's sign. No definite gallstones. Layering gallbladder sludge as before. No pericholecystic fluid or signs of cholecystitis. Common bile duct: Diameter: 8 mm Liver: Increased echogenicity compatible with hepatic steatosis. Some degree of surface nodularity suggesting cirrhosis as well. No large focal abnormality or intrahepatic biliary dilatation. Portal vein is patent on color Doppler imaging with normal direction of blood flow towards the liver. Other: Included views of the right kidney demonstrate no hydronephrosis. No surrounding free fluid or ascites. IMPRESSION: Gallbladder sludge.  Negative for gallstones or cholecystitis Hepatic steatosis and cirrhosis. Electronically Signed   By: Jerilynn Mages.  Shick M.D.   On: 11/13/2021 16:10   ?Medications: I have reviewed the patient's current medications. ?Prior to Admission:  ?Medications Prior to Admission  ?Medication Sig Dispense Refill Last Dose  ? Cholecalciferol (VITAMIN D3) 50 MCG (2000 UT) CAPS Take by mouth  daily. (Patient not taking: Reported on 06/11/2021)     ? escitalopram (LEXAPRO) 10 MG tablet Take 1 tablet (10 mg total) by mouth daily. 30 tablet 1   ? Multiple Vitamins-Minerals (MULTIVITAMIN MEN PO) Take by mouth daily. (Patient not taking: Reported on 06/11/2021)     ? omeprazole (PRILOSEC) 20 MG capsule Take 20 mg by mouth daily. (Patient not taking: Reported on 06/11/2021)     ? ondansetron (ZOFRAN ODT) 4 MG disintegrating tablet Take 1 tablet (4 mg total) by mouth every 8 (eight) hours as needed for nausea or vomiting. (Patient not taking: Reported on 06/11/2021) 20 tablet 0   ? oxycodone (OXY-IR) 5 MG capsule Take 5 mg by mouth every 4 (four) hours as needed. (Patient not taking: Reported on 06/11/2021)     ? ?Scheduled: ? Chlorhexidine Gluconate Cloth  6 each Topical Q0600  ? folic acid  1 mg Oral Daily  ? lidocaine  2 patch Transdermal Q24H  ? [START ON 11/16/2021] pantoprazole  40 mg Intravenous Q12H  ? predniSONE  40 mg Oral Q breakfast  ? thiamine  100 mg Oral Daily  ? Or  ? thiamine  100 mg Intravenous Daily  ? ?Continuous: ? cefTRIAXone (ROCEPHIN)  IV 2 g (11/14/21 1625)  ? octreotide  (SANDOSTATIN)    IV infusion 50 mcg/hr (11/15/21 0528)  ? pantoprazole 8 mg/hr (11/15/21 0539)  ? ?[id number] & mag hydroxide-simeth, melatonin, ondansetron **OR** ondansetron (ZOFRAN) IV ?Anti-infectives (From admission, onward)  ? ? Start     Dose/Rate Route Frequency Ordered Stop  ? 11/13/21 1400  cefTRIAXone (ROCEPHIN) 2 g in sodium chloride 0.9 % 100 mL IVPB       ? 2 g ?200 mL/hr over 30 Minutes Intravenous Every 24 hours 11/13/21 1149    ? 11/13/21 1145  cefTRIAXone (ROCEPHIN) 1 g in sodium chloride 0.9 % 100 mL IVPB  Status:  Discontinued       ? 1 g ?200 mL/hr over 30 Minutes Intravenous Daily 11/13/21 1144 11/13/21 1149  ? 11/12/21 1415  cefTRIAXone (ROCEPHIN) 1 g in sodium chloride 0.9 % 100 mL IVPB       ? 1 g ?200 mL/hr over 30 Minutes Intravenous  Once 11/12/21 1411 11/12/21 1454  ? ?  ? ?Scheduled Meds: ?  Chlorhexidine Gluconate Cloth  6 each Topical Q0600  ? folic acid  1 mg Oral Daily  ? lidocaine  2 patch Transdermal Q24H  ? [START ON 11/16/2021] pantoprazole  40 mg Intravenous Q12H  ? predniSONE  40 mg Oral Q breakfast  ? thiamine  100 mg Oral Daily  ? Or  ? thiamine  100 mg Intravenous Daily  ? ?Continuous Infusions: ? cefTRIAXone (ROCEPHIN)  IV 2 g (11/14/21 1625)  ? octreotide  (SANDOSTATIN)    IV infusion 50 mcg/hr (11/15/21 0528)  ? pantoprazole 8 mg/hr (11/15/21 0539)  ? ?PRN Meds:.alum & mag hydroxide-simeth, melatonin, ondansetron **OR** ondansetron (ZOFRAN) IV ? ? ?Assessment: ?Principal Problem: ?  Acute blood loss anemia (ABLA) ?Active Problems: ?  Alcohol abuse ?  Depression ?  Gastric reflux ?  Transaminitis ?  Alcoholic liver disease ([place]) ?  Upper GI bleed ?  Esophageal varices in alcoholic cirrhosis (HCC) ?  Hyponatremia ? ?Plan: ?Acute upper GI bleed secondary to bleeding esophageal varices s/p EGD on 3/22 ligation x3 ?status post 2 units of PRBCs and 2 units of FFPs ?Right upper quadrant ultrasound with Dopplers revealed cirrhosis and no evidence of PVT ?Continue ceftriaxone 2 g daily for SBP prophylaxis ?Continue octreotide drip for 72 hours, day 3 ?Continue pantoprazole drip for 72 hours, day 3, switch to Protonix 40 mg p.o. twice daily ?Low-sodium diet, patient is seen by dietitian regarding counseling on low-sodium diet ?Patient will need repeat upper endoscopy in 4 weeks for variceal surveillance and possible banding ?Discussed with patient regarding strict abstinence from alcohol use ?Recommend to start nonselective beta-blocker nadolol 20 or 40 mg daily at the time of discharge for secondary prophylaxis of variceal bleeding ?Patient will need close follow-up with GI upon discharge in 2 to 3 weeks ? ?Acute alcoholic hepatitis with cirrhosis ?Commenced on prednisone 40 mg daily on 3/21, Maddrey's discriminant function 34.1 ?LFTs and INR are improving, calculate Lille's score on 3/25,  Continue prednisone course since the Lille score as of today is 0.027.  Score less than 0.45 predicts 85% 35-month survival.  Recommend 2 weeks of prednisone 40 mg daily followed by taper by 10 mg weekly until finished ?

## 2021-11-15 NOTE — TOC Transition Note (Addendum)
Transition of Care (TOC) - CM/SW Discharge Note ? ? ?Patient Details  ?Name: Chad Walsh ?MRN: [id number] ?Date of Birth: Apr 25, 1981 ? ?Transition of Care (TOC) CM/SW Contact:  ?Takya Vandivier E Jairo Bellew, LCSW ?Phone Number: ?11/15/2021, 12:03 PM ? ? ?Clinical Narrative:   Prescriptions being sent to Medication Management by MD. ?Spoke to Medication Management Representative who stated the meds will be filled after 1:30 pm . ? ?1:50- Called Medication Management who confirmed patient's meds are ready, 6 prescriptions total per Medication Management. Called [facility] [employer] who stated a volunteer will go pick up the medicines and take them to patient's bedside RN Glynn Octave. Updated RN. ? ? ?Final next level of care: Home/Self Care ?Barriers to Discharge: Barriers Resolved ? ? ?Patient Goals and CMS Choice ?Patient states their goals for this hospitalization and ongoing recovery are:: will stop drinking for good ?  ?  ? ?Discharge Placement ?  ?           ?  ?  ?  ?  ? ?Discharge Plan and Services ?  ?Discharge Planning Services: CM Consult ?           ?DME Arranged: N/A ?DME Agency: NA ?  ?  ?  ?HH Arranged: NA ?HH Agency: NA ?  ?  ?  ? ?Social Determinants of Health (SDOH) Interventions ?  ? ? ?Readmission Risk Interventions ?   ? View : No data to display.  ?  ?  ?  ? ? ? ? ? ?

## 2021-11-18 ENCOUNTER — Telehealth: Payer: Self-pay

## 2021-11-18 NOTE — Telephone Encounter (Signed)
Tried to call patient but voicemail is full  

## 2021-11-18 NOTE — Telephone Encounter (Signed)
Tried to call patient but mailbox is full unable to leave a voicemail.  ?

## 2021-11-18 NOTE — Telephone Encounter (Signed)
No DPR so can not call emergency contact  ?

## 2021-11-18 NOTE — Telephone Encounter (Signed)
-----   Message from Lin Landsman, MD sent at 11/15/2021 12:10 PM EDT ----- ?Regarding: Hospital follow-up ?Chad Walsh ? ?Please make a follow-up to see me in 2 weeks, okay to overbook ?He will also need repeat EGD in 1 month for variceal surveillance ?Dx: Decompensated cirrhosis of liver ? ?RV ? ?

## 2021-11-19 NOTE — Telephone Encounter (Signed)
Tried to call patient but mailbox is full. Unable to reach patient. Please advised  ?

## 2023-02-21 ENCOUNTER — Other Ambulatory Visit: Payer: Self-pay

## 2023-02-21 ENCOUNTER — Emergency Department
Admission: EM | Admit: 2023-02-21 | Discharge: 2023-02-22 | Disposition: A | Discharge status: Home / Self Care | Payer: No Typology Code available for payment source | Attending: Emergency Medicine | Admitting: Emergency Medicine

## 2023-02-21 DIAGNOSIS — F4321 Adjustment disorder with depressed mood: Secondary | ICD-10-CM | POA: Diagnosis present

## 2023-02-21 DIAGNOSIS — Y908 Blood alcohol level of 240 mg/100 ml or more: Secondary | ICD-10-CM | POA: Diagnosis not present

## 2023-02-21 DIAGNOSIS — R45851 Suicidal ideations: Secondary | ICD-10-CM

## 2023-02-21 DIAGNOSIS — Z79899 Other long term (current) drug therapy: Secondary | ICD-10-CM | POA: Insufficient documentation

## 2023-02-21 DIAGNOSIS — F1010 Alcohol abuse, uncomplicated: Secondary | ICD-10-CM | POA: Diagnosis present

## 2023-02-21 DIAGNOSIS — F4381 Prolonged grief disorder: Secondary | ICD-10-CM | POA: Insufficient documentation

## 2023-02-21 DIAGNOSIS — F32A Depression, unspecified: Secondary | ICD-10-CM | POA: Diagnosis not present

## 2023-02-21 LAB — CBC
HCT: 45.1 % (ref 39.0–52.0)
Hemoglobin: 14.7 g/dL (ref 13.0–17.0)
MCH: 27.2 pg (ref 26.0–34.0)
MCHC: 32.6 g/dL (ref 30.0–36.0)
MCV: 83.4 fL (ref 80.0–100.0)
Platelets: 441 10*3/uL — ABNORMAL HIGH (ref 150–400)
RBC: 5.41 MIL/uL (ref 4.22–5.81)
RDW: 15.8 % — ABNORMAL HIGH (ref 11.5–15.5)
WBC: 11.5 10*3/uL — ABNORMAL HIGH (ref 4.0–10.5)
nRBC: 0 % (ref 0.0–0.2)

## 2023-02-21 LAB — COMPREHENSIVE METABOLIC PANEL
ALT: 44 U/L (ref 0–44)
AST: 42 U/L — ABNORMAL HIGH (ref 15–41)
Albumin: 4.7 g/dL (ref 3.5–5.0)
Alkaline Phosphatase: 139 U/L — ABNORMAL HIGH (ref 38–126)
Anion gap: 13 (ref 5–15)
BUN: 13 mg/dL (ref 6–20)
CO2: 22 mmol/L (ref 22–32)
Calcium: 8.7 mg/dL — ABNORMAL LOW (ref 8.9–10.3)
Chloride: 102 mmol/L (ref 98–111)
Creatinine, Ser: 0.96 mg/dL (ref 0.61–1.24)
GFR, Estimated: >60 mL/min (ref >60)
Glucose, Bld: 146 mg/dL — ABNORMAL HIGH (ref 70–99)
Potassium: 3.7 mmol/L (ref 3.5–5.1)
Sodium: 137 mmol/L (ref 135–145)
Total Bilirubin: 1 mg/dL (ref 0.3–1.2)
Total Protein: 8.3 g/dL — ABNORMAL HIGH (ref 6.5–8.1)

## 2023-02-21 LAB — ACETAMINOPHEN LEVEL: Acetaminophen (Tylenol), Serum: <10 ug/mL — ABNORMAL LOW (ref 10–30)

## 2023-02-21 LAB — SALICYLATE LEVEL: Salicylate Lvl: <7 mg/dL — ABNORMAL LOW (ref 7.0–30.0)

## 2023-02-21 MED ORDER — THIAMINE MONONITRATE 100 MG PO TABS: 100.0000 mg | ORAL_TABLET | Freq: Every day | ORAL | Status: DC

## 2023-02-21 MED ORDER — THIAMINE HCL 100 MG/ML IJ SOLN: 100.0000 mg | Freq: Every day | INTRAMUSCULAR | Status: DC

## 2023-02-21 MED ORDER — FOLIC ACID 1 MG PO TABS: 1.0000 mg | ORAL_TABLET | Freq: Every day | ORAL | Status: DC

## 2023-02-21 MED ORDER — LORAZEPAM 1 MG PO TABS
1.0000 mg | ORAL_TABLET | ORAL | Status: DC | PRN
  Filled 2023-02-21: qty 1

## 2023-02-21 MED ORDER — LORAZEPAM 1 MG PO TABS
1.0000 mg | ORAL_TABLET | ORAL | Status: DC | PRN
  Administered 2023-02-22: 1 mg via ORAL

## 2023-02-21 MED ORDER — ADULT MULTIVITAMIN W/MINERALS CH: 1.0000 | ORAL_TABLET | Freq: Every day | ORAL | Status: DC

## 2023-02-21 NOTE — ED Provider Notes (Signed)
[facility] Provider Note    Event Date/Time   First MD Initiated Contact with Patient 02/21/23 2259     (approximate)   History   Psychiatric Evaluation (VOL)   HPI  Chad Walsh is a 41 y.o. male who presents to the ED for evaluation of Psychiatric Evaluation (VOL)   I review of medical DC summary from a year ago.  History of alcoholism and various ED visits and admissions for alcohol-related complications.  This was an upper GI bleed.  Patient presents with vague suicidal thoughts in the setting of alcohol intoxication.  No clear plan.  Denies coingestions beyond ethanol   Physical Exam   Triage Vital Signs: ED Triage Vitals [02/21/23 2242]  Enc Vitals Group     BP (!) 133/96     Pulse Rate (!) 120     Resp 18     Temp 98.3 F (36.8 C)     Temp Source Oral     SpO2 99 %     Weight      Height 5\' 8"  (1.727 m)     Head Circumference      Peak Flow      Pain Score 0     Pain Loc      Pain Edu?      Excl. in GC?     Most recent vital signs: Vitals:   02/21/23 2242 02/22/23 0616  BP: (!) 133/96 (!) 130/96  Pulse: (!) 120 (!) 108  Resp: 18 16  Temp: 98.3 F (36.8 C) 98.3 F (36.8 C)  SpO2: 99% 97%    General: Awake, no distress.  Intoxicated, pleasant, tearful CV:  Good peripheral perfusion.  Resp:  Normal effort.  Abd:  No distention.  MSK:  No deformity noted.  Neuro:  No focal deficits appreciated. Other:     ED Results / Procedures / Treatments   Labs (all labs ordered are listed, but only abnormal results are displayed) Labs Reviewed  COMPREHENSIVE METABOLIC PANEL - Abnormal; Notable for the following components:      Result Value   Glucose, Bld 146 (*)    Calcium 8.7 (*)    Total Protein 8.3 (*)    AST 42 (*)    Alkaline Phosphatase 139 (*)    All other components within normal limits  ETHANOL - Abnormal; Notable for the following components:   Alcohol, Ethyl (B) 380 (*)    All other components within  normal limits  SALICYLATE LEVEL - Abnormal; Notable for the following components:   Salicylate Lvl <7.0 (*)    All other components within normal limits  ACETAMINOPHEN LEVEL - Abnormal; Notable for the following components:   Acetaminophen (Tylenol), Serum <10 (*)    All other components within normal limits  CBC - Abnormal; Notable for the following components:   WBC 11.5 (*)    RDW 15.8 (*)    Platelets 441 (*)    All other components within normal limits  URINE DRUG SCREEN, QUALITATIVE ([facility] ONLY)    EKG   RADIOLOGY   Official radiology report(s): No results found.  PROCEDURES and INTERVENTIONS:  Procedures  Medications  LORazepam (ATIVAN) tablet 1-4 mg ( Oral See Alternative 02/22/23 0105)    Or  LORazepam (ATIVAN) tablet 1 mg (1 mg Oral Given 02/22/23 0105)  thiamine (VITAMIN B1) tablet 100 mg (has no administration in time range)    Or  thiamine (VITAMIN B1) injection 100 mg (has no administration in time  range)  folic acid (FOLVITE) tablet 1 mg (has no administration in time range)  multivitamin with minerals tablet 1 tablet (has no administration in time range)  ondansetron (ZOFRAN-ODT) disintegrating tablet 4 mg (4 mg Oral Given 02/22/23 0447)     IMPRESSION / MDM / ASSESSMENT AND PLAN / ED COURSE  I reviewed the triage vital signs and the nursing notes.  Differential diagnosis includes, but is not limited to, substance-induced mood disorder, alcoholism, polysubstance abuse, suicidal, OD attempt  {Patient presents with symptoms of an acute illness or injury that is potentially life-threatening.  Patient presents with vague suicidal thoughts in the setting of ethanol intoxication.  No signs of coingestions or particular toxidromes beyond his alcohol.  Ethanol level in the serum is elevated.  Mild elevation of AST and ALP are likely related to this and I doubt hepatobiliary disease acutely.  Will consult psychiatry and keep him here voluntarily.      FINAL  CLINICAL IMPRESSION(S) / ED DIAGNOSES   Final diagnoses:  Alcohol abuse  Suicidal ideation     Rx / DC Orders   ED Discharge Orders     None        Note:  This document was prepared using Dragon voice recognition software and may include unintentional dictation errors.   Delton [place], MD 02/22/23 [phone]

## 2023-02-21 NOTE — Consult Note (Addendum)
[facility] Face-to-Face Psychiatry Consult   Reason for Consult:  Psych Evalaution Referring Physician:  Dr. Katrinka Blazing Patient Identification: Chad Walsh MRN:  [id number] Principal Diagnosis: Suicidal ideation Diagnosis:  Principal Problem:   Suicidal ideation Active Problems:   Alcohol abuse   Depression   Grieving   Total Time spent with patient: 45 minutes  Subjective:   " I feel like I just wanna die "  HPI:   Chad Walsh, 42 y.o., male patient seen  and chart reviewed  by this provider; and consulted with Dr. Katrinka Blazing on 02/21/23.  This patient came to the emergency room voluntarily seeking assistance for his suidcidal thoughts.    He says he is feeling miserable physically sick and depressed.  He has been drinking 10-12 beers a day for the week after being sober for 15 months.   He says he began thinking about his deceased mother and became depressed again.  Denies that he has been using any other drugs.  Mood is been dysphoric grieving and down.  Denies any suicidal or homicidal thought.  Denies any violence or thought of violence.  No hallucinations.    No history of substance abuse treatment in the past.  He describes chronic anxiety and depression for which he goes to [facility] for treatment.  He says he is currently takes welbrutrin which has says helps.   Currently cooperative.  BAL has not been resulted at the time of assessment   Per chart review, Dr. Darlin Drop states that patient  Apparently never had mental health treatment in the past.  No prior admissions no substance abuse treatment.  He says about 10 years ago he took an overdose but never got any follow-up treatment for it and he thinks it was mostly a "cry for help".   Recommendation:  Substance abuse treatment   Past Psychiatric History: Depression, etoh abuse  Risk to Self:   Risk to Others:   Prior Inpatient Therapy:   Prior Outpatient Therapy:    Past Medical History:  Past Medical History:  Diagnosis Date    Anxiety    Depression    ETOH abuse    GERD (gastroesophageal reflux disease)     Past Surgical History:  Procedure Laterality Date   ESOPHAGOGASTRODUODENOSCOPY (EGD) WITH PROPOFOL N/A 11/13/2021   Procedure: ESOPHAGOGASTRODUODENOSCOPY (EGD) WITH PROPOFOL;  Surgeon: Toney Reil, MD;  Location: [facility] ENDOSCOPY;  Service: Gastroenterology;  Laterality: N/A;   ORIF ANKLE FRACTURE Left 09/27/2020   Procedure: OPEN REDUCTION INTERNAL FIXATION (ORIF) ANKLE FRACTURE, SYNDOSIS REPAIR;  Surgeon: Signa Kell, MD;  Location: [facility] ORS;  Service: Orthopedics;  Laterality: Left;   TONSILLECTOMY     WISDOM TOOTH EXTRACTION     Family History:  Family History  Problem Relation Age of Onset   Lung cancer Mother    Family Psychiatric  History: unknown Social History:  Social History   Substance and Sexual Activity  Alcohol Use Yes   Alcohol/week: 28.0 standard drinks of alcohol   Types: 28 Standard drinks or equivalent per week   Comment: 09/25/20 - pt reports no alcohol last 2 weeks     Social History   Substance and Sexual Activity  Drug Use Not Currently   Types: Marijuana   Comment: early 2021 last used    Social History   Socioeconomic History   Marital status: Single    Spouse name: Not on file   Number of children: Not on file   Years of education: Not on file  Highest education level: Not on file  Occupational History   Not on file  Tobacco Use   Smoking status: Former    Packs/day: 0.50    Years: 18.00    Additional pack years: 0.00    Total pack years: 9.00    Types: Cigarettes    Quit date: 03/25/2020    Years since quitting: 2.9   Smokeless tobacco: Never  Vaping Use   Vaping Use: Never used  Substance and Sexual Activity   Alcohol use: Yes    Alcohol/week: 28.0 standard drinks of alcohol    Types: 28 Standard drinks or equivalent per week    Comment: 09/25/20 - pt reports no alcohol last 2 weeks   Drug use: Not Currently    Types: Marijuana    Comment:  early 2021 last used   Sexual activity: Not on file  Other Topics Concern   Not on file  Social History Narrative   Not on file   Social Determinants of Health   Financial Resource Strain: Not on file  Food Insecurity: Not on file  Transportation Needs: Not on file  Physical Activity: Not on file  Stress: Not on file  Social Connections: Not on file   Additional Social History:    Allergies:  No Known Allergies  Labs:  Results for orders placed or performed during the hospital encounter of 02/21/23 (from the past 48 hour(s))  Comprehensive metabolic panel     Status: Abnormal   Collection Time: 02/21/23 10:44 PM  Result Value Ref Range   Sodium 137 135 - 145 mmol/L   Potassium 3.7 3.5 - 5.1 mmol/L   Chloride 102 98 - 111 mmol/L   CO2 22 22 - 32 mmol/L   Glucose, Bld 146 (H) 70 - 99 mg/dL    Comment: Glucose reference range applies only to samples taken after fasting for at least 8 hours.   BUN 13 6 - 20 mg/dL   Creatinine, Ser 1.61 0.61 - 1.24 mg/dL   Calcium 8.7 (L) 8.9 - 10.3 mg/dL   Total Protein 8.3 (H) 6.5 - 8.1 g/dL   Albumin 4.7 3.5 - 5.0 g/dL   AST 42 (H) 15 - 41 U/L   ALT 44 0 - 44 U/L   Alkaline Phosphatase 139 (H) 38 - 126 U/L   Total Bilirubin 1.0 0.3 - 1.2 mg/dL   GFR, Estimated >09 >60 mL/min    Comment: (NOTE) Calculated using the CKD-EPI Creatinine Equation (2021)    Anion gap 13 5 - 15    Comment: Performed at [facility], [address] [place], [place] [address]  Salicylate level     Status: Abnormal   Collection Time: 02/21/23 10:44 PM  Result Value Ref Range   Salicylate Lvl <7.0 (L) 7.0 - 30.0 mg/dL    Comment: Performed at [facility], [address], [place], [place] [address]  Acetaminophen level     Status: Abnormal   Collection Time: 02/21/23 10:44 PM  Result Value Ref Range   Acetaminophen (Tylenol), Serum <10 (L) 10 - 30 ug/mL    Comment: (NOTE) Therapeutic concentrations vary significantly. A range of 10-30  ug/mL  may be an effective concentration for many patients. However, some  are best treated at concentrations outside of this range. Acetaminophen concentrations >150 ug/mL at 4 hours after ingestion  and >50 ug/mL at 12 hours after ingestion are often associated with  toxic reactions.  Performed at [facility], [address] [place],  [place] [address]   cbc     Status: Abnormal   Collection Time: 02/21/23 10:44 PM  Result Value Ref Range   WBC 11.5 (H) 4.0 - 10.5 K/uL   RBC 5.41 4.22 - 5.81 MIL/uL   Hemoglobin 14.7 13.0 - 17.0 g/dL   HCT 96.2 95.2 - 84.1 %   MCV 83.4 80.0 - 100.0 fL   MCH 27.2 26.0 - 34.0 pg   MCHC 32.6 30.0 - 36.0 g/dL   RDW 32.4 (H) 40.1 - 02.7 %   Platelets 441 (H) 150 - 400 K/uL   nRBC 0.0 0.0 - 0.2 %    Comment: Performed at [facility], [address] [place], [place] [address]    No current facility-administered medications for this encounter.   Current Outpatient Medications  Medication Sig Dispense Refill   alum & mag hydroxide-simeth (MAALOX/MYLANTA) 200-200-20 MG/5ML suspension Take 15 mLs by mouth once every 6 (six) hours as needed for indigestion or heartburn. (Patient not taking: Reported on 02/21/2023) 355 mL 0   Cholecalciferol (VITAMIN D3) 50 MCG (2000 UT) CAPS Take by mouth daily. (Patient not taking: Reported on 06/11/2021)     folic acid (FOLVITE) 1 MG tablet Take 1 tablet (1 mg total) by mouth once daily. (Patient not taking: Reported on 02/21/2023) 90 tablet 1   lidocaine (LIDODERM) 5 % Place 2 patches topically onto the skin once daily. Remove and discard patches within 12 hours or as directed by doctor. (Max of 2 patches in 24 hours). (Patient not taking: Reported on 02/21/2023) 30 patch 0   Multiple Vitamins-Minerals (MULTIVITAMIN MEN PO) Take by mouth daily. (Patient not taking: Reported on 06/11/2021)     nadolol (CORGARD) 20 MG tablet Take 1 tablet (20 mg total) by mouth once daily. (Patient not taking: Reported on  02/21/2023) 30 tablet 11   pantoprazole (PROTONIX) 40 MG tablet Take 1 tablet (40 mg total) by mouth 2 (two) times daily. (Patient not taking: Reported on 02/21/2023) 60 tablet 1   predniSONE (DELTASONE) 20 MG tablet Take 2 tablets (40 mg total) by mouth once daily with breakfast. (Patient not taking: Reported on 02/21/2023) 40 tablet 0   thiamine (VITAMIN B-1) 100 MG tablet Take 1 tablet (100 mg total) by mouth once daily. (Patient not taking: Reported on 02/21/2023) 90 tablet 1    Musculoskeletal: Strength & Muscle Tone: within normal limits Gait & Station: normal Patient leans: N/A            Psychiatric Specialty Exam:  Presentation  General Appearance:  Bizarre; Disheveled  Eye Contact: Fair  Speech: Garbled  Speech Volume: Decreased  Handedness: Right   Mood and Affect  Mood: Anxious; Depressed; Worthless; Hopeless  Affect: Congruent   Thought Process  Thought Processes: Coherent  Descriptions of Associations:Intact  Orientation:No data recorded Thought Content:WDL  History of Schizophrenia/Schizoaffective disorder:No data recorded Duration of Psychotic Symptoms:No data recorded Hallucinations:Hallucinations: None  Ideas of Reference:None  Suicidal Thoughts:Suicidal Thoughts: Yes, Active SI Active Intent and/or Plan: With Access to Means  Homicidal Thoughts:Homicidal Thoughts: No   Sensorium  Memory: Immediate Fair  Judgment: Impaired  Insight: Fair   Chartered certified accountant: Fair  Attention Span: Fair  Recall: [employer] of Knowledge: Fair  Language: Fair   Psychomotor Activity  Psychomotor Activity: Psychomotor Activity: Normal   Assets  Assets: Desire for Improvement; Communication Skills; Housing   Sleep  Sleep: Sleep: Poor   Physical Exam: Physical Exam ROS Blood pressure (!) 133/96, pulse (!) 120,  temperature 98.3 F (36.8 C), temperature source Oral, resp. rate 18, height 5\' 8"   (1.727 m), SpO2 99 %. Body mass index is 38.01 kg/m.  Treatment Plan Summary: Daily contact with patient to assess and evaluate symptoms and progress in treatment, Medication management and Plan Patient with alcohol abuse and withdrawal.  Not delirious.  No seizures.    Multiple symptoms of  moderate chronic depression and anxiety but no sign of acute dangerousness.  Does not require IVC.  Ideal disposition I believe would be residential treatment services or [facility] or some other substance abuse facility rather than inpatient psychiatry.    CIWA protocol in place.  Case reviewed with ER physician  Disposition: Supportive therapy provided about ongoing stressors. Refer to IOP. Discussed crisis plan, support from social network, calling 911, coming to the Emergency Department, and calling Suicide Hotline.   Jearld Lesch, NP 02/21/2023 11:45 PM

## 2023-02-21 NOTE — ED Triage Notes (Addendum)
Pt to ed from home via [facility] for SI. Pt is obviously intoxicated and unable to walk straight Pt placed in wheel chair immediately. Pt is SI with no plan. Pt states "I have been drinking all week non stop".

## 2023-02-21 NOTE — Consult Note (Incomplete)
[facility] Face-to-Face Psychiatry Consult   Reason for Consult:  Psych Evalaution Referring Physician:  Dr. Katrinka Blazing Patient Identification: Chad Walsh MRN:  [id number] Principal Diagnosis: Suicidal ideation Diagnosis:  Principal Problem:   Suicidal ideation Active Problems:   Alcohol abuse   Depression   Grieving   Total Time spent with patient: 45 minutes  Subjective:   " I feel like I just wanna die "  HPI:   Chad Walsh, 42 y.o., male patient seen  and chart reviewed  by this provider; and consulted with Dr. Katrinka Blazing on 02/21/23.  This patient came to the emergency room voluntarily seeking assistance for his suidcidal thoughts.    He says he is feeling miserable physically sick and depressed.  He has been drinking 10-12 beers a day for the week after being sober for 15 months.   He says he began thinking about his deceased mother and became depressed again.  Denies that he has been using any other drugs.  Mood is been dysphoric grieving and down.  Denies any suicidal or homicidal thought.  Denies any violence or thought of violence.  No hallucinations.    No history of substance abuse treatment in the past.  He describes chronic anxiety and depression for which he goes to [facility] for treatment.  He says he is currently takes welbrutrin which has says helps.   Currently cooperative.   Per chart recPast Psychiatric History: Apparently never had mental health treatment in the past.  No prior admissions no substance abuse treatment.  He says about 10 years ago he took an overdose but never got any follow-up treatment for it and he thinks it was mostly a "cry for help".     During evaluation Chad Walsh is ***(position); ***he/she is alert/oriented x 4; calm/cooperative; and mood congruent with affect.  Patient is speaking in a clear tone at moderate volume, and normal pace; with good eye contact.  ***His/Her thought process is coherent and relevant; There is no indication that  ***he/she is currently responding to internal/external stimuli or experiencing delusional thought content.  Patient denies suicidal/self-harm/homicidal ideation, psychosis, and paranoia.  Patient has remained calm throughout assessment and has answered questions appropriately.   Recommendation:   Past Psychiatric History: ***  Risk to Self:   Risk to Others:   Prior Inpatient Therapy:   Prior Outpatient Therapy:    Past Medical History:  Past Medical History:  Diagnosis Date  . Anxiety   . Depression   . ETOH abuse   . GERD (gastroesophageal reflux disease)     Past Surgical History:  Procedure Laterality Date  . ESOPHAGOGASTRODUODENOSCOPY (EGD) WITH PROPOFOL N/A 11/13/2021   Procedure: ESOPHAGOGASTRODUODENOSCOPY (EGD) WITH PROPOFOL;  Surgeon: Toney Reil, MD;  Location: [facility] ENDOSCOPY;  Service: Gastroenterology;  Laterality: N/A;  . ORIF ANKLE FRACTURE Left 09/27/2020   Procedure: OPEN REDUCTION INTERNAL FIXATION (ORIF) ANKLE FRACTURE, SYNDOSIS REPAIR;  Surgeon: Signa Kell, MD;  Location: [facility] ORS;  Service: Orthopedics;  Laterality: Left;  . TONSILLECTOMY    . WISDOM TOOTH EXTRACTION     Family History:  Family History  Problem Relation Age of Onset  . Lung cancer Mother    Family Psychiatric  History: *** Social History:  Social History   Substance and Sexual Activity  Alcohol Use Yes  . Alcohol/week: 28.0 standard drinks of alcohol  . Types: 28 Standard drinks or equivalent per week   Comment: 09/25/20 - pt reports no alcohol last 2 weeks  Social History   Substance and Sexual Activity  Drug Use Not Currently  . Types: Marijuana   Comment: early 2021 last used    Social History   Socioeconomic History  . Marital status: Single    Spouse name: Not on file  . Number of children: Not on file  . Years of education: Not on file  . Highest education level: Not on file  Occupational History  . Not on file  Tobacco Use  . Smoking status: Former     Packs/day: 0.50    Years: 18.00    Additional pack years: 0.00    Total pack years: 9.00    Types: Cigarettes    Quit date: 03/25/2020    Years since quitting: 2.9  . Smokeless tobacco: Never  Vaping Use  . Vaping Use: Never used  Substance and Sexual Activity  . Alcohol use: Yes    Alcohol/week: 28.0 standard drinks of alcohol    Types: 28 Standard drinks or equivalent per week    Comment: 09/25/20 - pt reports no alcohol last 2 weeks  . Drug use: Not Currently    Types: Marijuana    Comment: early 2021 last used  . Sexual activity: Not on file  Other Topics Concern  . Not on file  Social History Narrative  . Not on file   Social Determinants of Health   Financial Resource Strain: Not on file  Food Insecurity: Not on file  Transportation Needs: Not on file  Physical Activity: Not on file  Stress: Not on file  Social Connections: Not on file   Additional Social History:    Allergies:  No Known Allergies  Labs:  Results for orders placed or performed during the hospital encounter of 02/21/23 (from the past 48 hour(s))  Comprehensive metabolic panel     Status: Abnormal   Collection Time: 02/21/23 10:44 PM  Result Value Ref Range   Sodium 137 135 - 145 mmol/L   Potassium 3.7 3.5 - 5.1 mmol/L   Chloride 102 98 - 111 mmol/L   CO2 22 22 - 32 mmol/L   Glucose, Bld 146 (H) 70 - 99 mg/dL    Comment: Glucose reference range applies only to samples taken after fasting for at least 8 hours.   BUN 13 6 - 20 mg/dL   Creatinine, Ser 2.13 0.61 - 1.24 mg/dL   Calcium 8.7 (L) 8.9 - 10.3 mg/dL   Total Protein 8.3 (H) 6.5 - 8.1 g/dL   Albumin 4.7 3.5 - 5.0 g/dL   AST 42 (H) 15 - 41 U/L   ALT 44 0 - 44 U/L   Alkaline Phosphatase 139 (H) 38 - 126 U/L   Total Bilirubin 1.0 0.3 - 1.2 mg/dL   GFR, Estimated >08 >65 mL/min    Comment: (NOTE) Calculated using the CKD-EPI Creatinine Equation (2021)    Anion gap 13 5 - 15    Comment: Performed at [facility], [address] [place], [place] [address]  Salicylate level     Status: Abnormal   Collection Time: 02/21/23 10:44 PM  Result Value Ref Range   Salicylate Lvl <7.0 (L) 7.0 - 30.0 mg/dL    Comment: Performed at [facility], [address] [place], [place] [address]  Acetaminophen level     Status: Abnormal   Collection Time: 02/21/23 10:44 PM  Result Value Ref Range   Acetaminophen (Tylenol), Serum <10 (L) 10 - 30 ug/mL    Comment: (NOTE) Therapeutic concentrations  vary significantly. A range of 10-30 ug/mL  may be an effective concentration for many patients. However, some  are best treated at concentrations outside of this range. Acetaminophen concentrations >150 ug/mL at 4 hours after ingestion  and >50 ug/mL at 12 hours after ingestion are often associated with  toxic reactions.  Performed at [facility], [address] [place], [place] [address]   cbc     Status: Abnormal   Collection Time: 02/21/23 10:44 PM  Result Value Ref Range   WBC 11.5 (H) 4.0 - 10.5 K/uL   RBC 5.41 4.22 - 5.81 MIL/uL   Hemoglobin 14.7 13.0 - 17.0 g/dL   HCT 60.4 54.0 - 98.1 %   MCV 83.4 80.0 - 100.0 fL   MCH 27.2 26.0 - 34.0 pg   MCHC 32.6 30.0 - 36.0 g/dL   RDW 19.1 (H) 47.8 - 29.5 %   Platelets 441 (H) 150 - 400 K/uL   nRBC 0.0 0.0 - 0.2 %    Comment: Performed at [facility], [address] [place], [place] [address]    No current facility-administered medications for this encounter.   Current Outpatient Medications  Medication Sig Dispense Refill  . alum & mag hydroxide-simeth (MAALOX/MYLANTA) 200-200-20 MG/5ML suspension Take 15 mLs by mouth once every 6 (six) hours as needed for indigestion or heartburn. (Patient not taking: Reported on 02/21/2023) 355 mL 0  . Cholecalciferol (VITAMIN D3) 50 MCG (2000 UT) CAPS Take by mouth daily. (Patient not taking: Reported on 06/11/2021)    . folic acid (FOLVITE) 1 MG tablet Take 1 tablet (1 mg total) by mouth once daily.  (Patient not taking: Reported on 02/21/2023) 90 tablet 1  . lidocaine (LIDODERM) 5 % Place 2 patches topically onto the skin once daily. Remove and discard patches within 12 hours or as directed by doctor. (Max of 2 patches in 24 hours). (Patient not taking: Reported on 02/21/2023) 30 patch 0  . Multiple Vitamins-Minerals (MULTIVITAMIN MEN PO) Take by mouth daily. (Patient not taking: Reported on 06/11/2021)    . nadolol (CORGARD) 20 MG tablet Take 1 tablet (20 mg total) by mouth once daily. (Patient not taking: Reported on 02/21/2023) 30 tablet 11  . pantoprazole (PROTONIX) 40 MG tablet Take 1 tablet (40 mg total) by mouth 2 (two) times daily. (Patient not taking: Reported on 02/21/2023) 60 tablet 1  . predniSONE (DELTASONE) 20 MG tablet Take 2 tablets (40 mg total) by mouth once daily with breakfast. (Patient not taking: Reported on 02/21/2023) 40 tablet 0  . thiamine (VITAMIN B-1) 100 MG tablet Take 1 tablet (100 mg total) by mouth once daily. (Patient not taking: Reported on 02/21/2023) 90 tablet 1    Musculoskeletal: Strength & Muscle Tone: {desc; muscle tone:32375} Gait & Station: {PE GAIT ED QMVH:84696} Patient leans: {Patient Leans:[id number]}            Psychiatric Specialty Exam:  Presentation  General Appearance:  Bizarre; Disheveled  Eye Contact: Fair  Speech: Garbled  Speech Volume: Decreased  Handedness: Right   Mood and Affect  Mood: Anxious; Depressed; Worthless; Hopeless  Affect: Congruent   Thought Process  Thought Processes: Coherent  Descriptions of Associations:Intact  Orientation:No data recorded Thought Content:WDL  History of Schizophrenia/Schizoaffective disorder:No data recorded Duration of Psychotic Symptoms:No data recorded Hallucinations:Hallucinations: None  Ideas of Reference:None  Suicidal Thoughts:Suicidal Thoughts: Yes, Active SI Active Intent and/or Plan: With Access to Means  Homicidal Thoughts:Homicidal Thoughts:  No   Sensorium  Memory: Immediate Fair  Judgment: Impaired  Insight: Fair   Chartered certified accountant: Fair  Attention Span: Fair  Recall: [employer] of Knowledge: Fair  Language: Fair   Psychomotor Activity  Psychomotor Activity: Psychomotor Activity: Normal   Assets  Assets: Desire for Improvement; Communication Skills; Housing   Sleep  Sleep: Sleep: Poor   Physical Exam: Physical Exam ROS Blood pressure (!) 133/96, pulse (!) 120, temperature 98.3 F (36.8 C), temperature source Oral, resp. rate 18, height 5\' 8"  (1.727 m), SpO2 99 %. Body mass index is 38.01 kg/m.  Treatment Plan Summary: {CHL [facility] MD [place] ZOXW:[id number]}  Disposition: {CHL [facility] Consult JXBJ:47829}  Jearld Lesch, NP 02/21/2023 11:45 PM

## 2023-02-21 NOTE — ED Notes (Addendum)
Pt dressed out:  Black shoes Black pants Red shirt Cell phone Blue underwear

## 2023-02-21 NOTE — ED Triage Notes (Signed)
FIRST NURSE NOTE:  Pt arrived VOL with [employer], per officer, pt admits to drinking about 12-13 beers and made statement to officer about wanting to die because we are all going to die anyways.  Pt calm and cooperative on arrival, steady gait.

## 2023-02-22 DIAGNOSIS — F331 Major depressive disorder, recurrent, moderate: Secondary | ICD-10-CM

## 2023-02-22 DIAGNOSIS — R45851 Suicidal ideations: Secondary | ICD-10-CM

## 2023-02-22 LAB — URINE DRUG SCREEN, QUALITATIVE (ARMC ONLY)
Amphetamines, Ur Screen: NOT DETECTED
Barbiturates, Ur Screen: NOT DETECTED
Benzodiazepine, Ur Scrn: NOT DETECTED
Cannabinoid 50 Ng, Ur ~~LOC~~: NOT DETECTED
Cocaine Metabolite,Ur ~~LOC~~: NOT DETECTED
MDMA (Ecstasy)Ur Screen: NOT DETECTED
Methadone Scn, Ur: NOT DETECTED
Opiate, Ur Screen: NOT DETECTED
Phencyclidine (PCP) Ur S: NOT DETECTED
Tricyclic, Ur Screen: NOT DETECTED

## 2023-02-22 LAB — ETHANOL: Alcohol, Ethyl (B): 380 mg/dL (ref <10)

## 2023-02-22 MED ORDER — ONDANSETRON 4 MG PO TBDP
4.0000 mg | ORAL_TABLET | Freq: Once | ORAL | Status: AC
  Administered 2023-02-22: 4 mg via ORAL
  Filled 2023-02-22: qty 1

## 2023-02-22 NOTE — ED Notes (Signed)
Katrinka Blazing, MD notified of ethanol 380

## 2023-02-22 NOTE — ED Provider Notes (Signed)
Vitals:   02/21/23 2242 02/22/23 0616  BP: (!) 133/96 (!) 130/96  Pulse: (!) 120 (!) 108  Resp: 18 16  Temp: 98.3 F (36.8 C) 98.3 F (36.8 C)  SpO2: 99% 97%    Patient fully alert oriented very pleasant.  He reports that he drank quite heavily last night, this is spurred on by the loss of his mother which has caused [place] to alcohol abuse in the past.  He does not want to harm himself or anyone else.  Denies suicidal ideation, reports that the alcohol had spurred that feeling.  He had sobriety for about 15 months.  He follows with [facility] and is ready and willing to reengage them.  He will follow-up with [facility] and Dr. Georjean Mode  Return precautions and treatment recommendations and follow-up discussed with the patient who is agreeable with the plan.  He is not driving himself.   Sharyn Creamer, MD 02/22/23 [phone]

## 2023-02-22 NOTE — ED Notes (Signed)
Pt given breakfast tray

## 2023-02-22 NOTE — ED Notes (Signed)
VOL/re-assess in the am

## 2023-02-22 NOTE — ED Notes (Addendum)
Pt given belongings. Given phone to call for ride.

## 2023-02-22 NOTE — Progress Notes (Signed)
Patient is currently being evaluated by psychiatry. Substance abuse resources added to patients AVS.

## 2023-02-22 NOTE — ED Notes (Signed)
Patient given [place] sandwich box and ginger ale per request.

## 2023-02-22 NOTE — ED Notes (Signed)
PT stated he was hungry and has not ate in 3 days. Provided pt a dinner tray w/sandwich and chips.

## 2023-03-01 ENCOUNTER — Inpatient Hospital Stay
Admission: EM | Admit: 2023-03-01 | Discharge: 2023-03-03 | DRG: 897 | Disposition: A | Discharge status: Home / Self Care | Payer: MEDICAID | Attending: Obstetrics and Gynecology | Admitting: Obstetrics and Gynecology

## 2023-03-01 ENCOUNTER — Other Ambulatory Visit: Payer: Self-pay

## 2023-03-01 DIAGNOSIS — Z8659 Personal history of other mental and behavioral disorders: Secondary | ICD-10-CM | POA: Diagnosis not present

## 2023-03-01 DIAGNOSIS — F10939 Alcohol use, unspecified with withdrawal, unspecified: Secondary | ICD-10-CM | POA: Diagnosis not present

## 2023-03-01 DIAGNOSIS — F419 Anxiety disorder, unspecified: Secondary | ICD-10-CM | POA: Diagnosis present

## 2023-03-01 DIAGNOSIS — I8500 Esophageal varices without bleeding: Secondary | ICD-10-CM | POA: Diagnosis not present

## 2023-03-01 DIAGNOSIS — I8510 Secondary esophageal varices without bleeding: Secondary | ICD-10-CM | POA: Diagnosis present

## 2023-03-01 DIAGNOSIS — Z560 Unemployment, unspecified: Secondary | ICD-10-CM

## 2023-03-01 DIAGNOSIS — K7030 Alcoholic cirrhosis of liver without ascites: Secondary | ICD-10-CM | POA: Diagnosis present

## 2023-03-01 DIAGNOSIS — E876 Hypokalemia: Secondary | ICD-10-CM | POA: Diagnosis present

## 2023-03-01 DIAGNOSIS — K219 Gastro-esophageal reflux disease without esophagitis: Secondary | ICD-10-CM

## 2023-03-01 DIAGNOSIS — Y902 Blood alcohol level of 40-59 mg/100 ml: Secondary | ICD-10-CM | POA: Diagnosis present

## 2023-03-01 DIAGNOSIS — Z5941 Food insecurity: Secondary | ICD-10-CM | POA: Diagnosis not present

## 2023-03-01 DIAGNOSIS — F10230 Alcohol dependence with withdrawal, uncomplicated: Principal | ICD-10-CM | POA: Diagnosis present

## 2023-03-01 DIAGNOSIS — R197 Diarrhea, unspecified: Secondary | ICD-10-CM | POA: Diagnosis present

## 2023-03-01 DIAGNOSIS — Z87891 Personal history of nicotine dependence: Secondary | ICD-10-CM

## 2023-03-01 DIAGNOSIS — F32A Depression, unspecified: Secondary | ICD-10-CM | POA: Diagnosis present

## 2023-03-01 DIAGNOSIS — F1010 Alcohol abuse, uncomplicated: Secondary | ICD-10-CM | POA: Diagnosis not present

## 2023-03-01 DIAGNOSIS — F10930 Alcohol use, unspecified with withdrawal, uncomplicated: Secondary | ICD-10-CM | POA: Diagnosis not present

## 2023-03-01 DIAGNOSIS — Z801 Family history of malignant neoplasm of trachea, bronchus and lung: Secondary | ICD-10-CM

## 2023-03-01 DIAGNOSIS — F10932 Alcohol use, unspecified with withdrawal with perceptual disturbance: Secondary | ICD-10-CM | POA: Diagnosis not present

## 2023-03-01 DIAGNOSIS — K709 Alcoholic liver disease, unspecified: Secondary | ICD-10-CM | POA: Diagnosis present

## 2023-03-01 LAB — URINE DRUG SCREEN, QUALITATIVE (ARMC ONLY)
Amphetamines, Ur Screen: NOT DETECTED
Barbiturates, Ur Screen: NOT DETECTED
Benzodiazepine, Ur Scrn: NOT DETECTED
Cannabinoid 50 Ng, Ur ~~LOC~~: NOT DETECTED
Cocaine Metabolite,Ur ~~LOC~~: NOT DETECTED
MDMA (Ecstasy)Ur Screen: NOT DETECTED
Methadone Scn, Ur: NOT DETECTED
Opiate, Ur Screen: NOT DETECTED
Phencyclidine (PCP) Ur S: NOT DETECTED
Tricyclic, Ur Screen: NOT DETECTED

## 2023-03-01 LAB — BASIC METABOLIC PANEL
Anion gap: 10 (ref 5–15)
BUN: 11 mg/dL (ref 6–20)
CO2: 28 mmol/L (ref 22–32)
Calcium: 8.7 mg/dL — ABNORMAL LOW (ref 8.9–10.3)
Chloride: 96 mmol/L — ABNORMAL LOW (ref 98–111)
Creatinine, Ser: 0.96 mg/dL (ref 0.61–1.24)
GFR, Estimated: >60 mL/min (ref >60)
Glucose, Bld: 101 mg/dL — ABNORMAL HIGH (ref 70–99)
Potassium: 3.3 mmol/L — ABNORMAL LOW (ref 3.5–5.1)
Sodium: 134 mmol/L — ABNORMAL LOW (ref 135–145)

## 2023-03-01 LAB — CBC
HCT: 40.7 % (ref 39.0–52.0)
Hemoglobin: 13.3 g/dL (ref 13.0–17.0)
MCH: 26.8 pg (ref 26.0–34.0)
MCHC: 32.7 g/dL (ref 30.0–36.0)
MCV: 82.1 fL (ref 80.0–100.0)
Platelets: 189 10*3/uL (ref 150–400)
RBC: 4.96 MIL/uL (ref 4.22–5.81)
RDW: 16.6 % — ABNORMAL HIGH (ref 11.5–15.5)
WBC: 11.2 10*3/uL — ABNORMAL HIGH (ref 4.0–10.5)
nRBC: 0 % (ref 0.0–0.2)

## 2023-03-01 LAB — COMPREHENSIVE METABOLIC PANEL
ALT: 48 U/L — ABNORMAL HIGH (ref 0–44)
AST: 53 U/L — ABNORMAL HIGH (ref 15–41)
Albumin: 4 g/dL (ref 3.5–5.0)
Alkaline Phosphatase: 124 U/L (ref 38–126)
Anion gap: 15 (ref 5–15)
BUN: 12 mg/dL (ref 6–20)
CO2: 24 mmol/L (ref 22–32)
Calcium: 8.7 mg/dL — ABNORMAL LOW (ref 8.9–10.3)
Chloride: 91 mmol/L — ABNORMAL LOW (ref 98–111)
Creatinine, Ser: 0.73 mg/dL (ref 0.61–1.24)
GFR, Estimated: >60 mL/min (ref >60)
Glucose, Bld: 132 mg/dL — ABNORMAL HIGH (ref 70–99)
Potassium: 2.8 mmol/L — ABNORMAL LOW (ref 3.5–5.1)
Sodium: 130 mmol/L — ABNORMAL LOW (ref 135–145)
Total Bilirubin: 1 mg/dL (ref 0.3–1.2)
Total Protein: 6.8 g/dL (ref 6.5–8.1)

## 2023-03-01 LAB — SALICYLATE LEVEL: Salicylate Lvl: <7 mg/dL — ABNORMAL LOW (ref 7.0–30.0)

## 2023-03-01 LAB — ETHANOL: Alcohol, Ethyl (B): 40 mg/dL — ABNORMAL HIGH (ref <10)

## 2023-03-01 LAB — PROTIME-INR
INR: 1.3 — ABNORMAL HIGH (ref 0.8–1.2)
Prothrombin Time: 15.9 seconds — ABNORMAL HIGH (ref 11.4–15.2)

## 2023-03-01 LAB — ACETAMINOPHEN LEVEL: Acetaminophen (Tylenol), Serum: <10 ug/mL — ABNORMAL LOW (ref 10–30)

## 2023-03-01 MED ORDER — NADOLOL 20 MG PO TABS
20.0000 mg | ORAL_TABLET | Freq: Every day | ORAL | Status: DC
  Administered 2023-03-01 – 2023-03-03 (×3): 20 mg via ORAL
  Filled 2023-03-01 (×3): qty 1

## 2023-03-01 MED ORDER — THIAMINE HCL 100 MG/ML IJ SOLN
100.0000 mg | Freq: Every day | INTRAMUSCULAR | Status: DC
  Administered 2023-03-03: 100 mg via INTRAVENOUS
  Filled 2023-03-01: qty 2

## 2023-03-01 MED ORDER — THIAMINE HCL 100 MG/ML IJ SOLN
100.0000 mg | Freq: Once | INTRAMUSCULAR | Status: AC
  Administered 2023-03-01: 100 mg via INTRAVENOUS
  Filled 2023-03-01: qty 2

## 2023-03-01 MED ORDER — FOLIC ACID 1 MG PO TABS
1.0000 mg | ORAL_TABLET | Freq: Every day | ORAL | Status: DC
  Administered 2023-03-01 – 2023-03-03 (×3): 1 mg via ORAL
  Filled 2023-03-01 (×3): qty 1

## 2023-03-01 MED ORDER — ADULT MULTIVITAMIN W/MINERALS CH
1.0000 | ORAL_TABLET | Freq: Every day | ORAL | Status: DC
  Administered 2023-03-01 – 2023-03-03 (×3): 1 via ORAL
  Filled 2023-03-01 (×3): qty 1

## 2023-03-01 MED ORDER — ENOXAPARIN SODIUM 60 MG/0.6ML IJ SOSY
55.0000 mg | PREFILLED_SYRINGE | INTRAMUSCULAR | Status: DC
  Administered 2023-03-01 – 2023-03-02 (×2): 55 mg via SUBCUTANEOUS
  Filled 2023-03-01 (×2): qty 0.6

## 2023-03-01 MED ORDER — CHLORDIAZEPOXIDE HCL 25 MG PO CAPS
50.0000 mg | ORAL_CAPSULE | Freq: Once | ORAL | Status: AC
  Administered 2023-03-01: 50 mg via ORAL
  Filled 2023-03-01: qty 2

## 2023-03-01 MED ORDER — MAGNESIUM SULFATE 2 GM/50ML IV SOLN
2.0000 g | Freq: Once | INTRAVENOUS | Status: AC
  Administered 2023-03-01: 2 g via INTRAVENOUS
  Filled 2023-03-01: qty 50

## 2023-03-01 MED ORDER — SODIUM CHLORIDE 0.9 % IV SOLN: INTRAVENOUS | Status: DC | PRN

## 2023-03-01 MED ORDER — SODIUM CHLORIDE 0.9 % IV BOLUS
1000.0000 mL | Freq: Once | INTRAVENOUS | Status: AC
  Administered 2023-03-01: 1000 mL via INTRAVENOUS

## 2023-03-01 MED ORDER — POTASSIUM CHLORIDE 10 MEQ/100ML IV SOLN
10.0000 meq | INTRAVENOUS | Status: AC
  Administered 2023-03-01 (×2): 10 meq via INTRAVENOUS
  Filled 2023-03-01 (×2): qty 100

## 2023-03-01 MED ORDER — ONDANSETRON HCL 4 MG/2ML IJ SOLN
4.0000 mg | Freq: Four times a day (QID) | INTRAMUSCULAR | Status: DC | PRN
  Administered 2023-03-01: 4 mg via INTRAVENOUS
  Filled 2023-03-01: qty 2

## 2023-03-01 MED ORDER — LORAZEPAM 1 MG PO TABS
1.0000 mg | ORAL_TABLET | ORAL | Status: DC | PRN
  Administered 2023-03-02 (×2): 1 mg via ORAL
  Administered 2023-03-03: 2 mg via ORAL
  Filled 2023-03-01: qty 1
  Filled 2023-03-01: qty 2
  Filled 2023-03-01: qty 1

## 2023-03-01 MED ORDER — ONDANSETRON HCL 4 MG PO TABS: 4.0000 mg | ORAL_TABLET | Freq: Four times a day (QID) | ORAL | Status: DC | PRN

## 2023-03-01 MED ORDER — POTASSIUM CHLORIDE CRYS ER 20 MEQ PO TBCR
40.0000 meq | EXTENDED_RELEASE_TABLET | Freq: Once | ORAL | Status: AC
  Administered 2023-03-01: 40 meq via ORAL
  Filled 2023-03-01: qty 2

## 2023-03-01 MED ORDER — LORAZEPAM 2 MG/ML IJ SOLN
1.0000 mg | INTRAMUSCULAR | Status: DC | PRN
  Administered 2023-03-01 – 2023-03-02 (×3): 2 mg via INTRAVENOUS
  Filled 2023-03-01 (×3): qty 1

## 2023-03-01 MED ORDER — LORAZEPAM 2 MG/ML IJ SOLN
1.0000 mg | Freq: Once | INTRAMUSCULAR | Status: DC
  Filled 2023-03-01: qty 1

## 2023-03-01 MED ORDER — PANTOPRAZOLE SODIUM 40 MG PO TBEC
40.0000 mg | DELAYED_RELEASE_TABLET | Freq: Two times a day (BID) | ORAL | Status: DC
  Administered 2023-03-01 – 2023-03-03 (×5): 40 mg via ORAL
  Filled 2023-03-01 (×5): qty 1

## 2023-03-01 MED ORDER — LORAZEPAM 2 MG/ML IJ SOLN
2.0000 mg | Freq: Once | INTRAMUSCULAR | Status: AC
  Administered 2023-03-01: 2 mg via INTRAVENOUS
  Filled 2023-03-01: qty 1

## 2023-03-01 MED ORDER — THIAMINE MONONITRATE 100 MG PO TABS
100.0000 mg | ORAL_TABLET | Freq: Every day | ORAL | Status: DC
  Administered 2023-03-02: 100 mg via ORAL
  Filled 2023-03-01 (×2): qty 1

## 2023-03-01 NOTE — ED Notes (Signed)
Advised nurse that patient has ready bed 

## 2023-03-01 NOTE — Consult Note (Signed)
[facility] Face-to-Face Psychiatry Consult   Reason for Consult:  Psych Evaluation Referring Physician:  Dr. Alvester Morin Patient Identification: Chad Walsh MRN:  [id number] Principal Diagnosis: Alcohol withdrawal (HCC) Diagnosis:  Principal Problem:   Alcohol withdrawal (HCC) Active Problems:   Depression   Hypokalemia   Alcoholic liver disease (HCC)   Esophageal varices in alcoholic cirrhosis (HCC)   Total Time spent with patient:   Subjective:   Depression and alcohol detox  HPI:  [facility] Consult Psych Reassessment  Chad Walsh, 42 y.o., Yr. old male patient at [facility]  this provider.  Chart reviewed and discussed with Dr. Alvester Morin on 03/01/23.     On evaluation QUINTEZ SOBIECK reports that he is feeling much better since recovering from his alcohol binge the other day.  He states that while being in the hospital, he has been able to clear his mind.  He admits to some depression but states its due to losing his job and now possibly his apartment.  However, he was able to state some of the ways in which he can overcome his situation, ie, calling his job to see if they will  rehire him (he said they really liked him), going to food banks for food, and also going to DSS for assistance.    Patient states that he will continue follow-up with [facility] on discharge for substance abuse counseling.  During evaluation Chad Walsh is alert/oriented x 4; calm/cooperative; and mood is congruent with affect.  He does not appear to be responding to internal/external stimuli or delusional thoughts.  Patient denies suicidal/self-harm/homicidal ideation, psychosis, and paranoia.  Patient answered question appropriately.    Disposition:  Patient psychiatrically cleared   Past Psychiatric History: ETOH abuse, MDD, anxiety  Risk to Self:   Risk to Others:   Prior Inpatient Therapy:   Prior Outpatient Therapy:    Past Medical History:  Past Medical History:  Diagnosis Date    Anxiety    Depression    ETOH abuse    GERD (gastroesophageal reflux disease)     Past Surgical History:  Procedure Laterality Date   ESOPHAGOGASTRODUODENOSCOPY (EGD) WITH PROPOFOL N/A 11/13/2021   Procedure: ESOPHAGOGASTRODUODENOSCOPY (EGD) WITH PROPOFOL;  Surgeon: Toney Reil, MD;  Location: [facility] ENDOSCOPY;  Service: Gastroenterology;  Laterality: N/A;   ORIF ANKLE FRACTURE Left 09/27/2020   Procedure: OPEN REDUCTION INTERNAL FIXATION (ORIF) ANKLE FRACTURE, SYNDOSIS REPAIR;  Surgeon: Signa Kell, MD;  Location: [facility] ORS;  Service: Orthopedics;  Laterality: Left;   TONSILLECTOMY     WISDOM TOOTH EXTRACTION     Family History:  Family History  Problem Relation Age of Onset   Lung cancer Mother    Family Psychiatric  History: Unknown Social History:  Social History   Substance and Sexual Activity  Alcohol Use Yes   Alcohol/week: 28.0 standard drinks of alcohol   Types: 28 Standard drinks or equivalent per week   Comment: 09/25/20 - pt reports no alcohol last 2 weeks     Social History   Substance and Sexual Activity  Drug Use Not Currently   Types: Marijuana   Comment: early 2021 last used    Social History   Socioeconomic History   Marital status: Single    Spouse name: Not on file   Number of children: Not on file   Years of education: Not on file   Highest education level: Not on file  Occupational History   Not on file  Tobacco Use   Smoking status: Former  Packs/day: 0.50    Years: 18.00    Additional pack years: 0.00    Total pack years: 9.00    Types: Cigarettes    Quit date: 03/25/2020    Years since quitting: 2.9   Smokeless tobacco: Never  Vaping Use   Vaping Use: Never used  Substance and Sexual Activity   Alcohol use: Yes    Alcohol/week: 28.0 standard drinks of alcohol    Types: 28 Standard drinks or equivalent per week    Comment: 09/25/20 - pt reports no alcohol last 2 weeks   Drug use: Not Currently    Types: Marijuana    Comment:  early 2021 last used   Sexual activity: Not on file  Other Topics Concern   Not on file  Social History Narrative   Not on file   Social Determinants of Health   Financial Resource Strain: Not on file  Food Insecurity: Food Insecurity Present (03/01/2023)   Hunger Vital Sign    Worried About Running Out of Food in the Last Year: Sometimes true    Ran Out of Food in the Last Year: Sometimes true  Transportation Needs: No Transportation Needs (03/01/2023)   PRAPARE - Administrator, Civil Service (Medical): No    Lack of Transportation (Non-Medical): No  Physical Activity: Not on file  Stress: Not on file  Social Connections: Not on file   Additional Social History:    Allergies:  No Known Allergies  Labs:  Results for orders placed or performed during the hospital encounter of 03/01/23 (from the past 48 hour(s))  Comprehensive metabolic panel     Status: Abnormal   Collection Time: 03/01/23  7:30 AM  Result Value Ref Range   Sodium 130 (L) 135 - 145 mmol/L   Potassium 2.8 (L) 3.5 - 5.1 mmol/L   Chloride 91 (L) 98 - 111 mmol/L   CO2 24 22 - 32 mmol/L   Glucose, Bld 132 (H) 70 - 99 mg/dL    Comment: Glucose reference range applies only to samples taken after fasting for at least 8 hours.   BUN 12 6 - 20 mg/dL   Creatinine, Ser 1.61 0.61 - 1.24 mg/dL   Calcium 8.7 (L) 8.9 - 10.3 mg/dL   Total Protein 6.8 6.5 - 8.1 g/dL   Albumin 4.0 3.5 - 5.0 g/dL   AST 53 (H) 15 - 41 U/L   ALT 48 (H) 0 - 44 U/L   Alkaline Phosphatase 124 38 - 126 U/L   Total Bilirubin 1.0 0.3 - 1.2 mg/dL   GFR, Estimated >09 >60 mL/min    Comment: (NOTE) Calculated using the CKD-EPI Creatinine Equation (2021)    Anion gap 15 5 - 15    Comment: Performed at [facility], [address] [place], [place] [address]  Ethanol     Status: Abnormal   Collection Time: 03/01/23  7:30 AM  Result Value Ref Range   Alcohol, Ethyl (B) 40 (H) <10 mg/dL    Comment: (NOTE) Lowest detectable  limit for serum alcohol is 10 mg/dL.  For medical purposes only. Performed at [facility], [address] [place], [place] [address]   Salicylate level     Status: Abnormal   Collection Time: 03/01/23  7:30 AM  Result Value Ref Range   Salicylate Lvl <7.0 (L) 7.0 - 30.0 mg/dL    Comment: Performed at [facility], [address], [place], [place] [address]  Acetaminophen level  Status: Abnormal   Collection Time: 03/01/23  7:30 AM  Result Value Ref Range   Acetaminophen (Tylenol), Serum <10 (L) 10 - 30 ug/mL    Comment: (NOTE) Therapeutic concentrations vary significantly. A range of 10-30 ug/mL  may be an effective concentration for many patients. However, some  are best treated at concentrations outside of this range. Acetaminophen concentrations >150 ug/mL at 4 hours after ingestion  and >50 ug/mL at 12 hours after ingestion are often associated with  toxic reactions.  Performed at [facility], [address] [place], [place] [address]   cbc     Status: Abnormal   Collection Time: 03/01/23  7:30 AM  Result Value Ref Range   WBC 11.2 (H) 4.0 - 10.5 K/uL   RBC 4.96 4.22 - 5.81 MIL/uL   Hemoglobin 13.3 13.0 - 17.0 g/dL   HCT 60.4 54.0 - 98.1 %   MCV 82.1 80.0 - 100.0 fL   MCH 26.8 26.0 - 34.0 pg   MCHC 32.7 30.0 - 36.0 g/dL   RDW 19.1 (H) 47.8 - 29.5 %   Platelets 189 150 - 400 K/uL   nRBC 0.0 0.0 - 0.2 %    Comment: Performed at [facility], [address] [place], [place] [address]  Urine Drug Screen, Qualitative     Status: None   Collection Time: 03/01/23  8:40 AM  Result Value Ref Range   Tricyclic, Ur Screen NONE DETECTED NONE DETECTED   Amphetamines, Ur Screen NONE DETECTED NONE DETECTED   MDMA (Ecstasy)Ur Screen NONE DETECTED NONE DETECTED   Cocaine Metabolite,Ur [place] NONE DETECTED NONE DETECTED   Opiate, Ur Screen NONE DETECTED NONE DETECTED   Phencyclidine (PCP) Ur S NONE DETECTED NONE DETECTED   Cannabinoid 50  Ng, Ur [place] NONE DETECTED NONE DETECTED   Barbiturates, Ur Screen NONE DETECTED NONE DETECTED   Benzodiazepine, Ur Scrn NONE DETECTED NONE DETECTED   Methadone Scn, Ur NONE DETECTED NONE DETECTED    Comment: (NOTE) Tricyclics + metabolites, urine    Cutoff 1000 ng/mL Amphetamines + metabolites, urine  Cutoff 1000 ng/mL MDMA (Ecstasy), urine              Cutoff 500 ng/mL Cocaine Metabolite, urine          Cutoff 300 ng/mL Opiate + metabolites, urine        Cutoff 300 ng/mL Phencyclidine (PCP), urine         Cutoff 25 ng/mL Cannabinoid, urine                 Cutoff 50 ng/mL Barbiturates + metabolites, urine  Cutoff 200 ng/mL Benzodiazepine, urine              Cutoff 200 ng/mL Methadone, urine                   Cutoff 300 ng/mL  The urine drug screen provides only a preliminary, unconfirmed analytical test result and should not be used for non-medical purposes. Clinical consideration and professional judgment should be applied to any positive drug screen result due to possible interfering substances. A more specific alternate chemical method must be used in order to obtain a confirmed analytical result. Gas chromatography / mass spectrometry (GC/MS) is the preferred confirm atory method. Performed at [facility], [address] [place], [place] [address]     Current Facility-Administered Medications  Medication Dose Route Frequency Provider Last Rate Last Admin   enoxaparin (LOVENOX) injection 55 mg  55 mg  Subcutaneous Q24H Floydene Flock, MD       folic acid (FOLVITE) tablet 1 mg  1 mg Oral Daily Floydene Flock, MD   1 mg at 03/01/23 1230   LORazepam (ATIVAN) injection 1 mg  1 mg Intravenous Once Shaune Pollack, MD       LORazepam (ATIVAN) tablet 1-4 mg  1-4 mg Oral Q1H PRN Floydene Flock, MD       Or   LORazepam (ATIVAN) injection 1-4 mg  1-4 mg Intravenous Q1H PRN Floydene Flock, MD       multivitamin with minerals tablet 1 tablet  1 tablet Oral Daily Floydene Flock, MD   1 tablet at 03/01/23 1230   nadolol (CORGARD) tablet 20 mg  20 mg Oral Daily Floydene Flock, MD   20 mg at 03/01/23 1353   ondansetron (ZOFRAN) tablet 4 mg  4 mg Oral Q6H PRN Floydene Flock, MD       Or   ondansetron [facility]) injection 4 mg  4 mg Intravenous Q6H PRN Floydene Flock, MD       pantoprazole (PROTONIX) EC tablet 40 mg  40 mg Oral BID Floydene Flock, MD   40 mg at 03/01/23 1354   thiamine (VITAMIN B1) tablet 100 mg  100 mg Oral Daily Floydene Flock, MD       Or   thiamine (VITAMIN B1) injection 100 mg  100 mg Intravenous Daily Floydene Flock, MD        Musculoskeletal: Strength & Muscle Tone: within normal limits Gait & Station: normal Patient leans: N/A   Psychiatric Specialty Exam:  Presentation  General Appearance:  Bizarre; Disheveled  Eye Contact: Fair  Speech: Garbled  Speech Volume: Decreased  Handedness: Right   Mood and Affect  Mood: Anxious; Depressed; Worthless; Hopeless  Affect: Congruent   Thought Process  Thought Processes: Coherent  Descriptions of Associations:Intact  Orientation:No data recorded Thought Content:WDL  History of Schizophrenia/Schizoaffective disorder:No data recorded Duration of Psychotic Symptoms:No data recorded Hallucinations:No data recorded Ideas of Reference:None  Suicidal Thoughts:No data recorded Homicidal Thoughts:No data recorded  Sensorium  Memory: Immediate Fair  Judgment: Impaired  Insight: Fair   Chartered certified accountant: Fair  Attention Span: Fair  Recall: [employer] of Knowledge: Fair  Language: Fair   Psychomotor Activity  Psychomotor Activity:No data recorded  Assets  Assets: Desire for Improvement; Communication Skills; Housing   Sleep  Sleep:No data recorded  Physical Exam: Physical Exam Vitals and nursing note reviewed.  Constitutional:      Appearance: Normal appearance.  HENT:     Head: Normocephalic and  atraumatic.     Nose: Nose normal.  Eyes:     Pupils: Pupils are equal, round, and reactive to light.  Pulmonary:     Effort: Pulmonary effort is normal.  Abdominal:     General: Abdomen is flat.  Musculoskeletal:        General: Normal range of motion.     Cervical back: Normal range of motion.  Skin:    General: Skin is dry.  Neurological:     Mental Status: He is alert and oriented to person, place, and time.  Psychiatric:        Attention and Perception: Attention and perception normal.        Mood and Affect: Mood and affect normal.        Speech: Speech normal.        Behavior: Behavior normal.  Behavior is cooperative.        Thought Content: Thought content normal. Thought content does not include suicidal ideation. Thought content does not include suicidal plan.        Cognition and Memory: Cognition and memory normal.        Judgment: Judgment normal.    Review of Systems  Psychiatric/Behavioral:  Positive for depression and substance abuse. Negative for hallucinations and suicidal ideas.   All other systems reviewed and are negative.  Blood pressure (!) 171/105, pulse 91, temperature 98.3 F (36.8 C), resp. rate 18, height 5\' 8"  (1.727 m), weight 113.4 kg, SpO2 99 %. Body mass index is 38.01 kg/m.    Disposition: No evidence of imminent risk to self or others at present.   Patient does not meet criteria for psychiatric inpatient admission. Discussed crisis plan, support from social network, calling 911, coming to the Emergency Department, and calling Suicide Hotline.  Jearld Lesch, NP 03/01/2023 3:34 PM

## 2023-03-01 NOTE — Assessment & Plan Note (Addendum)
AST 53, ALT 48 in setting of ETOH use  + cirrhotic disease on prior abd u/s  INR pending Trend LFTs with treatment

## 2023-03-01 NOTE — Assessment & Plan Note (Addendum)
Heavy ETOH use including 1/5th E&J daily with patient "stepping down" to white claw 10% ETOH beers 10+ per day  ETOH level 40  + tremor, agitation, audio/visual hallucinations Will start on CIWA protocol  Low threshold for precedex gtt  Will also consult psych as pt does report remote SI, though none today

## 2023-03-01 NOTE — Assessment & Plan Note (Addendum)
Baseline depression history of suicidal ideation with noted concurrent alcohol withdrawals Noted recent evaluation June 30 for possible suicidal ideation in the setting of his mother recently passing away Positive auditory and visual hallucinations Will consult psych for further evaluation in the setting of alcohol withdrawals Follow

## 2023-03-01 NOTE — ED Triage Notes (Signed)
First Nurse Note:  Pt via [facility] from home. Pt c/o alcohol withdrawal, NV and tremors that has been going on a week. States he has been trying to detox slowly. States that last drink was last night. Pt is A&Ox4 and NAD.  174/100 BP  129 CBG  100 ST  98% on RA  EMS gave 4mg  of Zofran IM to R deltoid.

## 2023-03-01 NOTE — ED Provider Notes (Signed)
[facility] Provider Note    Event Date/Time   First MD Initiated Contact with Patient 03/01/23 [phone]     (approximate)   History   Alcohol Intoxication   HPI  Chad Walsh is a 42 y.o. male  here with alcohol withdrawal. Pt reports a long h/o EtOH abuse, h/o withdrawals. He recently has been binging for 1-2 months after a relapse. Reports he began to try to cut back this week and particularly over the last 2-3 days.  He has since had increasing shakes, tremors, nausea. Has not been able to eat/'drink for 48 hours or so. He has not had any known seizures. He has a history of w/d but denies known h/o DTs. Denies hallucinations though he does feel like things have been "a bit fuzzy" today.       Physical Exam   Triage Vital Signs: ED Triage Vitals  Enc Vitals Group     BP 03/01/23 0726 (!) 138/94     Pulse Rate 03/01/23 0726 97     Resp 03/01/23 0726 20     Temp 03/01/23 0726 98.8 F (37.1 C)     Temp Source 03/01/23 0726 Oral     SpO2 03/01/23 0726 98 %     Weight 03/01/23 0727 250 lb (113.4 kg)     Height 03/01/23 0727 5\' 8"  (1.727 m)     Head Circumference --      Peak Flow --      Pain Score 03/01/23 0727 0     Pain Loc --      Pain Edu? --      Excl. in GC? --     Most recent vital signs: Vitals:   03/01/23 1100 03/01/23 1227  BP: (!) 143/94 (!) 158/100  Pulse: 96 96  Resp: 13   Temp:    SpO2: 94%      General: Awake, no distress.  CV:  Good peripheral perfusion. Mild tachycardia, no murmurs. Resp:  Normal work of breathing. Lungs clear. Abd:  No distention. No tenderness. Other:  Slight tremor noted, anxious.    ED Results / Procedures / Treatments   Labs (all labs ordered are listed, but only abnormal results are displayed) Labs Reviewed  COMPREHENSIVE METABOLIC PANEL - Abnormal; Notable for the following components:      Result Value   Sodium 130 (*)    Potassium 2.8 (*)    Chloride 91 (*)    Glucose, Bld 132  (*)    Calcium 8.7 (*)    AST 53 (*)    ALT 48 (*)    All other components within normal limits  ETHANOL - Abnormal; Notable for the following components:   Alcohol, Ethyl (B) 40 (*)    All other components within normal limits  SALICYLATE LEVEL - Abnormal; Notable for the following components:   Salicylate Lvl <7.0 (*)    All other components within normal limits  ACETAMINOPHEN LEVEL - Abnormal; Notable for the following components:   Acetaminophen (Tylenol), Serum <10 (*)    All other components within normal limits  CBC - Abnormal; Notable for the following components:   WBC 11.2 (*)    RDW 16.6 (*)    All other components within normal limits  URINE DRUG SCREEN, QUALITATIVE ([facility] ONLY)  HIV ANTIBODY (ROUTINE TESTING W REFLEX)  BASIC METABOLIC PANEL  PROTIME-INR     EKG Normal sinus rhythm, VR 100. PR 146, QRS 76, QTc 425. No acute ST  elevaitons or depresisons. No ischemia or infarct.   RADIOLOGY None   I also independently reviewed and agree with radiologist interpretations.   PROCEDURES:  Critical Care performed: Yes, see critical care procedure note(s)  .Critical Care  Performed by: Shaune Pollack, MD Authorized by: Shaune Pollack, MD   Critical care provider statement:    Critical care time (minutes):  30   Critical care time was exclusive of:  Separately billable procedures and treating other patients   Critical care was necessary to treat or prevent imminent or life-threatening deterioration of the following conditions:  Cardiac failure, circulatory failure, respiratory failure and CNS failure or compromise   Critical care was time spent personally by me on the following activities:  Development of treatment plan with patient or surrogate, discussions with consultants, evaluation of patient's response to treatment, examination of patient, ordering and review of laboratory studies, ordering and review of radiographic studies, ordering and performing  treatments and interventions, pulse oximetry, re-evaluation of patient's condition and review of old charts     MEDICATIONS ORDERED IN ED: Medications  LORazepam (ATIVAN) injection 1 mg (1 mg Intravenous Not Given 03/01/23 1228)  enoxaparin (LOVENOX) injection 55 mg (has no administration in time range)  ondansetron (ZOFRAN) tablet 4 mg (has no administration in time range)    Or  ondansetron (ZOFRAN) injection 4 mg (has no administration in time range)  LORazepam (ATIVAN) tablet 1-4 mg (has no administration in time range)    Or  LORazepam (ATIVAN) injection 1-4 mg (has no administration in time range)  thiamine (VITAMIN B1) tablet 100 mg (100 mg Oral Not Given 03/01/23 1219)    Or  thiamine (VITAMIN B1) injection 100 mg ( Intravenous See Alternative 03/01/23 1219)  folic acid (FOLVITE) tablet 1 mg (1 mg Oral Given 03/01/23 1230)  multivitamin with minerals tablet 1 tablet (1 tablet Oral Given 03/01/23 1230)  pantoprazole (PROTONIX) EC tablet 40 mg (has no administration in time range)  nadolol (CORGARD) tablet 20 mg (has no administration in time range)  LORazepam (ATIVAN) injection 2 mg (2 mg Intravenous Given 03/01/23 0901)  sodium chloride 0.9 % bolus 1,000 mL (0 mLs Intravenous Stopped 03/01/23 1039)  thiamine (VITAMIN B1) injection 100 mg (100 mg Intravenous Given 03/01/23 0902)  potassium chloride SA (KLOR-CON M) CR tablet 40 mEq (40 mEq Oral Given 03/01/23 0901)  potassium chloride 10 mEq in 100 mL IVPB (0 mEq Intravenous Stopped 03/01/23 1219)  magnesium sulfate IVPB 2 g 50 mL (0 g Intravenous Stopped 03/01/23 1042)  chlordiazePOXIDE (LIBRIUM) capsule 50 mg (50 mg Oral Given 03/01/23 1045)     IMPRESSION / MDM / ASSESSMENT AND PLAN / ED COURSE  I reviewed the triage vital signs and the nursing notes.                              Differential diagnosis includes, but is not limited to, alcohol withdrawal, DTs, dehydration, hypokalemia, hypomagnesemia, gastritis/PUD  Patient's presentation is  most consistent with acute presentation with potential threat to life or bodily function.  The patient is on the cardiac monitor to evaluate for evidence of arrhythmia and/or significant heart rate changes  42 yo M with h/o EtOH dependence here with tremors, slight hallucinations, Etoh w/d. Pt given ativan x 2, librium and remains symptomatic though improving. He is 2-3 days into significant w/d and his hallucinations are concerning for early DTs, though he is alert and oriented. Labs c/w his EtOH  dependence with hypokalemia, dehydration. IVF, IV Thiamine given and will admit given his altered sensorium. IV K and Mag replacement ordered.    FINAL CLINICAL IMPRESSION(S) / ED DIAGNOSES   Final diagnoses:  Alcohol withdrawal syndrome without complication (HCC)     Rx / DC Orders   ED Discharge Orders     None        Note:  This document was prepared using Dragon voice recognition software and may include unintentional dictation errors.   Shaune Pollack, MD 03/01/23 1344

## 2023-03-01 NOTE — TOC CM/SW Note (Signed)
TOC consulted for [facility] resources. Resources have been added to be included on AVS.  Lennette Fader, LCSW Transitions of Care Department [phone]  

## 2023-03-01 NOTE — ED Notes (Signed)
Pt is tolerating PO fluids well, gave pt a sandwich tray upon pt request.

## 2023-03-01 NOTE — Discharge Instructions (Signed)
                  Intensive Outpatient Programs  [place] [facility]    [facility] [address]     [address] [place],  [place]     [place], [place] [phone]      [phone]  [facility]   [facility]  (Inpatient and outpatient)  [phone] (Suboxone and Methadone) [address]           [phone]           [facility]    [facility] - Intensive Outpatient [address]     [address] [place], [place] [address]     [place], [place]  [phone]      [phone]  [facility] (Outpatient, Inpatient, [facility]  [facility] (Groups and [facility]) (insurance only) [phone]    [place], [place]          [phone]       [facility]    [facility] (for caregivers and family) [address]     [address] [place], [place]     [place], [place] [phone]      [phone]  Residential Treatment Programs  [facility]  [facility](2 years) Residential: 90 days)  [facility] [address]      [address] [place], [place]     [place], [place] [phone]      [phone] or [phone]  [facility]    [facility] [address]      [address] [place], [place]     [place], [place] [phone]      [phone]  [facility]   [facility] ([facility] [address]     [address] [place], [place] [address]     [place], [place] [phone]      [phone] Admissions: 8am-3pm M-F  [facility]: Residential Program (90 Days)              [facility]: [facility]  [place], [place]     [place],   [phone] or [phone]    (Walk in Hours over the weekend or by referral)   [facility]: [facility]:[phone] (for crisis  response 24 hours a day) 

## 2023-03-01 NOTE — Assessment & Plan Note (Signed)
Replete Check mag level

## 2023-03-01 NOTE — H&P (Addendum)
History and Physical    Patient: Chad Walsh:[id number] DOB: 1980-09-09 DOA: 03/01/2023 DOS: the patient was seen and examined on 03/01/2023 PCP: Patient, No Pcp Per  Patient coming from: Home  Chief Complaint:  Chief Complaint  Patient presents with   Alcohol Intoxication   HPI: BRAYLN KELLISON is a 42 y.o. male with medical history significant of anxiety, depression, GERD, alcohol abuse presenting with alcohol withdrawals.  Patient reports relapsing into heavy alcohol use associated with his mother passing away.  Patient reports being abstinent from alcohol for approximately 15 months.  Has restarted drinking over the past 1 to 2 months.  Has been drinking greater than 1/5 of E&J daily.  Patient states he is try to "cut back "by drinking cat claw 10% alcohol beers.  Has been drinking at least 10+ of them daily.  Positive worsening tremor, agitation, fatigue.  Also with some auditory and visual hallucinations.  No headache.  No chest pain or shortness of breath.  Mild nausea.  No reported vomiting abdominal pain.  No reported hemiparesis or confusion.  Baseline history of depression.  Has had some remote episodes of SI.  Was evaluated June 30 in ER for similar symptoms and ultimately discharged home.  No active SI or HI at present. Presented to the ER afebrile, hemodynamically stable.  Satting well on room air. white count 11.2, hemoglobin 13, platelets 189, urine drug screen negative, creatinine 0.73, AST 53, ALT 48, alcohol level 40. Review of Systems: As mentioned in the history of present illness. All other systems reviewed and are negative.  Past Medical History:  Diagnosis Date   Anxiety    Depression    ETOH abuse    GERD (gastroesophageal reflux disease)    Past Surgical History:  Procedure Laterality Date   ESOPHAGOGASTRODUODENOSCOPY (EGD) WITH PROPOFOL N/A 11/13/2021   Procedure: ESOPHAGOGASTRODUODENOSCOPY (EGD) WITH PROPOFOL;  Surgeon: Toney Reil, MD;   Location: [facility];  Service: Gastroenterology;  Laterality: N/A;   ORIF ANKLE FRACTURE Left 09/27/2020   Procedure: OPEN REDUCTION INTERNAL FIXATION (ORIF) ANKLE FRACTURE, SYNDOSIS REPAIR;  Surgeon: Signa Kell, MD;  Location: [facility] ORS;  Service: Orthopedics;  Laterality: Left;   TONSILLECTOMY     WISDOM TOOTH EXTRACTION     Social History:  reports that he quit smoking about 2 years ago. His smoking use included cigarettes. He has a 9.00 pack-year smoking history. He has never used smokeless tobacco. He reports current alcohol use of about 28.0 standard drinks of alcohol per week. He reports that he does not currently use drugs after having used the following drugs: Marijuana.  No Known Allergies  Family History  Problem Relation Age of Onset   Lung cancer Mother     Prior to Admission medications   Medication Sig Start Date End Date Taking? Authorizing Provider  alum & mag hydroxide-simeth (MAALOX/MYLANTA) 200-200-20 MG/5ML suspension Take 15 mLs by mouth once every 6 (six) hours as needed for indigestion or heartburn. Patient not taking: Reported on 02/21/2023 11/15/21   Arnetha Courser, MD  Cholecalciferol (VITAMIN D3) 50 MCG (2000 UT) CAPS Take by mouth daily. Patient not taking: Reported on 06/11/2021    [provider]  folic acid (FOLVITE) 1 MG tablet Take 1 tablet (1 mg total) by mouth once daily. Patient not taking: Reported on 02/21/2023 11/16/21   Arnetha Courser, MD  lidocaine (LIDODERM) 5 % Place 2 patches topically onto the skin once daily. Remove and discard patches within 12 hours or as directed by  doctor. (Max of 2 patches in 24 hours). Patient not taking: Reported on 02/21/2023 11/15/21   Arnetha Courser, MD  Multiple Vitamins-Minerals (MULTIVITAMIN MEN PO) Take by mouth daily. Patient not taking: Reported on 06/11/2021    [provider]  nadolol (CORGARD) 20 MG tablet Take 1 tablet (20 mg total) by mouth once daily. Patient not taking: Reported on  02/21/2023 11/15/21   Arnetha Courser, MD  pantoprazole (PROTONIX) 40 MG tablet Take 1 tablet (40 mg total) by mouth 2 (two) times daily. Patient not taking: Reported on 02/21/2023 11/15/21   Arnetha Courser, MD  predniSONE (DELTASONE) 20 MG tablet Take 2 tablets (40 mg total) by mouth once daily with breakfast. Patient not taking: Reported on 02/21/2023 11/16/21   Arnetha Courser, MD  thiamine (VITAMIN B-1) 100 MG tablet Take 1 tablet (100 mg total) by mouth once daily. Patient not taking: Reported on 02/21/2023 11/16/21   Arnetha Courser, MD    Physical Exam: Vitals:   03/01/23 0900 03/01/23 1000 03/01/23 1037 03/01/23 1100  BP: (!) 161/111 (!) 158/106 (!) 158/106 (!) 143/94  Pulse: 93 (!) 102 (!) 101 96  Resp: 13 16 14 13   Temp:      TempSrc:      SpO2: 98% 100% 97% 94%  Weight:      Height:       Physical Exam Constitutional:      Appearance: He is obese.  HENT:     Head: Normocephalic and atraumatic.     Nose: Nose normal.     Mouth/Throat:     Mouth: Mucous membranes are moist.  Eyes:     Pupils: Pupils are equal, round, and reactive to light.  Cardiovascular:     Rate and Rhythm: Normal rate.     Pulses: Normal pulses.     Heart sounds: Normal heart sounds.  Abdominal:     General: Bowel sounds are normal.  Musculoskeletal:        General: Normal range of motion.  Skin:    General: Skin is warm.  Neurological:     General: No focal deficit present.  Psychiatric:        Mood and Affect: Mood normal.     Data Reviewed:  There are no new results to review at this time. Lab Results  Component Value Date   WBC 11.2 (H) 03/01/2023   HGB 13.3 03/01/2023   HCT 40.7 03/01/2023   MCV 82.1 03/01/2023   PLT 189 03/01/2023   Last metabolic panel Lab Results  Component Value Date   GLUCOSE 132 (H) 03/01/2023   NA 130 (L) 03/01/2023   K 2.8 (L) 03/01/2023   CL 91 (L) 03/01/2023   CO2 24 03/01/2023   BUN 12 03/01/2023   CREATININE 0.73 03/01/2023   GFRNONAA >60 03/01/2023    CALCIUM 8.7 (L) 03/01/2023   PROT 6.8 03/01/2023   ALBUMIN 4.0 03/01/2023   BILITOT 1.0 03/01/2023   ALKPHOS 124 03/01/2023   AST 53 (H) 03/01/2023   ALT 48 (H) 03/01/2023   ANIONGAP 15 03/01/2023    Assessment and Plan: * Alcohol withdrawal (HCC) Heavy ETOH use including 1/5th E&J daily with patient "stepping down" to white claw 10% ETOH beers 10+ per day  ETOH level 40  + tremor, agitation, audio/visual hallucinations Will start on CIWA protocol  Low threshold for precedex gtt  Will also consult psych as pt does report remote SI, though none today    Esophageal varices in alcoholic cirrhosis (HCC) No reported  vomiting PPI and Corgard  Alcoholic liver disease (HCC) AST 53, ALT 48 in setting of ETOH use  + cirrhotic disease on abd u/s  INR pending Trend LFTs with treatment    Hypokalemia Replete Check mag level  Depression Baseline depression history of suicidal ideation with noted concurrent alcohol withdrawals Noted recent evaluation June 30 for possible suicidal ideation in the setting of his mother recently passing away Positive auditory and visual hallucinations Will consult psych for further evaluation in the setting of alcohol withdrawals Follow     Greater than 50% was spent in counseling and coordination of care with patient Total encounter time 80 minutes or more    Advance Care Planning:   Code Status: Full Code   Consults: None   Family Communication: No family at the bedside   Severity of Illness: The appropriate patient status for this patient is INPATIENT. Inpatient status is judged to be reasonable and necessary in order to provide the required intensity of service to ensure the patient's safety. The patient's presenting symptoms, physical exam findings, and initial radiographic and laboratory data in the context of their chronic comorbidities is felt to place them at high risk for further clinical deterioration. Furthermore, it is not  anticipated that the patient will be medically stable for discharge from the hospital within 2 midnights of admission.   * I certify that at the point of admission it is my clinical judgment that the patient will require inpatient hospital care spanning beyond 2 midnights from the point of admission due to high intensity of service, high risk for further deterioration and high frequency of surveillance required.*  Author: Floydene Flock, MD 03/01/2023 12:26 PM  For on call review [email or web address].

## 2023-03-01 NOTE — ED Triage Notes (Signed)
Pt to ED for alcohol withdrawel. Pt usually drinks brandy (1/5 per day) but for last week has been drinking 10 "club tails" per day which have 10% alcohol.  Last drink 6pm last night.  Pt complains of tremors, hasn't eaten for 1 week, vomiting since last night. Has hx ETOH withdrawel.  Pt clammy. Oriented.

## 2023-03-01 NOTE — Assessment & Plan Note (Signed)
No reported vomiting PPI and Corgard

## 2023-03-02 ENCOUNTER — Other Ambulatory Visit: Payer: Self-pay

## 2023-03-02 DIAGNOSIS — F10930 Alcohol use, unspecified with withdrawal, uncomplicated: Secondary | ICD-10-CM

## 2023-03-02 LAB — GASTROINTESTINAL PANEL BY PCR, STOOL (REPLACES STOOL CULTURE)

## 2023-03-02 LAB — COMPREHENSIVE METABOLIC PANEL
ALT: 37 U/L (ref 0–44)
AST: 49 U/L — ABNORMAL HIGH (ref 15–41)
Albumin: 3.4 g/dL — ABNORMAL LOW (ref 3.5–5.0)
Alkaline Phosphatase: 106 U/L (ref 38–126)
Anion gap: 8 (ref 5–15)
BUN: 17 mg/dL (ref 6–20)
CO2: 26 mmol/L (ref 22–32)
Calcium: 9 mg/dL (ref 8.9–10.3)
Chloride: 101 mmol/L (ref 98–111)
Creatinine, Ser: 0.96 mg/dL (ref 0.61–1.24)
GFR, Estimated: >60 mL/min (ref >60)
Glucose, Bld: 86 mg/dL (ref 70–99)
Potassium: 3.5 mmol/L (ref 3.5–5.1)
Sodium: 135 mmol/L (ref 135–145)
Total Bilirubin: 1 mg/dL (ref 0.3–1.2)
Total Protein: 6.5 g/dL (ref 6.5–8.1)

## 2023-03-02 LAB — CBC
HCT: 35.7 % — ABNORMAL LOW (ref 39.0–52.0)
Hemoglobin: 11.4 g/dL — ABNORMAL LOW (ref 13.0–17.0)
MCH: 27.1 pg (ref 26.0–34.0)
MCHC: 31.9 g/dL (ref 30.0–36.0)
MCV: 85 fL (ref 80.0–100.0)
Platelets: 125 10*3/uL — ABNORMAL LOW (ref 150–400)
RBC: 4.2 MIL/uL — ABNORMAL LOW (ref 4.22–5.81)
RDW: 17.2 % — ABNORMAL HIGH (ref 11.5–15.5)
WBC: 7.7 10*3/uL (ref 4.0–10.5)
nRBC: 0 % (ref 0.0–0.2)

## 2023-03-02 LAB — HIV ANTIBODY (ROUTINE TESTING W REFLEX): HIV Screen 4th Generation wRfx: NONREACTIVE

## 2023-03-02 LAB — C DIFFICILE QUICK SCREEN W PCR REFLEX
C Diff antigen: NEGATIVE
C Diff interpretation: NOT DETECTED
C Diff toxin: NEGATIVE

## 2023-03-02 MED ORDER — LOPERAMIDE HCL 2 MG PO CAPS: 2.0000 mg | ORAL_CAPSULE | ORAL | Status: DC | PRN

## 2023-03-02 MED ORDER — BUPROPION HCL ER (XL) 150 MG PO TB24
300.0000 mg | ORAL_TABLET | Freq: Every morning | ORAL | Status: DC
  Administered 2023-03-03: 300 mg via ORAL
  Filled 2023-03-02 (×2): qty 2

## 2023-03-02 MED ORDER — BUPROPION HCL ER (XL) 150 MG PO TB24
150.0000 mg | ORAL_TABLET | Freq: Every day | ORAL | Status: DC
  Administered 2023-03-02: 150 mg via ORAL
  Filled 2023-03-02: qty 1

## 2023-03-02 MED ORDER — BUSPIRONE HCL 10 MG PO TABS
10.0000 mg | ORAL_TABLET | Freq: Three times a day (TID) | ORAL | Status: DC
  Administered 2023-03-02 – 2023-03-03 (×3): 10 mg via ORAL
  Filled 2023-03-02 (×3): qty 1

## 2023-03-02 NOTE — Progress Notes (Addendum)
PROGRESS NOTE    Chad Walsh  ZOX:[id number] DOB: 07/10/1981 DOA: 03/01/2023 PCP: Patient, No Pcp Per    Brief Narrative:   From admission h and p  Chad Walsh is a 42 y.o. male with medical history significant of anxiety, depression, GERD, alcohol abuse presenting with alcohol withdrawals.  Patient reports relapsing into heavy alcohol use associated with his mother passing away.  Patient reports being abstinent from alcohol for approximately 15 months.  Has restarted drinking over the past 1 to 2 months.  Has been drinking greater than 1/5 of E&J daily.  Patient states he is try to "cut back "by drinking cat claw 10% alcohol beers.  Has been drinking at least 10+ of them daily.  Positive worsening tremor, agitation, fatigue.  Also with some auditory and visual hallucinations.  No headache.  No chest pain or shortness of breath.  Mild nausea.  No reported vomiting abdominal pain.  No reported hemiparesis or confusion.  Baseline history of depression.  Has had some remote episodes of SI.  Was evaluated June 30 in ER for similar symptoms and ultimately discharged home.  No active SI or HI at present.   Assessment & Plan:   Principal Problem:   Alcohol withdrawal (HCC) Active Problems:   Esophageal varices in alcoholic cirrhosis (HCC)   Alcoholic liver disease (HCC)   Depression   Hypokalemia   Alcohol withdrawal (HCC) Heavy ETOH use including 1/5th E&J daily with patient "stepping down" to white claw 10% ETOH beers 10+ per day  ETOH level 40  + tremor, agitation, audio/visual hallucinations Denies history complicated withdrawal Mild symptoms today Continue lorazepam ciwa Possible librium taper at d/c    Esophageal varices in alcoholic cirrhosis (HCC) No reported vomiting PPI and Corgard  Diarrhea New, mild, no blood - f/u c diff and gi pathogen panel   Alcoholic liver disease (HCC) AST 53, ALT 48 in setting of ETOH use  + cirrhotic disease on abd u/s  - f/u  hbv, hcv   Hypokalemia Repleted, normal today   Depression Psych evaluated, no indication for inpatient admission, no additional psych recs as is usual for them - resume home wellbutrin and buspar  DVT prophylaxis: lovenox Code Status: full Family Communication: none @ bedside  Level of care: Telemetry Medical Status is: Inpatient Remains inpatient appropriate because: ongoing s/s/ withdrawal    Consultants:  none  Procedures: none  Antimicrobials:  none    Subjective: Reports mild tremulous and anxiety, mild non-bloody diarrhea  Objective: Vitals:   03/01/23 2109 03/02/23 0243 03/02/23 0515 03/02/23 0809  BP: 121/82 132/79 123/78 (!) 136/95  Pulse: 79 77 83 76  Resp: 18 18 17 16   Temp: 98.9 F (37.2 C) 98.5 F (36.9 C) 98.1 F (36.7 C) 97.7 F (36.5 C)  TempSrc:   Oral Oral  SpO2: 97% 100% 98% 99%  Weight:      Height:        Intake/Output Summary (Last 24 hours) at 03/02/2023 1347 Last data filed at 03/02/2023 1057 Gross per 24 hour  Intake 1205.41 ml  Output 1 ml  Net 1204.41 ml   Filed Weights   03/01/23 0727  Weight: 113.4 kg    Examination:  General exam: Appears calm and comfortable  Respiratory system: Clear to auscultation. Respiratory effort normal. Cardiovascular system: S1 & S2 heard, RRR. No JVD, murmurs, rubs, gallops or clicks. No pedal edema. Gastrointestinal system: Abdomen is nondistended, soft and nontender. No organomegaly or masses felt. Normal bowel sounds  heard. Central nervous system: Alert and oriented. No focal neurological deficits. Mild tremor Extremities: Symmetric 5 x 5 power. Skin: No rashes, lesions or ulcers Psychiatry: Judgement and insight appear normal. Mood & affect appropriate.     Data Reviewed: I have personally reviewed following labs and imaging studies  CBC: Recent Labs  Lab 03/01/23 0730 03/02/23 0602  WBC 11.2* 7.7  HGB 13.3 11.4*  HCT 40.7 35.7*  MCV 82.1 85.0  PLT 189 125*   Basic  Metabolic Panel: Recent Labs  Lab 03/01/23 0730 03/01/23 1539 03/02/23 0602  NA 130* 134* 135  K 2.8* 3.3* 3.5  CL 91* 96* 101  CO2 24 28 26   GLUCOSE 132* 101* 86  BUN 12 11 17   CREATININE 0.73 0.96 0.96  CALCIUM 8.7* 8.7* 9.0   GFR: Estimated Creatinine Clearance: 125 mL/min (by C-G formula based on SCr of 0.96 mg/dL). Liver Function Tests: Recent Labs  Lab 03/01/23 0730 03/02/23 0602  AST 53* 49*  ALT 48* 37  ALKPHOS 124 106  BILITOT 1.0 1.0  PROT 6.8 6.5  ALBUMIN 4.0 3.4*   No results for input(s): "LIPASE", "AMYLASE" in the last 168 hours. No results for input(s): "AMMONIA" in the last 168 hours. Coagulation Profile: Recent Labs  Lab 03/01/23 1539  INR 1.3*   Cardiac Enzymes: No results for input(s): "CKTOTAL", "CKMB", "CKMBINDEX", "TROPONINI" in the last 168 hours. BNP (last 3 results) No results for input(s): "PROBNP" in the last 8760 hours. HbA1C: No results for input(s): "HGBA1C" in the last 72 hours. CBG: No results for input(s): "GLUCAP" in the last 168 hours. Lipid Profile: No results for input(s): "CHOL", "HDL", "LDLCALC", "TRIG", "CHOLHDL", "LDLDIRECT" in the last 72 hours. Thyroid Function Tests: No results for input(s): "TSH", "T4TOTAL", "FREET4", "T3FREE", "THYROIDAB" in the last 72 hours. Anemia Panel: No results for input(s): "VITAMINB12", "FOLATE", "FERRITIN", "TIBC", "IRON", "RETICCTPCT" in the last 72 hours. Urine analysis:    Component Value Date/Time   COLORURINE YELLOW (A) 08/03/2018 0855   APPEARANCEUR CLOUDY (A) 08/03/2018 0855   LABSPEC 1.015 08/03/2018 0855   PHURINE 7.0 08/03/2018 0855   GLUCOSEU NEGATIVE 08/03/2018 0855   HGBUR NEGATIVE 08/03/2018 0855   BILIRUBINUR NEGATIVE 08/03/2018 0855   KETONESUR 5 (A) 08/03/2018 0855   PROTEINUR 30 (A) 08/03/2018 0855   NITRITE NEGATIVE 08/03/2018 0855   LEUKOCYTESUR NEGATIVE 08/03/2018 0855   Sepsis Labs: @LABRCNTIP (procalcitonin:4,lacticidven:4)  ) Recent Results (from the  past 240 hour(s))  C Difficile Quick Screen w PCR reflex     Status: None   Collection Time: 03/02/23 12:33 PM   Specimen: STOOL  Result Value Ref Range Status   C Diff antigen NEGATIVE NEGATIVE Final   C Diff toxin NEGATIVE NEGATIVE Final   C Diff interpretation No C. difficile detected.  Final    Comment: Performed at [facility], [address], [place], [place] [address]         Radiology Studies: No results found.      Scheduled Meds:  enoxaparin (LOVENOX) injection  55 mg Subcutaneous Q24H   folic acid  1 mg Oral Daily   LORazepam  1 mg Intravenous Once   multivitamin with minerals  1 tablet Oral Daily   nadolol  20 mg Oral Daily   pantoprazole  40 mg Oral BID   thiamine  100 mg Oral Daily   Or   thiamine  100 mg Intravenous Daily   Continuous Infusions:  sodium chloride 10 mL/hr at 03/01/23 2114  LOS: 1 day     Silvano Bilis, MD [facility]   If 7PM-7AM, please contact night-coverage [email or web address] Password [email or web address] 03/02/2023, 1:47 PM
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
## 2023-03-03 ENCOUNTER — Other Ambulatory Visit: Payer: Self-pay

## 2023-03-03 DIAGNOSIS — F10932 Alcohol use, unspecified with withdrawal with perceptual disturbance: Secondary | ICD-10-CM

## 2023-03-03 LAB — HEPATITIS B SURFACE ANTIGEN: Hepatitis B Surface Ag: NONREACTIVE

## 2023-03-03 MED ORDER — CHLORDIAZEPOXIDE HCL 25 MG PO CAPS
ORAL_CAPSULE | ORAL | 0 refills | Status: DC
  Filled 2023-03-03: qty 19, 4d supply, fill #0

## 2023-03-03 NOTE — TOC Transition Note (Addendum)
Transition of Care [facility]) - CM/SW Discharge Note   Patient Details  Name: Chad Walsh MRN: [id number] Date of Birth: Mar 08, 1981  Transition of Care [facility]) CM/SW Contact:  Margarito Liner, LCSW Phone Number: 03/03/2023, 12:29 PM   Clinical Narrative:   Patient has orders to discharge home today. No further concerns. CSW signing off.  3:32 pm: Patient needs a cab voucher home. Address on facesheet is correct. CSW faxed voucher to [place].  Final next level of care: Home/Self Care Barriers to Discharge: Barriers Resolved   Patient Goals and CMS Choice      Discharge Placement                  Patient to be transferred to facility by: Sister or friend   Patient and family notified of of transfer: 03/03/23  Discharge Plan and Services Additional resources added to the After Visit Summary for       Post Acute Care Choice: NA                               Social Determinants of Health (SDOH) Interventions SDOH Screenings   Food Insecurity: Food Insecurity Present (03/01/2023)  Housing: Medium Risk (03/01/2023)  Transportation Needs: No Transportation Needs (03/01/2023)  Utilities: At Risk (03/01/2023)  Tobacco Use: Medium Risk (03/01/2023)     Readmission Risk Interventions     No data to display

## 2023-03-03 NOTE — Discharge Summary (Signed)
Chad Walsh:[id number] DOB: 1980-11-30 DOA: 03/01/2023  PCP: Patient, No Pcp Per  Admit date: 03/01/2023 Discharge date: 03/03/2023  Time spent: 35 minutes  Recommendations for Outpatient Follow-up:  Substance abuse treatment  F/u hbv/hcv (pending at time of discharge)    Discharge Diagnoses:  Principal Problem:   Alcohol withdrawal (HCC) Active Problems:   Esophageal varices in alcoholic cirrhosis (HCC)   Alcoholic liver disease (HCC)   Depression   Hypokalemia   Discharge Condition: stable  Diet recommendation: regular  Filed Weights   03/01/23 0727  Weight: 113.4 kg    History of present illness:  From admission h and p Chad Walsh is a 42 y.o. male with medical history significant of anxiety, depression, GERD, alcohol abuse presenting with alcohol withdrawals.  Patient reports relapsing into heavy alcohol use associated with his mother passing away.  Patient reports being abstinent from alcohol for approximately 15 months.  Has restarted drinking over the past 1 to 2 months.  Has been drinking greater than 1/5 of E&J daily.  Patient states he is try to "cut back "by drinking cat claw 10% alcohol beers.  Has been drinking at least 10+ of them daily.  Positive worsening tremor, agitation, fatigue.  Also with some auditory and visual hallucinations.  No headache.  No chest pain or shortness of breath.  Mild nausea.  No reported vomiting abdominal pain.  No reported hemiparesis or confusion.  Baseline history of depression.  Has had some remote episodes of SI.  Was evaluated June 30 in ER for similar symptoms and ultimately discharged home.  No active SI or HI at present.   Hospital Course:   Alcohol withdrawal (HCC) Heavy drinker, last drink was evening of 7/6. Here mild tremor, visual hallucination both now resolved as of hospital day 1 and 2, treated with symptom-triggered ciwa. Discharging with librium taper. Substance abuse resources shared by  [facility]  Esophageal varices in alcoholic cirrhosis (HCC) No reported vomiting or bleeding. Previously treated with banding   Diarrhea New, mild, no blood.  -  c diff and gi pathogen panel negative   Alcoholic liver disease (HCC) AST 53, ALT 48 in setting of ETOH use  + cirrhotic disease on abd u/s  - f/u hbv, hcv   Hypokalemia Repleted, normal today   Depression Psych evaluated, no indication for inpatient admission, no additional psych recs as is usual for them - cont home wellbutrin and buspar  Procedures: none   Consultations: none  Discharge Exam: Vitals:   03/03/23 0413 03/03/23 0756  BP: (!) 130/93 139/83  Pulse: 86 89  Resp: 18 18  Temp: 98.4 F (36.9 C) 98.4 F (36.9 C)  SpO2: 98% 96%    General: NAD Cardiovascular: RRR Respiratory: CTAB Neuro: calm, no tremor  Discharge Instructions   Discharge Instructions     Diet general   Complete by: As directed    Increase activity slowly   Complete by: As directed       Allergies as of 03/03/2023   No Known Allergies      Medication List     TAKE these medications    buPROPion 150 MG 24 hr tablet Commonly known as: WELLBUTRIN XL Take 150 mg by mouth daily. In the afternoon   buPROPion 300 MG 24 hr tablet Commonly known as: WELLBUTRIN XL Take 300 mg by mouth in the morning. 300 mg in the morning 150 mg in the afternoon   busPIRone 10 MG tablet Commonly known as: BUSPAR Take  10 mg by mouth 3 (three) times daily.   chlordiazePOXIDE 25 MG capsule Commonly known as: LIBRIUM 2 tabs every 6 hours for 1 day, then 2 tabs every 8 hours for 1 day, then 2 tabs every 12 hours for 1 day, then 1 tab at night the final day   MULTIVITAMIN MEN PO Take by mouth daily.   vitamin D3 50 MCG (2000 UT) Caps Take by mouth daily.       No Known Allergies    The results of significant diagnostics from this hospitalization (including imaging, microbiology, ancillary and laboratory) are listed below for  reference.    Significant Diagnostic Studies: No results found.  Microbiology: Recent Results (from the past 240 hour(s))  C Difficile Quick Screen w PCR reflex     Status: None   Collection Time: 03/02/23 12:33 PM   Specimen: STOOL  Result Value Ref Range Status   C Diff antigen NEGATIVE NEGATIVE Final   C Diff toxin NEGATIVE NEGATIVE Final   C Diff interpretation No C. difficile detected.  Final    Comment: Performed at [facility], [address] [place], [place] [address]  Gastrointestinal Panel by PCR , Stool     Status: None   Collection Time: 03/02/23 12:33 PM   Specimen: STOOL  Result Value Ref Range Status   Campylobacter species NOT DETECTED NOT DETECTED Final   Plesimonas shigelloides NOT DETECTED NOT DETECTED Final   Salmonella species NOT DETECTED NOT DETECTED Final   Yersinia enterocolitica NOT DETECTED NOT DETECTED Final   Vibrio species NOT DETECTED NOT DETECTED Final   Vibrio cholerae NOT DETECTED NOT DETECTED Final   Enteroaggregative E coli (EAEC) NOT DETECTED NOT DETECTED Final   Enteropathogenic E coli (EPEC) NOT DETECTED NOT DETECTED Final   Enterotoxigenic E coli (ETEC) NOT DETECTED NOT DETECTED Final   Shiga like toxin producing E coli (STEC) NOT DETECTED NOT DETECTED Final   Shigella/Enteroinvasive E coli (EIEC) NOT DETECTED NOT DETECTED Final   Cryptosporidium NOT DETECTED NOT DETECTED Final   Cyclospora cayetanensis NOT DETECTED NOT DETECTED Final   Entamoeba histolytica NOT DETECTED NOT DETECTED Final   Giardia lamblia NOT DETECTED NOT DETECTED Final   Adenovirus F40/41 NOT DETECTED NOT DETECTED Final   Astrovirus NOT DETECTED NOT DETECTED Final   Norovirus GI/GII NOT DETECTED NOT DETECTED Final   Rotavirus A NOT DETECTED NOT DETECTED Final   Sapovirus (I, II, IV, and V) NOT DETECTED NOT DETECTED Final    Comment: Performed at [facility], [address] [place], [place] [address]     Labs: Basic Metabolic Panel: Recent  Labs  Lab 03/01/23 0730 03/01/23 1539 03/02/23 0602  NA 130* 134* 135  K 2.8* 3.3* 3.5  CL 91* 96* 101  CO2 24 28 26   GLUCOSE 132* 101* 86  BUN 12 11 17   CREATININE 0.73 0.96 0.96  CALCIUM 8.7* 8.7* 9.0   Liver Function Tests: Recent Labs  Lab 03/01/23 0730 03/02/23 0602  AST 53* 49*  ALT 48* 37  ALKPHOS 124 106  BILITOT 1.0 1.0  PROT 6.8 6.5  ALBUMIN 4.0 3.4*   No results for input(s): "LIPASE", "AMYLASE" in the last 168 hours. No results for input(s): "AMMONIA" in the last 168 hours. CBC: Recent Labs  Lab 03/01/23 0730 03/02/23 0602  WBC 11.2* 7.7  HGB 13.3 11.4*  HCT 40.7 35.7*  MCV 82.1 85.0  PLT 189 125*   Cardiac Enzymes: No results for input(s): "CKTOTAL", "CKMB", "CKMBINDEX", "TROPONINI" in  the last 168 hours. BNP: BNP (last 3 results) No results for input(s): "BNP" in the last 8760 hours.  ProBNP (last 3 results) No results for input(s): "PROBNP" in the last 8760 hours.  CBG: No results for input(s): "GLUCAP" in the last 168 hours.     Signed:  Silvano Bilis MD.  [facility] 03/03/2023, 11:33 AM

## 2023-03-03 NOTE — Plan of Care (Signed)

## 2023-03-04 LAB — HCV INTERPRETATION

## 2023-03-04 LAB — HCV AB W REFLEX TO QUANT PCR: HCV Ab: NONREACTIVE

## 2023-07-29 ENCOUNTER — Emergency Department
Admission: EM | Admit: 2023-07-29 | Discharge: 2023-07-30 | Disposition: A | Discharge status: Home / Self Care | Payer: MEDICAID | Attending: Emergency Medicine | Admitting: Emergency Medicine

## 2023-07-29 ENCOUNTER — Other Ambulatory Visit: Payer: Self-pay

## 2023-07-29 DIAGNOSIS — F1020 Alcohol dependence, uncomplicated: Secondary | ICD-10-CM | POA: Diagnosis not present

## 2023-07-29 DIAGNOSIS — F1021 Alcohol dependence, in remission: Secondary | ICD-10-CM

## 2023-07-29 DIAGNOSIS — F332 Major depressive disorder, recurrent severe without psychotic features: Secondary | ICD-10-CM | POA: Diagnosis not present

## 2023-07-29 DIAGNOSIS — R45851 Suicidal ideations: Secondary | ICD-10-CM | POA: Diagnosis not present

## 2023-07-29 DIAGNOSIS — F10920 Alcohol use, unspecified with intoxication, uncomplicated: Secondary | ICD-10-CM

## 2023-07-29 DIAGNOSIS — F10220 Alcohol dependence with intoxication, uncomplicated: Secondary | ICD-10-CM | POA: Diagnosis not present

## 2023-07-29 DIAGNOSIS — Y908 Blood alcohol level of 240 mg/100 ml or more: Secondary | ICD-10-CM | POA: Diagnosis not present

## 2023-07-29 DIAGNOSIS — F32A Depression, unspecified: Secondary | ICD-10-CM | POA: Diagnosis present

## 2023-07-29 LAB — COMPREHENSIVE METABOLIC PANEL
ALT: 25 U/L (ref 0–44)
AST: 26 U/L (ref 15–41)
Albumin: 4.5 g/dL (ref 3.5–5.0)
Alkaline Phosphatase: 93 U/L (ref 38–126)
Anion gap: 12 (ref 5–15)
BUN: 15 mg/dL (ref 6–20)
CO2: 25 mmol/L (ref 22–32)
Calcium: 8.7 mg/dL — ABNORMAL LOW (ref 8.9–10.3)
Chloride: 103 mmol/L (ref 98–111)
Creatinine, Ser: 0.86 mg/dL (ref 0.61–1.24)
GFR, Estimated: >60 mL/min (ref >60)
Glucose, Bld: 131 mg/dL — ABNORMAL HIGH (ref 70–99)
Potassium: 3.7 mmol/L (ref 3.5–5.1)
Sodium: 140 mmol/L (ref 135–145)
Total Bilirubin: 1.2 mg/dL — ABNORMAL HIGH (ref <1.2)
Total Protein: 8.2 g/dL — ABNORMAL HIGH (ref 6.5–8.1)

## 2023-07-29 LAB — CBC
HCT: 44.4 % (ref 39.0–52.0)
Hemoglobin: 14.8 g/dL (ref 13.0–17.0)
MCH: 28.3 pg (ref 26.0–34.0)
MCHC: 33.3 g/dL (ref 30.0–36.0)
MCV: 84.9 fL (ref 80.0–100.0)
Platelets: 401 10*3/uL — ABNORMAL HIGH (ref 150–400)
RBC: 5.23 MIL/uL (ref 4.22–5.81)
RDW: 15 % (ref 11.5–15.5)
WBC: 8.4 10*3/uL (ref 4.0–10.5)
nRBC: 0 % (ref 0.0–0.2)

## 2023-07-29 LAB — URINE DRUG SCREEN, QUALITATIVE (ARMC ONLY)
Amphetamines, Ur Screen: NOT DETECTED
Barbiturates, Ur Screen: NOT DETECTED
Benzodiazepine, Ur Scrn: NOT DETECTED
Cannabinoid 50 Ng, Ur ~~LOC~~: NOT DETECTED
Cocaine Metabolite,Ur ~~LOC~~: NOT DETECTED
MDMA (Ecstasy)Ur Screen: NOT DETECTED
Methadone Scn, Ur: NOT DETECTED
Opiate, Ur Screen: NOT DETECTED
Phencyclidine (PCP) Ur S: NOT DETECTED
Tricyclic, Ur Screen: NOT DETECTED

## 2023-07-29 LAB — ACETAMINOPHEN LEVEL: Acetaminophen (Tylenol), Serum: <10 ug/mL — ABNORMAL LOW (ref 10–30)

## 2023-07-29 LAB — SALICYLATE LEVEL: Salicylate Lvl: <7 mg/dL — ABNORMAL LOW (ref 7.0–30.0)

## 2023-07-29 LAB — ETHANOL: Alcohol, Ethyl (B): 356 mg/dL (ref <10)

## 2023-07-29 MED ORDER — HALOPERIDOL LACTATE 5 MG/ML IJ SOLN
2.0000 mg | Freq: Once | INTRAMUSCULAR | Status: AC
  Administered 2023-07-29: 2 mg via INTRAVENOUS
  Filled 2023-07-29: qty 1

## 2023-07-29 MED ORDER — FOLIC ACID 1 MG PO TABS
1.0000 mg | ORAL_TABLET | Freq: Every day | ORAL | Status: DC
  Administered 2023-07-29 – 2023-07-30 (×2): 1 mg via ORAL
  Filled 2023-07-29 (×2): qty 1

## 2023-07-29 MED ORDER — ONDANSETRON HCL 4 MG/2ML IJ SOLN
4.0000 mg | Freq: Once | INTRAMUSCULAR | Status: AC
  Administered 2023-07-29: 4 mg via INTRAVENOUS
  Filled 2023-07-29: qty 2

## 2023-07-29 MED ORDER — THIAMINE HCL 100 MG/ML IJ SOLN: 100.0000 mg | Freq: Every day | INTRAMUSCULAR | Status: DC

## 2023-07-29 MED ORDER — THIAMINE MONONITRATE 100 MG PO TABS
100.0000 mg | ORAL_TABLET | Freq: Every day | ORAL | Status: DC
  Administered 2023-07-29 – 2023-07-30 (×2): 100 mg via ORAL
  Filled 2023-07-29 (×2): qty 1

## 2023-07-29 MED ORDER — ADULT MULTIVITAMIN W/MINERALS CH
1.0000 | ORAL_TABLET | Freq: Every day | ORAL | Status: DC
  Administered 2023-07-29 – 2023-07-30 (×2): 1 via ORAL
  Filled 2023-07-29 (×2): qty 1

## 2023-07-29 MED ORDER — LORAZEPAM 1 MG PO TABS
1.0000 mg | ORAL_TABLET | Freq: Once | ORAL | Status: AC
  Administered 2023-07-29: 1 mg via ORAL
  Filled 2023-07-29: qty 1

## 2023-07-29 NOTE — ED Triage Notes (Signed)
BIB [facility] from home. Pt has been heavily drinking over past few days, endorsing SI. Does not reveal plan. Pt states that he has been suicidal since 51. State he was being seen at [facility] but dismissed from practice, does not state why.

## 2023-07-29 NOTE — ED Provider Notes (Signed)
[facility] Provider Note    Event Date/Time   First MD Initiated Contact with Patient 07/29/23 1438     (approximate)   History   Alcohol Intoxication and Suicidal   HPI  Chad Walsh is a 42 y.o. male past medical history significant for depression, alcohol abuse, who presents to the emergency department with acute intoxication and concern for suicidal ideation.  Patient states that he has suicidal thoughts and has been binge drinking.  States that he has plans to jump in the river by his house.  States that he lives alone.  States that his dad lives nearby.  States that he has had worsening stressors recently and started relapsing and drinking more.     Physical Exam   Triage Vital Signs: ED Triage Vitals  Encounter Vitals Group     BP 07/29/23 1449 (!) 161/119     Systolic BP Percentile --      Diastolic BP Percentile --      Pulse Rate 07/29/23 1449 (!) 121     Resp 07/29/23 1449 16     Temp 07/29/23 1449 98.1 F (36.7 C)     Temp Source 07/29/23 1449 Oral     SpO2 07/29/23 1449 98 %     Weight 07/29/23 1443 249 lb 1.9 oz (113 kg)     Height 07/29/23 1443 5\' 8"  (1.727 m)     Head Circumference --      Peak Flow --      Pain Score 07/29/23 1443 0     Pain Loc --      Pain Education --      Exclude from Growth Chart --     Most recent vital signs: Vitals:   07/29/23 1449 07/29/23 1521  BP: (!) 161/119 (!) 164/108  Pulse: (!) 121 (!) 115  Resp: 16 19  Temp: 98.1 F (36.7 C) 98.4 F (36.9 C)  SpO2: 98% 96%    Physical Exam Constitutional:      Appearance: He is well-developed.  HENT:     Head: Atraumatic.  Eyes:     Conjunctiva/sclera: Conjunctivae normal.  Cardiovascular:     Rate and Rhythm: Regular rhythm.  Pulmonary:     Effort: No respiratory distress.  Musculoskeletal:     Cervical back: Normal range of motion.  Skin:    General: Skin is warm.  Neurological:     Mental Status: He is alert. Mental status is  at baseline.  Psychiatric:        Mood and Affect: Mood is anxious.        Speech: Speech is slurred.        Behavior: Behavior is cooperative.        Thought Content: Thought content includes suicidal ideation. Thought content includes suicidal plan.     IMPRESSION / MDM / ASSESSMENT AND PLAN / ED COURSE  I reviewed the triage vital signs and the nursing notes.  Differential diagnosis including depression, alcohol intoxication, malingering    LABS (all labs ordered are listed, but only abnormal results are displayed) Labs interpreted as -    Labs Reviewed  COMPREHENSIVE METABOLIC PANEL - Abnormal; Notable for the following components:      Result Value   Glucose, Bld 131 (*)    Calcium 8.7 (*)    Total Protein 8.2 (*)    Total Bilirubin 1.2 (*)    All other components within normal limits  ETHANOL - Abnormal; Notable for the  following components:   Alcohol, Ethyl (B) 356 (*)    All other components within normal limits  CBC - Abnormal; Notable for the following components:   Platelets 401 (*)    All other components within normal limits  SALICYLATE LEVEL  ACETAMINOPHEN LEVEL  URINE DRUG SCREEN, QUALITATIVE ([facility] ONLY)    MDM    Alcohol level 356.  No significant electrolyte abnormality.  Given concern for suicidal thoughts with a plan IVC paperwork was filled out on the patient.  Given IV Haldol for agitation secondary to alcohol.  Will perform CIWA.   The patient has been placed in psychiatric observation due to the need to provide a safe environment for the patient while obtaining psychiatric consultation and evaluation, as well as ongoing medical and medication management to treat the patient's condition.  The patient has been placed under full IVC at this time.   PROCEDURES:  Critical Care performed: No  Procedures  Patient's presentation is most consistent with acute presentation with potential threat to life or bodily function.   MEDICATIONS ORDERED IN  ED: Medications  haloperidol lactate (HALDOL) injection 2 mg (has no administration in time range)  thiamine (VITAMIN B1) tablet 100 mg (has no administration in time range)    Or  thiamine (VITAMIN B1) injection 100 mg (has no administration in time range)  folic acid (FOLVITE) tablet 1 mg (has no administration in time range)  multivitamin with minerals tablet 1 tablet (has no administration in time range)    FINAL CLINICAL IMPRESSION(S) / ED DIAGNOSES   Final diagnoses:  Alcoholic intoxication without complication (HCC)  Suicidal ideation     Rx / DC Orders   ED Discharge Orders     None        Note:  This document was prepared using Dragon voice recognition software and may include unintentional dictation errors.   Corena Herter, MD 07/29/23 1534

## 2023-07-29 NOTE — ED Notes (Addendum)
Patient complaining of feelings of nausea at this time. MD made aware. Order for Zofran to be put in.

## 2023-07-29 NOTE — ED Notes (Signed)
Patient has stated multiple times he is going through alcohol withdrawals and requesting ativan. This RN has been assessing patient frequently and patient has shown minimal s/s of any type of withdrawals throughout shift thus far and when asked cannot explain what his withdrawal symptoms are. MD has been aware with continuous updates. CIWA being completed Q4.

## 2023-07-29 NOTE — ED Notes (Signed)
CIWA start at 1600 patient is now asleep. Spoke with MD they are okay with starting CIWA when patient wakes up.

## 2023-07-29 NOTE — BH Assessment (Signed)
Comprehensive Clinical Assessment (CCA) Screening, Triage and Referral Note  07/29/2023 Chad Walsh [id number]  Catha Gosselin, 42 year old male who presents to [facility] ED involuntarily for treatment. Per triage note, BIB [facility] from home. Pt has been heavily drinking over past few days, endorsing SI. Does not reveal plan. Pt states that he has been suicidal since 20. State he was being seen at [facility] but dismissed from practice, does not state why.   During TTS assessment pt presents alert and oriented x 4, restless but cooperative, and mood-congruent with affect. The pt does not appear to be responding to internal or external stimuli. Neither is the pt presenting with any delusional thinking. Pt verified the information provided to triage RN.   Pt identifies his main complaint to be that he is an "alcoholic" and has been drinking "a lot" for the past week. Patient reports drinking 3 fifths each day on the weekends for the past 3 months. Patient states he is having severe withdrawal symptoms but only mentioned nausea and "feeling like having a seizure." Patient says he began drinking about 3 years ago after his mom passed away. Patient reports normal eating and sleep habits. Patient says he has been employed at [employer] for the past 5 months but not able to state his job position. "I signed a NDA." Patient says he lives alone but has family(sister and dad) that lives nearby. Patient is hesitant to commit to detox/rehabilitation. "I just need to make it through today. I just want someone to watch me and talk to me." Patient denies current SI/HI/AH/VH; yet admits to having thoughts of wanting to hurt himself by jumping in a river. Patient reports he does not have access to firearms. Pt declined informing his father or sister that he is in the ED. Writer offered to provide patient with resources and contact [facility] for inpatient. Patient says he needs time to think about it because he must work.  Writer informed patient the possibility of being discharged tonight or in the morning. Patient verbalized understanding.    Disposition pending.   Chief Complaint:  Chief Complaint  Patient presents with   Alcohol Intoxication   Suicidal   Visit Diagnosis: Alcohol intoxication  Patient Reported Information How did you hear about [place]? Self  What Is the Reason for Your Visit/Call Today? Alcohol abuse  How Long Has This Been Causing You Problems? > than 6 months  What Do You Feel Would Help You the Most Today? Alcohol or Drug Use Treatment; Medication(s); Treatment for Depression or other mood problem   Have You Recently Had Any Thoughts About Hurting Yourself? Yes  Are You Planning to Commit Suicide/Harm Yourself At This time? No   Have you Recently Had Thoughts About Hurting Someone Karolee Ohs? No  Are You Planning to Harm Someone at This Time? No  Explanation: No data recorded  Have You Used Any Alcohol or Drugs in the Past 24 Hours? No data recorded How Long Ago Did You Use Drugs or Alcohol? No data recorded What Did You Use and How Much? No data recorded  Do You Currently Have a Therapist/Psychiatrist? No data recorded Name of Therapist/Psychiatrist: No data recorded  Have You Been Recently Discharged From Any Office Practice or Programs? No  Explanation of Discharge From Practice/Program: No data recorded   CCA Screening Triage Referral Assessment Type of Contact: Face-to-Face  Telemedicine Service Delivery:   Is this Initial or Reassessment?   Date Telepsych consult ordered in [facility]:  Time Telepsych consult ordered in [facility]:    Location of Assessment: [facility] ED  Provider Location: [facility] ED    Collateral Involvement: None provided   Does Patient Have a Court Appointed Legal Guardian? No data recorded Name and Contact of Legal Guardian: No data recorded If Minor and Not Living with Parent(s), Who has Custody? No data recorded Is CPS involved or ever been involved?  Never  Is APS involved or ever been involved? Never   Patient Determined To Be At Risk for Harm To Self or Others Based on Review of Patient Reported Information or Presenting Complaint? No  Method: Plan without intent  Availability of Means: No access or NA  Intent: Vague intent or NA  Notification Required: No need or identified person  Additional Information for Danger to Others Potential: No data recorded Additional Comments for Danger to Others Potential: No data recorded Are There Guns or Other Weapons in Your Home? No  Types of Guns/Weapons: No data recorded Are These Weapons Safely Secured?                            No data recorded Who Could Verify You Are Able To Have These Secured: No data recorded Do You Have any Outstanding Charges, Pending Court Dates, Parole/Probation? No data recorded Contacted To Inform of Risk of Harm To Self or Others: No data recorded  Does Patient Present under Involuntary Commitment? Yes    [place] of Residence: [place]   Patient Currently Receiving the Following Services: Not Receiving Services   Determination of Need: Emergent (2 hours)   Options For Referral: ED Visit; Therapeutic Triage Services   Discharge Disposition:     Clerance Lav, Counselor, LCAS-A

## 2023-07-29 NOTE — ED Notes (Signed)
Dressed out into Nature conservation officer by Financial planner. Belongings include cell phone, keys, pants, and shirts, labeled appropriately.

## 2023-07-29 NOTE — ED Notes (Signed)
Patient has asked multiple times for ativan or some type of medication. Informed MD of this. Informed patient as a nurse I cannot put in orders for medication along with harassing security for medicine. Patient asked to speak with MD. MD went and talked with patient and he was asking for an IV (which patient already has placed). Patient becoming more irritated at this time. MD stated she would give patient some medication.

## 2023-07-29 NOTE — ED Notes (Signed)
Patient received dinner tray at this time.  

## 2023-07-30 ENCOUNTER — Inpatient Hospital Stay
Admission: RE | Admit: 2023-07-30 | Discharge: 2023-08-03 | DRG: 885 | Disposition: A | Discharge status: Home / Self Care | Payer: MEDICAID | Source: Intra-hospital | Attending: Psychiatry | Admitting: Psychiatry

## 2023-07-30 ENCOUNTER — Encounter: Payer: Self-pay | Admitting: Radiology

## 2023-07-30 DIAGNOSIS — Z87891 Personal history of nicotine dependence: Secondary | ICD-10-CM

## 2023-07-30 DIAGNOSIS — F1021 Alcohol dependence, in remission: Secondary | ICD-10-CM

## 2023-07-30 DIAGNOSIS — Z79899 Other long term (current) drug therapy: Secondary | ICD-10-CM | POA: Diagnosis not present

## 2023-07-30 DIAGNOSIS — F10939 Alcohol use, unspecified with withdrawal, unspecified: Secondary | ICD-10-CM | POA: Diagnosis present

## 2023-07-30 DIAGNOSIS — Z91128 Patient's intentional underdosing of medication regimen for other reason: Secondary | ICD-10-CM

## 2023-07-30 DIAGNOSIS — F1020 Alcohol dependence, uncomplicated: Secondary | ICD-10-CM | POA: Diagnosis not present

## 2023-07-30 DIAGNOSIS — R45851 Suicidal ideations: Secondary | ICD-10-CM

## 2023-07-30 DIAGNOSIS — Z634 Disappearance and death of family member: Secondary | ICD-10-CM | POA: Diagnosis not present

## 2023-07-30 DIAGNOSIS — F332 Major depressive disorder, recurrent severe without psychotic features: Secondary | ICD-10-CM

## 2023-07-30 DIAGNOSIS — F419 Anxiety disorder, unspecified: Secondary | ICD-10-CM | POA: Diagnosis present

## 2023-07-30 DIAGNOSIS — R251 Tremor, unspecified: Secondary | ICD-10-CM | POA: Diagnosis present

## 2023-07-30 DIAGNOSIS — Y908 Blood alcohol level of 240 mg/100 ml or more: Secondary | ICD-10-CM | POA: Diagnosis present

## 2023-07-30 DIAGNOSIS — F10239 Alcohol dependence with withdrawal, unspecified: Secondary | ICD-10-CM | POA: Diagnosis present

## 2023-07-30 DIAGNOSIS — F10920 Alcohol use, unspecified with intoxication, uncomplicated: Secondary | ICD-10-CM

## 2023-07-30 MED ORDER — MAGNESIUM HYDROXIDE 400 MG/5ML PO SUSP: 15.0000 mL | Freq: Every day | ORAL | Status: DC | PRN

## 2023-07-30 MED ORDER — LORAZEPAM 2 MG/ML IJ SOLN: 2.0000 mg | Freq: Four times a day (QID) | INTRAMUSCULAR | Status: DC | PRN

## 2023-07-30 MED ORDER — DIPHENHYDRAMINE HCL 50 MG/ML IJ SOLN: 25.0000 mg | Freq: Four times a day (QID) | INTRAMUSCULAR | Status: DC | PRN

## 2023-07-30 MED ORDER — HALOPERIDOL LACTATE 5 MG/ML IJ SOLN: 5.0000 mg | Freq: Three times a day (TID) | INTRAMUSCULAR | Status: DC | PRN

## 2023-07-30 MED ORDER — DIPHENHYDRAMINE HCL 50 MG/ML IJ SOLN: 50.0000 mg | Freq: Three times a day (TID) | INTRAMUSCULAR | Status: DC | PRN

## 2023-07-30 MED ORDER — LORAZEPAM 1 MG PO TABS
1.0000 mg | ORAL_TABLET | Freq: Two times a day (BID) | ORAL | Status: AC
  Administered 2023-08-01 – 2023-08-02 (×2): 1 mg via ORAL
  Filled 2023-07-30 (×2): qty 1

## 2023-07-30 MED ORDER — ADULT MULTIVITAMIN W/MINERALS CH
1.0000 | ORAL_TABLET | Freq: Every day | ORAL | Status: DC
  Administered 2023-07-30 – 2023-08-03 (×5): 1 via ORAL
  Filled 2023-07-30 (×6): qty 1

## 2023-07-30 MED ORDER — ONDANSETRON 4 MG PO TBDP: 4.0000 mg | ORAL_TABLET | Freq: Four times a day (QID) | ORAL | Status: AC | PRN

## 2023-07-30 MED ORDER — LORAZEPAM 2 MG/ML IJ SOLN: 2.0000 mg | Freq: Three times a day (TID) | INTRAMUSCULAR | Status: DC | PRN

## 2023-07-30 MED ORDER — LORAZEPAM 1 MG PO TABS
1.0000 mg | ORAL_TABLET | Freq: Three times a day (TID) | ORAL | Status: AC
  Administered 2023-07-31 – 2023-08-01 (×3): 1 mg via ORAL
  Filled 2023-07-30 (×3): qty 1

## 2023-07-30 MED ORDER — LOPERAMIDE HCL 2 MG PO CAPS: 2.0000 mg | ORAL_CAPSULE | ORAL | Status: AC | PRN

## 2023-07-30 MED ORDER — HYDROXYZINE HCL 25 MG PO TABS: 25.0000 mg | ORAL_TABLET | Freq: Three times a day (TID) | ORAL | Status: DC | PRN

## 2023-07-30 MED ORDER — DIPHENHYDRAMINE HCL 25 MG PO CAPS
50.0000 mg | ORAL_CAPSULE | Freq: Three times a day (TID) | ORAL | Status: DC | PRN
  Administered 2023-07-30: 50 mg via ORAL
  Filled 2023-07-30: qty 2

## 2023-07-30 MED ORDER — HALOPERIDOL LACTATE 5 MG/ML IJ SOLN: 5.0000 mg | Freq: Four times a day (QID) | INTRAMUSCULAR | Status: DC | PRN

## 2023-07-30 MED ORDER — HALOPERIDOL 5 MG PO TABS: 5.0000 mg | ORAL_TABLET | Freq: Three times a day (TID) | ORAL | Status: DC | PRN

## 2023-07-30 MED ORDER — HALOPERIDOL LACTATE 5 MG/ML IJ SOLN: 10.0000 mg | Freq: Three times a day (TID) | INTRAMUSCULAR | Status: DC | PRN

## 2023-07-30 MED ORDER — ACETAMINOPHEN 500 MG PO TABS: 500.0000 mg | ORAL_TABLET | Freq: Four times a day (QID) | ORAL | Status: DC | PRN

## 2023-07-30 MED ORDER — GABAPENTIN 300 MG PO CAPS
300.0000 mg | ORAL_CAPSULE | Freq: Three times a day (TID) | ORAL | Status: DC
  Administered 2023-07-30 – 2023-08-03 (×12): 300 mg via ORAL
  Filled 2023-07-30 (×12): qty 1

## 2023-07-30 MED ORDER — HYDROXYZINE HCL 25 MG PO TABS
50.0000 mg | ORAL_TABLET | Freq: Once | ORAL | Status: AC
Start: 2023-07-30 — End: 2023-07-30
  Administered 2023-07-30: 50 mg via ORAL
  Filled 2023-07-30: qty 2

## 2023-07-30 MED ORDER — LORAZEPAM 1 MG PO TABS
1.0000 mg | ORAL_TABLET | Freq: Four times a day (QID) | ORAL | Status: AC | PRN
Start: 2023-07-30 — End: 2023-08-02
  Administered 2023-08-01: 1 mg via ORAL
  Filled 2023-07-30 (×2): qty 1

## 2023-07-30 MED ORDER — LORAZEPAM 1 MG PO TABS
1.0000 mg | ORAL_TABLET | Freq: Four times a day (QID) | ORAL | Status: AC
  Administered 2023-07-30 – 2023-07-31 (×4): 1 mg via ORAL
  Filled 2023-07-30 (×4): qty 1

## 2023-07-30 MED ORDER — LORAZEPAM 1 MG PO TABS
1.0000 mg | ORAL_TABLET | Freq: Every day | ORAL | Status: AC
  Administered 2023-08-03: 1 mg via ORAL
  Filled 2023-07-30: qty 1

## 2023-07-30 MED ORDER — THIAMINE MONONITRATE 100 MG PO TABS
100.0000 mg | ORAL_TABLET | Freq: Every day | ORAL | Status: DC
  Administered 2023-07-31 – 2023-08-03 (×4): 100 mg via ORAL
  Filled 2023-07-30 (×4): qty 1

## 2023-07-30 MED ORDER — HYDROXYZINE HCL 25 MG PO TABS
25.0000 mg | ORAL_TABLET | Freq: Four times a day (QID) | ORAL | Status: AC | PRN
  Administered 2023-07-31 – 2023-08-01 (×2): 25 mg via ORAL
  Filled 2023-07-30 (×2): qty 1

## 2023-07-30 NOTE — Progress Notes (Signed)
Admission note:  Consents signed, literature detailing the patient's rights, responsibilities, and visitor guidelines provided. Skin search with 3 tattoos otherwise unremarkable. Belongings search completed with no contraband found. Patient endorses ETOH use/abuse and "binges" on weekends and holidays. He states he has "struggled with this most of his life'. His triggers are the passing of his mother 3 years ago. He drinks up to a fifth and 3/4 of a fith of brandy a day." CIWA= 14 with Ativan 1 mg administered with good results/ effective. He was oriented to his room and the unit. No questions or concerns at present. He contracts for safety and remains safe on the unit at this time.

## 2023-07-30 NOTE — [facility] Counselor (Signed)
Adult Comprehensive Assessment  Patient ID: Chad Walsh, male   DOB: [birth year], 42 y.o.   MRN: [id number]  Information Source: Information source: Patient  Current Stressors:  Patient states their primary concerns and needs for treatment are:: "For alcohol withdrawals and I was contemplating suicide while I was drinking." Patient states their goals for this hospitilization and ongoing recovery are:: "Handle the withdrawals and talk with somebody." Educational / Learning stressors: Patient denies. Employment / Job issues: "Nothing beyond the normal." Family Relationships: "Maybe some with my dad. He cussed me out and hung up on me the last time I was here." Financial / Lack of resources (include bankruptcy): "I was trying to get caught up on my rent the last time I was here." Housing / Lack of housing: Patient denies. Physical health (include injuries & life threatening diseases): "No, just have a plate in my left ankle with 5 screws." Social relationships: "I'm fairly introverted." Substance abuse: Patient reports alcohol use where he can drink a fifth a day. Bereavement / Loss: "My mom 3 years ago on November 14th."  Living/Environment/Situation:  Living Arrangements: Alone Living conditions (as described by patient or guardian): WNL Who else lives in the home?: "I live alone." How long has patient lived in current situation?: "Pretty much my while life minus 6 months." What is atmosphere in current home: Comfortable  Family History:  Marital status: Single Are you sexually active?: No What is your sexual orientation?: "Straight" Has your sexual activity been affected by drugs, alcohol, medication, or emotional stress?: "Yes" Does patient have children?: No  Childhood History:  By whom was/is the patient raised?: Both parents Additional childhood history information: None reported. Description of patient's relationship with caregiver when they were a child: "It was good.  They were good parents." Patient's description of current relationship with people who raised him/her: "With my dad, it's a little rocky." How were you disciplined when you got in trouble as a child/adolescent?: "I'd get spankings, sometimes. More from my dad." Does patient have siblings?: Yes Number of Siblings: 1 Description of patient's current relationship with siblings: "Great. I literally just got off the phone with my sister." Did patient suffer any verbal/emotional/physical/sexual abuse as a child?: No Did patient suffer from severe childhood neglect?: No Has patient ever been sexually abused/assaulted/raped as an adolescent or adult?: No Was the patient ever a victim of a crime or a disaster?: No Witnessed domestic violence?: Yes Has patient been affected by domestic violence as an adult?: No Description of domestic violence: No details provided.  Education:  Highest grade of school patient has completed: "GED" Currently a student?: No Learning disability?: Yes What learning problems does patient have?: "I believe I had ADHD."  Employment/Work Situation:   Employment Situation: Employed Where is Patient Currently Employed?: "[employer]" How Long has Patient Been Employed?: "5 Months." Are You Satisfied With Your Job?: Yes Do You Work More Than One Job?: No Work Stressors: "No. I'm new so just trying to learn and make sure I don't mess anything up." Patient's Job has Been Impacted by Current Illness: Yes Describe how Patient's Job has Been Impacted: Patient did not provide details. What is the Longest Time Patient has Held a Job?: "15 years." Where was the Patient Employed at that Time?: "[employer]." Has Patient ever Been in the [employer]?: No  Financial Resources:   Financial resources: Income from employment Does patient have a representative payee or guardian?: No  Alcohol/Substance Abuse:   What has been your use  of drugs/alcohol within the last 12 months?:  "Mainly on the weekends." If attempted suicide, did drugs/alcohol play a role in this?: Yes Alcohol/Substance Abuse Treatment Hx: Past Tx, Outpatient If yes, describe treatment: "[facility] and detox at [facility]." Has alcohol/substance abuse ever caused legal problems?: No  Social Support System:   Conservation officer, nature Support System: Fair Development worker, community Support System: "My sister mostly. My dad too as long as he's not grumpy." Type of faith/religion: "No, I'm panthiest." How does patient's faith help to cope with current illness?: Patient denies.  Leisure/Recreation:   Do You Have Hobbies?: Yes Leisure and Hobbies: "I play video games. I used to play guitar a lot and do martial arts."  Strengths/Needs:   What is the patient's perception of their strengths?: "Patience." Patient states they can use these personal strengths during their treatment to contribute to their recovery: "Stay patient with myself and take it one moment at a time." Patient states these barriers may affect/interfere with their treatment: Patient denies. Patient states these barriers may affect their return to the community: Patient denies.  Discharge Plan:   Currently receiving community mental health services: No Patient states concerns and preferences for aftercare planning are: Patient denies. Patient states they will know when they are safe and ready for discharge when: "It would be a feeling." Does patient have access to transportation?: No Does patient have financial barriers related to discharge medications?: No Patient description of barriers related to discharge medications: Patient does not have insurance. Plan for no access to transportation at discharge: CSW to assist with transporation needs. Will patient be returning to same living situation after discharge?: Yes  Summary/Recommendations:   Summary and Recommendations (to be completed by the evaluator): Patient is a 42 year old while male who presented to  the ED involuntarily with concern for alcohol withdrawal, worsening depression and suicidal ideations. According to patient he has been heavily drinking over the past few days and has been experiencing fleeting SI. Patient states 1 week ago he relapsed on alcohol after a few months of sobriety. Patient added that he has been trying to quit drinking for the last 15 months. 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 Patient states he drinks a fifth and a half of liquor daily. Patient has been to detox multiple times, no rehabilitation programs. No legal issues associated with alcohol. Patient does not currently have a plan. Patient has been suicidal since 52. Patient states his relapsed was not triggered by any acute stressors. Patient endorses chronic, daily depression since his mother passed away a few years ago. Patient denied any stressors outside of tensions with his father. Patient expressed that his father can be "grumpy" which makes navigating a relationship with him difficult. Patient expressed adequate support from his sister. Patient currently works with [employer] where he expressed having a supervisor who is aware of his treatment and is "very supportive." Patient added that he went through programs with [facility] and explained that while it was very helpful, it was difficult navigating the full-time program while also working. Patient is hesitant to commit to detox/rehabilitation. Patient says he needs time to think about it because he must work. Patient denies current SI/HI/AH/VH and further substance use.  Lowry Ram. 07/30/2023

## 2023-07-30 NOTE — Group Note (Signed)
LCSW Group Therapy Note   Group Date: 07/30/2023 Start Time: 1300 End Time: 1400   Type of Therapy and Topic:  Group Therapy: Challenging Core Beliefs  Participation Level:  Did Not Attend  Description of Group:  Patients were educated about core beliefs and asked to identify one harmful core belief that they have. Patients were asked to explore from where those beliefs originate. Patients were asked to discuss how those beliefs make them feel and the resulting behaviors of those beliefs. They were then be asked if those beliefs are true and, if so, what evidence they have to support them. Lastly, group members were challenged to replace those negative core beliefs with helpful beliefs.   Therapeutic Goals:   1. Patient will identify harmful core beliefs and explore the origins of such beliefs. 2. Patient will identify feelings and behaviors that result from those core beliefs. 3. Patient will discuss whether such beliefs are true. 4.  Patient will replace harmful core beliefs with helpful ones.  Summary of Patient Progress:  Patient did not attend group.  Therapeutic Modalities: Cognitive Behavioral Therapy; Solution-Focused Therapy   Lowry Ram, Theresia Majors 07/30/2023  2:17 PM

## 2023-07-30 NOTE — Plan of Care (Signed)
  Problem: Education: Goal: Knowledge of [facility] Education information/materials will improve Outcome: Not Progressing Goal: Emotional status will improve Outcome: Not Progressing Goal: Mental status will improve Outcome: Not Progressing Goal: Verbalization of understanding the information provided will improve Outcome: Not Progressing   Problem: Activity: Goal: Interest or engagement in activities will improve Outcome: Not Progressing Goal: Sleeping patterns will improve Outcome: Not Progressing   Problem: Coping: Goal: Ability to verbalize frustrations and anger appropriately will improve Outcome: Not Progressing Goal: Ability to demonstrate self-control will improve Outcome: Not Progressing   Problem: Health Behavior/Discharge Planning: Goal: Identification of resources available to assist in meeting health care needs will improve Outcome: Not Progressing Goal: Compliance with treatment plan for underlying cause of condition will improve Outcome: Not Progressing   Problem: Physical Regulation: Goal: Ability to maintain clinical measurements within normal limits will improve Outcome: Not Progressing   Problem: Safety: Goal: Periods of time without injury will increase Outcome: Not Progressing   Problem: Education: Goal: Knowledge of disease or condition will improve Outcome: Not Progressing Goal: Understanding of discharge needs will improve Outcome: Not Progressing   Problem: Health Behavior/Discharge Planning: Goal: Ability to identify changes in lifestyle to reduce recurrence of condition will improve Outcome: Not Progressing Goal: Identification of resources available to assist in meeting health care needs will improve Outcome: Not Progressing   Problem: Physical Regulation: Goal: Complications related to the disease process, condition or treatment will be avoided or minimized Outcome: Not Progressing   Problem: Safety: Goal: Ability to remain  free from injury will improve Outcome: Not Progressing   Problem: Education: Goal: Utilization of techniques to improve thought processes will improve Outcome: Not Progressing Goal: Knowledge of the prescribed therapeutic regimen will improve Outcome: Not Progressing   Problem: Activity: Goal: Interest or engagement in leisure activities will improve Outcome: Not Progressing Goal: Imbalance in normal sleep/wake cycle will improve Outcome: Not Progressing   Problem: Coping: Goal: Coping ability will improve Outcome: Not Progressing Goal: Will verbalize feelings Outcome: Not Progressing   Problem: Health Behavior/Discharge Planning: Goal: Ability to make decisions will improve Outcome: Not Progressing Goal: Compliance with therapeutic regimen will improve Outcome: Not Progressing   Problem: Role Relationship: Goal: Will demonstrate positive changes in social behaviors and relationships Outcome: Not Progressing   Problem: Safety: Goal: Ability to disclose and discuss suicidal ideas will improve Outcome: Not Progressing Goal: Ability to identify and utilize support systems that promote safety will improve Outcome: Not Progressing   Problem: Self-Concept: Goal: Will verbalize positive feelings about self Outcome: Not Progressing Goal: Level of anxiety will decrease Outcome: Not Progressing   Problem: Education: Goal: Ability to make informed decisions regarding treatment will improve Outcome: Not Progressing   Problem: Coping: Goal: Coping ability will improve Outcome: Not Progressing   Problem: Health Behavior/Discharge Planning: Goal: Identification of resources available to assist in meeting health care needs will improve Outcome: Not Progressing   Problem: Medication: Goal: Compliance with prescribed medication regimen will improve Outcome: Not Progressing   Problem: Self-Concept: Goal: Ability to disclose and discuss suicidal ideas will improve Outcome:  Not Progressing Goal: Will verbalize positive feelings about self Outcome: Not Progressing Note:

## 2023-07-30 NOTE — BH Assessment (Signed)
Patient is to be admitted to [facility]  Attending Physician will be Dr. Marlou Porch.   Patient has been assigned to room 315, by [facility] Charge Nurse [place].    ER staff is aware of the admission: [facility] ER Secretary   Dr. Dolores Frame, ER MD  Bella Kennedy Patient's Nurse

## 2023-07-30 NOTE — ED Notes (Signed)
IVC/ pt going to lower level in am

## 2023-07-30 NOTE — Group Note (Signed)
Date:  07/30/2023 Time:  9:34 PM  Group Topic/Focus:  Self Care:   The focus of this group is to help patients understand the importance of self-care in order to improve or restore emotional, physical, spiritual, interpersonal, and financial health.    Participation Level:  Active  Participation Quality:  Appropriate, Attentive, Sharing, and Supportive  Affect:  Appropriate  Cognitive:  Appropriate  Insight: Appropriate and Good  Engagement in Group:  Engaged and Supportive  Modes of Intervention:  Discussion and Support  Additional Comments:     Belva Crome 07/30/2023, 9:34 PM

## 2023-07-30 NOTE — Consult Note (Signed)
[facility] Consult Note  Patient Name: Chad Walsh MRN: [id number] DOB: 1981-01-21 DATE OF Consult: 07/30/2023  PRIMARY PSYCHIATRIC DIAGNOSES  1.  Major Depressive Disorder, Recurrent, Severe, without Psychotic Features 2.  Alcohol Use Disorder, Severe, Dependence 3.  Suicidal Ideations 4. R/O Substance Induced Mood Disorder   RECOMMENDATIONS  Recommendations: Medication recommendations:  -- CIWA protocol; Ativan 1mg  po Q4H PRN for CIWA above 8 -- Thiamine 100mg  po daily, Folic Acid 1mg  po daily, Multi-Vitamin 1 tablet po daily in the setting of alcohol withdrawal  -- Hydroxyzine 25mg  po TID PNR for anxiety -- Haldol 5mg  PO/IM Q6H PRN for acute agitation, Ativan 2mg  PO/IM Q6H PRN for acute agitation, Benadryl 25mg  PO/IM Q6H PRN for acute agitation/ EPS  Non-Medication/therapeutic recommendations:  -- Uphold IVC petition -- Psychiatric Admission- Dual Diagnosis Treatment -- Monitor withdrawal symptoms; CIWA  -- Comfort measures, hydration   Is inpatient psychiatric hospitalization recommended for this patient?  -- Yes, patient is high risk for suicide   Communication: Treatment team members (and family members if applicable) who were involved in treatment/care discussions and planning, and with whom we spoke or engaged with via secure text/chat, include the following: Dr. Dolores Frame and ED team   Thank you for involving [place] in the care of this patient. If you have any additional questions or concerns, please call [phone] and ask for me or the provider on-call.  TELEPSYCHIATRY ATTESTATION & CONSENT  As the provider for this telehealth consult, I attest that I verified the patient's identity using two separate identifiers, introduced myself to the patient, provided my credentials, disclosed my location, and performed this encounter via a HIPAA-compliant, real-time, face-to-face, two-way, interactive audio and video platform and with the full consent and agreement of the  patient (or guardian as applicable.)  Patient physical location: ED in [facility]  Telehealth provider physical location: home office in state of [place].  Video start time: 0241 (Central Time) Video end time: 0305 (Central Time)  IDENTIFYING DATA  Chad Walsh is a 42 y.o. year-old male for whom a psychiatric consultation has been ordered by the primary provider. The patient was identified using two separate identifiers.  CHIEF COMPLAINT/REASON FOR CONSULT  Suicidal Ideation   HISTORY OF PRESENT ILLNESS (HPI)  The patient is a 42yo male who presented to the emergency department via EMS with concern for alcohol withdrawal, worsening depression and suicidal ideations.   Patient states 1 week ago he relapsed on alcohol after a few months of sobriety. Has been trying to quit drinking for the last 15 months. 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 Patient states he drinks a fifth and a half of liquor daily. Has been to detox multiple times, no rehabilitation programs. No legal issues associated with alcohol. Denies history of seizures throughout withdrawal, no history of hallucinosis. Last drink was a few hours prior to arrival. BAL 356 upon arrival. Currently endorsing anxiety and "body shakes". Has active cravings to drink alcohol to stop the pain of withdrawal. Denies drug use.   Patient states his relapsed was not triggered by any acute stressors. Patient endorses chronic, daily depression since his mother passed away a few years ago. Lately has been thinking about suicide "quite a bit". States he has had chronic ideations, doesn't think he would do it, but has developed a plan recently to jump off a bridge. Patient states his plan is "crappy" and states he has always wanted a gun for his protection but is afraid  he would use it on himself. He has an attempt in the past by overdose on medications. Patient feels there is a darkness over him. He continues to  endorse active SI this morning, wants to get out of his situation. No homicidal ideations. Endorses lethargy, hopelessness, helplessness, worthlessness. No psychosis present. He does not appear overtly psychotic. No symptoms indicative of mania.     PAST PSYCHIATRIC HISTORY  No past psychiatric hospitalizations  No outpatient psychiatric services established. [facility] in past; released him few months ago and was released to [facility]  No current psychotropic medications. Previously on Wellbutrin and another anxiolytic he can't remember.    Otherwise as per HPI above.  PAST MEDICAL HISTORY  Past Medical History:  Diagnosis Date   Anxiety    Depression    ETOH abuse    GERD (gastroesophageal reflux disease)      HOME MEDICATIONS  Facility Ordered Medications  Medication   [COMPLETED] haloperidol lactate (HALDOL) injection 2 mg   thiamine (VITAMIN B1) tablet 100 mg   Or   thiamine (VITAMIN B1) injection 100 mg   folic acid (FOLVITE) tablet 1 mg   multivitamin with minerals tablet 1 tablet   [COMPLETED] ondansetron (ZOFRAN) injection 4 mg   [COMPLETED] LORazepam (ATIVAN) tablet 1 mg   [COMPLETED] hydrOXYzine (ATARAX) tablet 50 mg   PTA Medications  Medication Sig   Multiple Vitamins-Minerals (MULTIVITAMIN MEN PO) Take by mouth daily. (Patient not taking: Reported on 07/29/2023)   Cholecalciferol (VITAMIN D3) 50 MCG (2000 UT) CAPS Take by mouth daily. (Patient not taking: Reported on 07/29/2023)   busPIRone (BUSPAR) 10 MG tablet Take 10 mg by mouth 3 (three) times daily. (Patient not taking: Reported on 07/29/2023)   buPROPion (WELLBUTRIN XL) 150 MG 24 hr tablet Take 150 mg by mouth daily. In the afternoon (Patient not taking: Reported on 07/29/2023)   buPROPion (WELLBUTRIN XL) 300 MG 24 hr tablet Take 300 mg by mouth in the morning. 300 mg in the morning 150 mg in the afternoon (Patient not taking: Reported on 07/29/2023)   chlordiazePOXIDE (LIBRIUM) 25 MG capsule Take 2 capsules by mouth every  6 hours for 1 day, then 2 caps every 8 hours for 1 day, then 2 caps every 12 hours for 1 day, then 1 cap at night the final day (Patient not taking: Reported on 07/29/2023)     ALLERGIES  No Known Allergies  SOCIAL & SUBSTANCE USE HISTORY  Social History   Socioeconomic History   Marital status: Single    Spouse name: Not on file   Number of children: Not on file   Years of education: Not on file   Highest education level: Not on file  Occupational History   Not on file  Tobacco Use   Smoking status: Former    Current packs/day: 0.00    Average packs/day: 0.5 packs/day for 18.0 years (9.0 ttl pk-yrs)    Types: Cigarettes    Start date: 03/25/2002    Quit date: 03/25/2020    Years since quitting: 3.3   Smokeless tobacco: Never  Vaping Use   Vaping status: Never Used  Substance and Sexual Activity   Alcohol use: Yes    Alcohol/week: 28.0 standard drinks of alcohol    Types: 28 Standard drinks or equivalent per week    Comment: 09/25/20 - pt reports no alcohol last 2 weeks   Drug use: Not Currently    Types: Marijuana    Comment: early 2021 last used   Sexual  activity: Not on file  Other Topics Concern   Not on file  Social History Narrative   Not on file   Social Determinants of Health   Financial Resource Strain: Not on file  Food Insecurity: Food Insecurity Present (03/01/2023)   Hunger Vital Sign    Worried About Running Out of Food in the Last Year: Sometimes true    Ran Out of Food in the Last Year: Sometimes true  Transportation Needs: No Transportation Needs (03/01/2023)   PRAPARE - Administrator, Civil Service (Medical): No    Lack of Transportation (Non-Medical): No  Physical Activity: Not on file  Stress: Not on file  Social Connections: Not on file   Social History   Tobacco Use  Smoking Status Former   Current packs/day: 0.00   Average packs/day: 0.5 packs/day for 18.0 years (9.0 ttl pk-yrs)   Types: Cigarettes   Start date: 03/25/2002   Quit  date: 03/25/2020   Years since quitting: 3.3  Smokeless Tobacco Never   Social History   Substance and Sexual Activity  Alcohol Use Yes   Alcohol/week: 28.0 standard drinks of alcohol   Types: 28 Standard drinks or equivalent per week   Comment: 09/25/20 - pt reports no alcohol last 2 weeks   Social History   Substance and Sexual Activity  Drug Use Not Currently   Types: Marijuana   Comment: early 2021 last used    Additional pertinent information: lives alone, employed at [employer]     FAMILY HISTORY  Family History  Problem Relation Age of Onset   Lung cancer Mother    Family Psychiatric History (if known): None disclosed at this time    MENTAL STATUS EXAM (MSE)  Presentation  General Appearance:  Appropriate for Environment  Eye Contact: Fleeting  Speech: Clear and Coherent  Speech Volume: Decreased  Handedness: Right   Mood and Affect  Mood: Depressed  Affect: Congruent; Constricted   Thought Process  Thought Processes: Linear  Descriptions of Associations: Intact  Orientation:Full (Time, Place and Person)  Thought Content: Logical  History of Schizophrenia/Schizoaffective disorder:No data recorded Duration of Psychotic Symptoms:No data recorded Hallucinations:Hallucinations: None  Ideas of Reference: None  Suicidal Thoughts:Suicidal Thoughts: Yes, Active SI Active Intent and/or Plan: With Plan  Homicidal Thoughts:Homicidal Thoughts: No   Sensorium  Memory: Recent Good; Remote Good  Judgment: Poor  Insight: Poor   Executive Functions  Concentration: Fair  Attention Span: Fair  Recall: Fair  Fund of Knowledge: Fair  Language: Good   Psychomotor Activity  Psychomotor Activity:Psychomotor Activity: Normal  Assets  Assets: Communication Skills; Desire for Improvement; Housing; Health and safety inspector; Vocational/Educational   Sleep  Sleep:Sleep: Poor   VITALS  Blood pressure (!) 151/87,  pulse (!) 101, temperature 99.2 F (37.3 C), temperature source Oral, resp. rate 16, height 5\' 8"  (1.727 m), weight 113 kg, SpO2 98%.  LABS  Admission on 07/29/2023  Component Date Value Ref Range Status   Sodium 07/29/2023 140  135 - 145 mmol/L Final   Potassium 07/29/2023 3.7  3.5 - 5.1 mmol/L Final   Chloride 07/29/2023 103  98 - 111 mmol/L Final   CO2 07/29/2023 25  22 - 32 mmol/L Final   Glucose, Bld 07/29/2023 131 (H)  70 - 99 mg/dL Final   Glucose reference range applies only to samples taken after fasting for at least 8 hours.   BUN 07/29/2023 15  6 - 20 mg/dL Final   Creatinine, Ser 07/29/2023 0.86  0.61 -  1.24 mg/dL Final   Calcium 40/98/1191 8.7 (L)  8.9 - 10.3 mg/dL Final   Total Protein 47/82/9562 8.2 (H)  6.5 - 8.1 g/dL Final   Albumin 13/03/6577 4.5  3.5 - 5.0 g/dL Final   AST 46/96/2952 26  15 - 41 U/L Final   ALT 07/29/2023 25  0 - 44 U/L Final   Alkaline Phosphatase 07/29/2023 93  38 - 126 U/L Final   Total Bilirubin 07/29/2023 1.2 (H)  <1.2 mg/dL Final   GFR, Estimated 07/29/2023 >60  >60 mL/min Final   Comment: (NOTE) Calculated using the CKD-EPI Creatinine Equation (2021)    Anion gap 07/29/2023 12  5 - 15 Final   Performed at [facility], [address] [place], [place] [address]   Alcohol, Ethyl (B) 07/29/2023 356 (HH)  <10 mg/dL Final   Comment: CRITICAL RESULT CALLED TO, READ BACK BY AND VERIFIED WITH HANNAH KOEIN AT 1523 ON 07/29/23 BY SS (NOTE) Lowest detectable limit for serum alcohol is 10 mg/dL.  For medical purposes only. Performed at [facility], [address] [place], [place] [address]    Salicylate Lvl 07/29/2023 <7.0 (L)  7.0 - 30.0 mg/dL Final   Performed at [facility], [address] [place], [place] [address]   Acetaminophen (Tylenol), Serum 07/29/2023 <10 (L)  10 - 30 ug/mL Final   Comment: (NOTE) Therapeutic concentrations vary significantly. A range of 10-30 ug/mL  may be an effective concentration  for many patients. However, some  are best treated at concentrations outside of this range. Acetaminophen concentrations >150 ug/mL at 4 hours after ingestion  and >50 ug/mL at 12 hours after ingestion are often associated with  toxic reactions.  Performed at [facility], [address] [place], [place] [address]    WBC 07/29/2023 8.4  4.0 - 10.5 K/uL Final   RBC 07/29/2023 5.23  4.22 - 5.81 MIL/uL Final   Hemoglobin 07/29/2023 14.8  13.0 - 17.0 g/dL Final   HCT 34/74/2595 44.4  39.0 - 52.0 % Final   MCV 07/29/2023 84.9  80.0 - 100.0 fL Final   MCH 07/29/2023 28.3  26.0 - 34.0 pg Final   MCHC 07/29/2023 33.3  30.0 - 36.0 g/dL Final   RDW 63/87/5643 15.0  11.5 - 15.5 % Final   Platelets 07/29/2023 401 (H)  150 - 400 K/uL Final   nRBC 07/29/2023 0.0  0.0 - 0.2 % Final   Performed at [facility], [address] [place], [place] [address]   Tricyclic, Ur Screen 07/29/2023 NONE DETECTED  NONE DETECTED Final   Amphetamines, Ur Screen 07/29/2023 NONE DETECTED  NONE DETECTED Final   MDMA (Ecstasy)Ur Screen 07/29/2023 NONE DETECTED  NONE DETECTED Final   Cocaine Metabolite,Ur [place] 07/29/2023 NONE DETECTED  NONE DETECTED Final   Opiate, Ur Screen 07/29/2023 NONE DETECTED  NONE DETECTED Final   Phencyclidine (PCP) Ur S 07/29/2023 NONE DETECTED  NONE DETECTED Final   Cannabinoid 50 Ng, Ur [place] 07/29/2023 NONE DETECTED  NONE DETECTED Final   Barbiturates, Ur Screen 07/29/2023 NONE DETECTED  NONE DETECTED Final   Benzodiazepine, Ur Scrn 07/29/2023 NONE DETECTED  NONE DETECTED Final   Methadone Scn, Ur 07/29/2023 NONE DETECTED  NONE DETECTED Final   Comment: (NOTE) Tricyclics + metabolites, urine    Cutoff 1000 ng/mL Amphetamines + metabolites, urine  Cutoff 1000 ng/mL MDMA (Ecstasy), urine              Cutoff 500 ng/mL Cocaine Metabolite, urine  Cutoff 300 ng/mL Opiate + metabolites, urine        Cutoff 300 ng/mL Phencyclidine (PCP), urine         Cutoff 25  ng/mL Cannabinoid, urine                 Cutoff 50 ng/mL Barbiturates + metabolites, urine  Cutoff 200 ng/mL Benzodiazepine, urine              Cutoff 200 ng/mL Methadone, urine                   Cutoff 300 ng/mL  The urine drug screen provides only a preliminary, unconfirmed analytical test result and should not be used for non-medical purposes. Clinical consideration and professional judgment should be applied to any positive drug screen result due to possible interfering substances. A more specific alternate chemical method must be used in order to obtain a confirmed analytical result. Gas chromatography / mass spectrometry (GC/MS) is the preferred confirm                          atory method. Performed at [facility], [address], [place], [place] [address]     PSYCHIATRIC REVIEW OF SYSTEMS (ROS)  ROS: Notable for the following relevant positive findings: Review of Systems  Psychiatric/Behavioral:  Positive for depression, substance abuse and suicidal ideas. The patient is nervous/anxious and has insomnia.     Additional findings:      Musculoskeletal: No abnormal movements observed      Gait & Station: Laying/Sitting      Pain Screening: Present - mild to moderate      Nutrition & Dental Concerns: Decrease in food intake and/or loss of appetite  RISK FORMULATION/ASSESSMENT  Is the patient experiencing any suicidal or homicidal ideations: Yes       Explain if yes: SI with plan to jump off bridge Protective factors considered for safety management: access to care, willingness to seek treatment, housing, employment   Risk factors/concerns considered for safety management:  Prior attempt Depression Substance abuse/dependence Hopelessness Isolation Barriers to accessing treatment Male gender Unmarried  Is there a safety management plan with the patient and treatment team to minimize risk factors and promote protective factors: Yes           Explain:  currently in the ED, medication management, comfort measures, IVC petition, inpatient admission Is crisis care placement or psychiatric hospitalization recommended: Yes     Based on my current evaluation and risk assessment, patient is determined at this time to be at:  High risk  *RISK ASSESSMENT Risk assessment is a dynamic process; it is possible that this patient's condition, and risk level, may change. This should be re-evaluated and managed over time as appropriate. Please re-consult psychiatric consult services if additional assistance is needed in terms of risk assessment and management. If your team decides to discharge this patient, please advise the patient how to best access emergency psychiatric services, or to call 911, if their condition worsens or they feel unsafe in any way.   Assunta Gambles, NP Telepsychiatry Consult Services

## 2023-07-30 NOTE — ED Notes (Signed)
Pt taking shower. Pt was given hygiene items and the following, 1 clean top, 1 clean bottom, with 1 pair of disposable underwear.  Pt changed out into clean clothing.  Staff disposed of all shower supplies.   

## 2023-07-30 NOTE — Tx Team (Signed)
Initial Treatment Plan 07/30/2023 4:09 PM HERNAN DOELLING ZOX:[id number]    PATIENT STRESSORS: Loss of mother   Medication change or noncompliance   Substance abuse   Traumatic event     PATIENT STRENGTHS: Ability for insight  Average or above average intelligence  Capable of independent living  Communication skills  Financial means  General fund of knowledge  Motivation for treatment/growth  Physical Health  Supportive family/friends  Work skills    PATIENT IDENTIFIED PROBLEMS: Substance use disorder  Depression  Isolation                 DISCHARGE CRITERIA:  Improved stabilization in mood, thinking, and/or behavior Need for constant or close observation no longer present Verbal commitment to aftercare and medication compliance Withdrawal symptoms are absent or subacute and managed without 24-hour nursing intervention  PRELIMINARY DISCHARGE PLAN: Attend 12-step recovery group Outpatient therapy Return to previous living arrangement Return to previous work or school arrangements  PATIENT/FAMILY INVOLVEMENT: This treatment plan has been presented to and reviewed with the patient, Chad Walsh.  The patient has been given the opportunity to ask questions and make suggestions.  Delos Haring, RN 07/30/2023, 4:09 PM

## 2023-07-30 NOTE — ED Notes (Signed)
Completed telepsych assessment with provider. RN provided pt phone to call supervisor to call out of work as requested.

## 2023-07-30 NOTE — ED Provider Notes (Signed)
Emergency Medicine Observation Re-evaluation Note  Chad Walsh is a 42 y.o. male, seen on rounds today.  Pt initially presented to the ED for complaints of Alcohol Intoxication and Suicidal Currently, the patient is resting, voices no medical complaints.  Physical Exam  BP (!) 151/87   Pulse (!) 101   Temp 99.2 F (37.3 C) (Oral)   Resp 16   Ht 5\' 8"  (1.727 m)   Wt 113 kg   SpO2 98%   BMI 37.88 kg/m  Physical Exam General: Resting in no acute distress Cardiac: No cyanosis Lungs: Equal rise and fall Psych: Not agitated  ED Course / MDM  EKG:   I have reviewed the labs performed to date as well as medications administered while in observation.  Recent changes in the last 24 hours include no events overnight.  Plan  Current plan is for inpatient admission later this morning.    Irean Hong, MD 07/30/23 [phone]

## 2023-07-30 NOTE — ED Notes (Signed)
Pt provided with lunch tray. Pt sitting up eating.

## 2023-07-30 NOTE — [facility] Suicide Risk Assessment (Signed)
[facility] INPATIENT:  Family/Significant Other Suicide Prevention Education  Suicide Prevention Education:  Patient Refusal for Family/Significant Other Suicide Prevention Education: The patient Chad Walsh has refused to provide written consent for family/significant other to be provided Family/Significant Other Suicide Prevention Education during admission and/or prior to discharge.  Physician notified.  Lowry Ram 07/30/2023, 4:13 PM

## 2023-07-30 NOTE — Group Note (Signed)
Recreation Therapy Group Note   Group Topic:Communication  Group Date: 07/30/2023 Start Time: 1530 End Time: 1630 Facilitators: Clinton Gallant, CTRS Location:  Craft Room  Group Description: Emotional Check in. Patient sat and talked with LRT about how they are doing and whatever else is on their mind. LRT provided active listening, reassurance and encouragement. Pts were given the opportunity to listen to music or make holiday themed arts and crafts.   Goal Area(s) Addressed: Patient will engage in conversation with LRT. Patient will communicate their wants, needs, or questions.  Patient will practice a new coping skill of "talking to someone".   Affect/Mood: N/A   Participation Level: Did not attend    Clinical Observations/Individualized Feedback: Patient did not attend group.  Plan: Continue to engage patient in RT group sessions 2-3x/week.   Rosina Lowenstein, LRT, CTRS 07/30/2023 5:43 PM

## 2023-07-31 DIAGNOSIS — F332 Major depressive disorder, recurrent severe without psychotic features: Secondary | ICD-10-CM | POA: Diagnosis not present

## 2023-07-31 MED ORDER — BUPROPION HCL ER (XL) 150 MG PO TB24
150.0000 mg | ORAL_TABLET | Freq: Every day | ORAL | Status: DC
  Administered 2023-07-31 – 2023-08-03 (×4): 150 mg via ORAL
  Filled 2023-07-31 (×4): qty 1

## 2023-07-31 MED ORDER — VITAMIN D3 25 MCG (1000 UNIT) PO TABS
2000.0000 [IU] | ORAL_TABLET | Freq: Every day | ORAL | Status: DC
  Administered 2023-07-31 – 2023-08-03 (×4): 2000 [IU] via ORAL
  Filled 2023-07-31 (×8): qty 2

## 2023-07-31 NOTE — [facility] Suicide Risk Assessment (Signed)
[facility] Admission Suicide Risk Assessment   Nursing information obtained from:  Patient Demographic factors:  Male, Caucasian, Living alone Current Mental Status:  NA Loss Factors:  Loss of significant relationship Historical Factors:  Impulsivity, Family history of mental illness or substance abuse Risk Reduction Factors:  Employed  Total Time spent with patient: 30 minutes Principal Problem: MDD (major depressive disorder), recurrent severe, without psychosis (HCC) Diagnosis:  Principal Problem:   MDD (major depressive disorder), recurrent severe, without psychosis (HCC) Active Problems:   Alcohol withdrawal (HCC)   Alcohol use disorder, severe, dependence (HCC)  Subjective Data: 42 year old male admitted on an IVC with symptoms of alcohol withdrawal syndrome, worsening depression and suicidal ideations.  Continued Clinical Symptoms:  Alcohol Use Disorder Identification Test Final Score (AUDIT): 31 The "Alcohol Use Disorders Identification Test", Guidelines for Use in Primary Care, Second Edition.  World Science writer [facility]). Score between 0-7:  no or low risk or alcohol related problems. Score between 8-15:  moderate risk of alcohol related problems. Score between 16-19:  high risk of alcohol related problems. Score 20 or above:  warrants further diagnostic evaluation for alcohol dependence and treatment.   CLINICAL FACTORS:   Depression:   Impulsivity Insomnia Unstable or Poor Therapeutic Relationship Previous Psychiatric Diagnoses and Treatments   Musculoskeletal: Strength & Muscle Tone: within normal limits Gait & Station: normal Patient leans: N/A  Psychiatric Specialty Exam:  Presentation  General Appearance:  Appropriate for Environment  Eye Contact: Fleeting  Speech: Clear and Coherent  Speech Volume: Decreased  Handedness: Right   Mood and Affect  Mood: Depressed  Affect: Congruent; Constricted   Thought Process  Thought  Processes: Linear  Descriptions of Associations:Intact  Orientation:Full (Time, Place and Person)  Thought Content:Logical  History of Schizophrenia/Schizoaffective disorder:No data recorded Duration of Psychotic Symptoms:No data recorded Hallucinations:Hallucinations: None  Ideas of Reference:None  Suicidal Thoughts:Suicidal Thoughts: Yes, Active SI Active Intent and/or Plan: With Plan  Homicidal Thoughts:Homicidal Thoughts: No   Sensorium  Memory: Recent Good; Remote Good  Judgment: Poor  Insight: Poor   Executive Functions  Concentration: Fair  Attention Span: Fair  Recall: Fair  Fund of Knowledge: Fair  Language: Good   Psychomotor Activity  Psychomotor Activity: Psychomotor Activity: Normal   Assets  Assets: Communication Skills; Desire for Improvement; Housing; Health and safety inspector; Vocational/Educational   Sleep  Sleep: Sleep: Poor    Physical Exam: Physical Exam Constitutional:      Appearance: Normal appearance. He is normal weight.  Neurological:     General: No focal deficit present.     Mental Status: He is alert and oriented to person, place, and time. Mental status is at baseline.    Review of Systems  Psychiatric/Behavioral:  Positive for depression, substance abuse and suicidal ideas. The patient is nervous/anxious and has insomnia.   All other systems reviewed and are negative.  Blood pressure 126/64, pulse 89, temperature 97.9 F (36.6 C), resp. rate 20, height 5\' 8"  (1.727 m), weight 105.2 kg, SpO2 97%. Body mass index is 35.28 kg/m.   COGNITIVE FEATURES THAT CONTRIBUTE TO RISK:  Polarized thinking and Thought constriction (tunnel vision)    SUICIDE RISK:   Moderate:  Frequent suicidal ideation with limited intensity, and duration, some specificity in terms of plans, no associated intent, good self-control, limited dysphoria/symptomatology, some risk factors present, and identifiable protective factors,  including available and accessible social support.  PLAN OF CARE: The patient is admitted with worsening symptoms of depression and alcoholism with withdrawal symptoms.  This is  also the anniversary of the loss of his mother.  I certify that inpatient services furnished can reasonably be expected to improve the patient's condition.   Rex Kras, MD 07/31/2023, 12:26 PM

## 2023-07-31 NOTE — Plan of Care (Signed)
  Problem: Education: Goal: Knowledge of [facility] Education information/materials will improve Outcome: Progressing Goal: Emotional status will improve Outcome: Progressing Goal: Mental status will improve Outcome: Progressing Goal: Verbalization of understanding the information provided will improve Outcome: Progressing   Problem: Activity: Goal: Interest or engagement in activities will improve Outcome: Progressing Goal: Sleeping patterns will improve Outcome: Progressing   Problem: Coping: Goal: Ability to verbalize frustrations and anger appropriately will improve Outcome: Progressing Goal: Ability to demonstrate self-control will improve Outcome: Progressing   Problem: Health Behavior/Discharge Planning: Goal: Identification of resources available to assist in meeting health care needs will improve Outcome: Progressing Goal: Compliance with treatment plan for underlying cause of condition will improve Outcome: Progressing   Problem: Physical Regulation: Goal: Ability to maintain clinical measurements within normal limits will improve Outcome: Progressing   Problem: Safety: Goal: Periods of time without injury will increase Outcome: Progressing   Problem: Education: Goal: Knowledge of disease or condition will improve Outcome: Progressing Goal: Understanding of discharge needs will improve Outcome: Progressing   Problem: Health Behavior/Discharge Planning: Goal: Ability to identify changes in lifestyle to reduce recurrence of condition will improve Outcome: Progressing Goal: Identification of resources available to assist in meeting health care needs will improve Outcome: Progressing   Problem: Physical Regulation: Goal: Complications related to the disease process, condition or treatment will be avoided or minimized Outcome: Progressing   Problem: Safety: Goal: Ability to remain free from injury will improve Outcome: Progressing   Problem:  Education: Goal: Utilization of techniques to improve thought processes will improve Outcome: Progressing Goal: Knowledge of the prescribed therapeutic regimen will improve Outcome: Progressing   Problem: Activity: Goal: Interest or engagement in leisure activities will improve Outcome: Progressing Goal: Imbalance in normal sleep/wake cycle will improve Outcome: Progressing   Problem: Coping: Goal: Coping ability will improve Outcome: Progressing Goal: Will verbalize feelings Outcome: Progressing   Problem: Health Behavior/Discharge Planning: Goal: Ability to make decisions will improve Outcome: Progressing Goal: Compliance with therapeutic regimen will improve Outcome: Progressing   Problem: Role Relationship: Goal: Will demonstrate positive changes in social behaviors and relationships Outcome: Progressing   Problem: Safety: Goal: Ability to disclose and discuss suicidal ideas will improve Outcome: Progressing Goal: Ability to identify and utilize support systems that promote safety will improve Outcome: Progressing   Problem: Self-Concept: Goal: Will verbalize positive feelings about self Outcome: Progressing Goal: Level of anxiety will decrease Outcome: Progressing   Problem: Education: Goal: Ability to make informed decisions regarding treatment will improve Outcome: Progressing   Problem: Coping: Goal: Coping ability will improve Outcome: Progressing   Problem: Health Behavior/Discharge Planning: Goal: Identification of resources available to assist in meeting health care needs will improve Outcome: Progressing   Problem: Medication: Goal: Compliance with prescribed medication regimen will improve Outcome: Progressing   Problem: Self-Concept: Goal: Ability to disclose and discuss suicidal ideas will improve Outcome: Progressing Goal: Will verbalize positive feelings about self Outcome: Progressing Note: Patient is initiating therapy. Patient will  maintain adherence

## 2023-07-31 NOTE — H&P (Signed)
Psychiatric Admission Assessment Adult  Patient Identification: Chad Walsh MRN:  [id number] Date of Evaluation:  07/31/2023 Chief Complaint:  MDD (major depressive disorder), recurrent severe, without psychosis (HCC) [F33.2] Principal Diagnosis: MDD (major depressive disorder), recurrent severe, without psychosis (HCC) Diagnosis:  Principal Problem:   MDD (major depressive disorder), recurrent severe, without psychosis (HCC) Active Problems:   Alcohol withdrawal (HCC)   Alcohol use disorder, severe, dependence (HCC) CC: The patient is a 42 year old male who was admitted on an IVC through the emergency department after being evaluated for depression, suicidal ideations and alcohol withdrawal symptoms.  History of Present Illness: The patient reports that he was in his usual state of health until about a week ago.  He relapsed on alcohol after maintaining sobriety for a few months.  Apparently has been trying to stop drinking for over 15 months and admits to drinking about 1/5 and a half of liquor daily.  He has had multiple detoxes but apparently relapses.  He does have a history of withdrawals and also delirium tremens in the past.  When he was seen in the ED his blood alcohol was 356.  He admitted to some withdrawal symptoms but no hallucinations or delusions. Recent stressors include the anniversary of his mother's death a few years ago.  He has also been thinking of suicide and reports that he has chronic suicidal thoughts but felt that he might jump off the bridge or get a gun.  He is afraid that he might act on his impulses.  When seen today the patient was alert and oriented cooperative and pleasant but quite withdrawn.  In treatment team he endorsed ongoing depression, grief reaction and the relapse.  Apparently his mother died 3 years ago and around this time of the anniversary of her death.  He started drinking around Thanksgiving and reports that he never stopped. The patient is  single and has never been married.  He lives on his own has an apartment and has a stable job.  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  He does go to [facility] for treatment but recently stopped going there and stopped taking his medications.  He was on Wellbutrin and he would like to restart that. Today is contracting for safety.  No active SI/HI/AVH.  Mild withdrawal symptoms noted. Associated Signs/Symptoms: Depression Symptoms:  depressed mood, psychomotor retardation, hopelessness, recurrent thoughts of death, suicidal thoughts with specific plan, anxiety, (Hypo) Manic Symptoms:  Distractibility, Impulsivity, Anxiety Symptoms:  Excessive Worry, Psychotic Symptoms:   None noted PTSD Symptoms: Negative Total Time spent with patient: 30 minutes  Past Psychiatric History: The patient gives a past psychiatric history of alcohol detox with history of DTs.  He has services through the [facility] for SA IOP until recently.  He was on Wellbutrin that was discontinued because he could not see a psychiatrist.  Is the patient at risk to self? Yes.    Has the patient been a risk to self in the past 6 months? Yes.    Has the patient been a risk to self within the distant past? Yes.    Is the patient a risk to others? No.  Has the patient been a risk to others in the past 6 months? No.  Has the patient been a risk to others within the distant past? No.   [place] Scale:  Flowsheet Row Admission (Current) from 07/30/2023 in [facility] INPATIENT [facility] MEDICINE ED from 07/29/2023 in [facility] Emergency Department  at [facility] ED to Hosp-Admission (Discharged) from 03/01/2023 in [facility]ERY  C-SSRS RISK CATEGORY Low Risk Low Risk Low Risk        Prior Inpatient Therapy: Yes.   If yes, describe no record and unknown at this time. Prior Outpatient Therapy: Yes.   If yes, describe prior treatment with her HA.  Alcohol Screening: 1. How  often do you have a drink containing alcohol?: 4 or more times a week 2. How many drinks containing alcohol do you have on a typical day when you are drinking?: 10 or more 3. How often do you have six or more drinks on one occasion?: Daily or almost daily AUDIT-C Score: 12 4. How often during the last year have you found that you were not able to stop drinking once you had started?: Daily or almost daily 5. How often during the last year have you failed to do what was normally expected from you because of drinking?: Monthly 6. How often during the last year have you needed a first drink in the morning to get yourself going after a heavy drinking session?: Weekly 7. How often during the last year have you had a feeling of guilt of remorse after drinking?: Daily or almost daily 8. How often during the last year have you been unable to remember what happened the night before because you had been drinking?: Daily or almost daily 9. Have you or someone else been injured as a result of your drinking?: No 10. Has a relative or friend or a doctor or another health worker been concerned about your drinking or suggested you cut down?: Yes, but not in the last year Alcohol Use Disorder Identification Test Final Score (AUDIT): 31 Alcohol Brief Interventions/Follow-up: Alcohol education/Brief advice Substance Abuse History in the last 12 months:  Yes.   Consequences of Substance Abuse: Blackouts:  Gives history of DTs and blackouts. DT's: Gives history of DTs in the past. Withdrawal Symptoms:   Headaches Nausea Tremors Previous Psychotropic Medications: Yes  Psychological Evaluations: Yes  Past Medical History:  Past Medical History:  Diagnosis Date   Anxiety    Depression    ETOH abuse    GERD (gastroesophageal reflux disease)     Past Surgical History:  Procedure Laterality Date   ESOPHAGOGASTRODUODENOSCOPY (EGD) WITH PROPOFOL N/A 11/13/2021   Procedure: ESOPHAGOGASTRODUODENOSCOPY (EGD) WITH  PROPOFOL;  Surgeon: Toney Reil, MD;  Location: [facility] ENDOSCOPY;  Service: Gastroenterology;  Laterality: N/A;   ORIF ANKLE FRACTURE Left 09/27/2020   Procedure: OPEN REDUCTION INTERNAL FIXATION (ORIF) ANKLE FRACTURE, SYNDOSIS REPAIR;  Surgeon: Signa Kell, MD;  Location: [facility] ORS;  Service: Orthopedics;  Laterality: Left;   TONSILLECTOMY     WISDOM TOOTH EXTRACTION     Family History:  Family History  Problem Relation Age of Onset   Lung cancer Mother    Family Psychiatric  History: Unknown at this time. Tobacco Screening:  Social History   Tobacco Use  Smoking Status Former   Current packs/day: 0.00   Average packs/day: 0.5 packs/day for 18.0 years (9.0 ttl pk-yrs)   Types: Cigarettes   Start date: 03/25/2002   Quit date: 03/25/2020   Years since quitting: 3.3  Smokeless Tobacco Never    BH Tobacco Counseling     Are you interested in Tobacco Cessation Medications?  N/A, patient does not use tobacco products Counseled patient on smoking cessation:  N/A, patient does not use tobacco products Reason Tobacco Screening Not Completed: No value  filed.       Social History:  Social History   Substance and Sexual Activity  Alcohol Use Yes   Alcohol/week: 28.0 standard drinks of alcohol   Types: 28 Standard drinks or equivalent per week   Comment: 09/25/20 - pt reports no alcohol last 2 weeks     Social History   Substance and Sexual Activity  Drug Use Not Currently   Types: Marijuana   Comment: early 2021 last used    Additional Social History: Marital status: Single Are you sexually active?: No What is your sexual orientation?: "Straight" Has your sexual activity been affected by drugs, alcohol, medication, or emotional stress?: "Yes" Does patient have children?: No                         Allergies:  No Known Allergies Lab Results:  Results for orders placed or performed during the hospital encounter of 07/29/23 (from the past 48 hour(s))   Comprehensive metabolic panel     Status: Abnormal   Collection Time: 07/29/23  2:40 PM  Result Value Ref Range   Sodium 140 135 - 145 mmol/L   Potassium 3.7 3.5 - 5.1 mmol/L   Chloride 103 98 - 111 mmol/L   CO2 25 22 - 32 mmol/L   Glucose, Bld 131 (H) 70 - 99 mg/dL    Comment: Glucose reference range applies only to samples taken after fasting for at least 8 hours.   BUN 15 6 - 20 mg/dL   Creatinine, Ser 2.13 0.61 - 1.24 mg/dL   Calcium 8.7 (L) 8.9 - 10.3 mg/dL   Total Protein 8.2 (H) 6.5 - 8.1 g/dL   Albumin 4.5 3.5 - 5.0 g/dL   AST 26 15 - 41 U/L   ALT 25 0 - 44 U/L   Alkaline Phosphatase 93 38 - 126 U/L   Total Bilirubin 1.2 (H) <1.2 mg/dL   GFR, Estimated >08 >65 mL/min    Comment: (NOTE) Calculated using the CKD-EPI Creatinine Equation (2021)    Anion gap 12 5 - 15    Comment: Performed at [facility], [address] [place], [place] [address]  Ethanol     Status: Abnormal   Collection Time: 07/29/23  2:40 PM  Result Value Ref Range   Alcohol, Ethyl (B) 356 (HH) <10 mg/dL    Comment: CRITICAL RESULT CALLED TO, READ BACK BY AND VERIFIED WITH HANNAH KOEIN AT 1523 ON 07/29/23 BY SS (NOTE) Lowest detectable limit for serum alcohol is 10 mg/dL.  For medical purposes only. Performed at [facility], [address] [place], [place] [address]   Salicylate level     Status: Abnormal   Collection Time: 07/29/23  2:40 PM  Result Value Ref Range   Salicylate Lvl <7.0 (L) 7.0 - 30.0 mg/dL    Comment: Performed at [facility], [address] [place], [place] [address]  Acetaminophen level     Status: Abnormal   Collection Time: 07/29/23  2:40 PM  Result Value Ref Range   Acetaminophen (Tylenol), Serum <10 (L) 10 - 30 ug/mL    Comment: (NOTE) Therapeutic concentrations vary significantly. A range of 10-30 ug/mL  may be an effective concentration for many patients. However, some  are best treated at concentrations outside of this  range. Acetaminophen concentrations >150 ug/mL at 4 hours after ingestion  and >50 ug/mL at 12 hours after ingestion are often associated with  toxic reactions.  Performed at [facility], [address] [place], [place] [address]   cbc     Status: Abnormal   Collection Time: 07/29/23  2:40 PM  Result Value Ref Range   WBC 8.4 4.0 - 10.5 K/uL   RBC 5.23 4.22 - 5.81 MIL/uL   Hemoglobin 14.8 13.0 - 17.0 g/dL   HCT 19.1 47.8 - 29.5 %   MCV 84.9 80.0 - 100.0 fL   MCH 28.3 26.0 - 34.0 pg   MCHC 33.3 30.0 - 36.0 g/dL   RDW 62.1 30.8 - 65.7 %   Platelets 401 (H) 150 - 400 K/uL   nRBC 0.0 0.0 - 0.2 %    Comment: Performed at [facility], [address], [place], [place] [address]  Urine Drug Screen, Qualitative     Status: None   Collection Time: 07/29/23  4:06 PM  Result Value Ref Range   Tricyclic, Ur Screen NONE DETECTED NONE DETECTED   Amphetamines, Ur Screen NONE DETECTED NONE DETECTED   MDMA (Ecstasy)Ur Screen NONE DETECTED NONE DETECTED   Cocaine Metabolite,Ur [place] NONE DETECTED NONE DETECTED   Opiate, Ur Screen NONE DETECTED NONE DETECTED   Phencyclidine (PCP) Ur S NONE DETECTED NONE DETECTED   Cannabinoid 50 Ng, Ur  NONE DETECTED NONE DETECTED   Barbiturates, Ur Screen NONE DETECTED NONE DETECTED   Benzodiazepine, Ur Scrn NONE DETECTED NONE DETECTED   Methadone Scn, Ur NONE DETECTED NONE DETECTED    Comment: (NOTE) Tricyclics + metabolites, urine    Cutoff 1000 ng/mL Amphetamines + metabolites, urine  Cutoff 1000 ng/mL MDMA (Ecstasy), urine              Cutoff 500 ng/mL Cocaine Metabolite, urine          Cutoff 300 ng/mL Opiate + metabolites, urine        Cutoff 300 ng/mL Phencyclidine (PCP), urine         Cutoff 25 ng/mL Cannabinoid, urine                 Cutoff 50 ng/mL Barbiturates + metabolites, urine  Cutoff 200 ng/mL Benzodiazepine, urine              Cutoff 200 ng/mL Methadone, urine                   Cutoff 300 ng/mL  The urine drug screen  provides only a preliminary, unconfirmed analytical test result and should not be used for non-medical purposes. Clinical consideration and professional judgment should be applied to any positive drug screen result due to possible interfering substances. A more specific alternate chemical method must be used in order to obtain a confirmed analytical result. Gas chromatography / mass spectrometry (GC/MS) is the preferred confirm atory method. Performed at [facility], [address] [place], [place] [address]     Blood Alcohol level:  Lab Results  Component Value Date   ETH 356 [facility]) 07/29/2023   ETH 40 (H) 03/01/2023    Metabolic Disorder Labs:  No results found for: "HGBA1C", "MPG" No results found for: "PROLACTIN" No results found for: "CHOL", "TRIG", "HDL", "CHOLHDL", "VLDL", "LDLCALC"  Current Medications: Current Facility-Administered Medications  Medication Dose Route Frequency Provider Last Rate Last Admin   acetaminophen (TYLENOL) tablet 500 mg  500 mg Oral Q6H PRN Onuoha, Chinwendu V, NP       haloperidol (HALDOL) tablet 5 mg  5 mg Oral TID PRN Onuoha, Chinwendu V, NP       And   diphenhydrAMINE (  BENADRYL) capsule 50 mg  50 mg Oral TID PRN Onuoha, Chinwendu V, NP   50 mg at 07/30/23 2207   haloperidol lactate (HALDOL) injection 5 mg  5 mg Intramuscular TID PRN Onuoha, Chinwendu V, NP       And   diphenhydrAMINE (BENADRYL) injection 50 mg  50 mg Intramuscular TID PRN Onuoha, Chinwendu V, NP       And   LORazepam (ATIVAN) injection 2 mg  2 mg Intramuscular TID PRN Onuoha, Chinwendu V, NP       haloperidol lactate (HALDOL) injection 10 mg  10 mg Intramuscular TID PRN Onuoha, Chinwendu V, NP       And   diphenhydrAMINE (BENADRYL) injection 50 mg  50 mg Intramuscular TID PRN Onuoha, Chinwendu V, NP       And   LORazepam (ATIVAN) injection 2 mg  2 mg Intramuscular TID PRN Onuoha, Chinwendu V, NP       gabapentin (NEURONTIN) capsule 300 mg  300 mg Oral TID Charm Rings, NP   300 mg at 07/31/23 0847   hydrOXYzine (ATARAX) tablet 25 mg  25 mg Oral Q6H PRN Onuoha, Chinwendu V, NP       loperamide (IMODIUM) capsule 2-4 mg  2-4 mg Oral PRN Onuoha, Chinwendu V, NP       LORazepam (ATIVAN) tablet 1 mg  1 mg Oral Q6H PRN Onuoha, Chinwendu V, NP       LORazepam (ATIVAN) tablet 1 mg  1 mg Oral QID Onuoha, Chinwendu V, NP   1 mg at 07/31/23 0847   Followed by   LORazepam (ATIVAN) tablet 1 mg  1 mg Oral TID Onuoha, Chinwendu V, NP       Followed by   Melene Muller ON 08/01/2023] LORazepam (ATIVAN) tablet 1 mg  1 mg Oral BID Onuoha, Chinwendu V, NP       Followed by   Melene Muller ON 08/03/2023] LORazepam (ATIVAN) tablet 1 mg  1 mg Oral Daily Onuoha, Chinwendu V, NP       magnesium hydroxide (MILK OF MAGNESIA) suspension 15 mL  15 mL Oral Daily PRN Onuoha, Chinwendu V, NP       multivitamin with minerals tablet 1 tablet  1 tablet Oral Daily Onuoha, Chinwendu V, NP   1 tablet at 07/31/23 0847   ondansetron (ZOFRAN-ODT) disintegrating tablet 4 mg  4 mg Oral Q6H PRN Onuoha, Chinwendu V, NP       thiamine (VITAMIN B1) tablet 100 mg  100 mg Oral Daily Onuoha, Chinwendu V, NP   100 mg at 07/31/23 0847   PTA Medications: Medications Prior to Admission  Medication Sig Dispense Refill Last Dose   buPROPion (WELLBUTRIN XL) 150 MG 24 hr tablet Take 150 mg by mouth daily. In the afternoon (Patient not taking: Reported on 07/29/2023)      buPROPion (WELLBUTRIN XL) 300 MG 24 hr tablet Take 300 mg by mouth in the morning. 300 mg in the morning 150 mg in the afternoon (Patient not taking: Reported on 07/29/2023)      busPIRone (BUSPAR) 10 MG tablet Take 10 mg by mouth 3 (three) times daily. (Patient not taking: Reported on 07/29/2023)      chlordiazePOXIDE (LIBRIUM) 25 MG capsule Take 2 capsules by mouth every 6 hours for 1 day, then 2 caps every 8 hours for 1 day, then 2 caps every 12 hours for 1 day, then 1 cap at night the final day (Patient not taking: Reported on 07/29/2023) 19 capsule 0  Cholecalciferol (VITAMIN D3) 50 MCG (2000 UT) CAPS Take by mouth daily. (Patient not taking: Reported on 07/29/2023)      Multiple Vitamins-Minerals (MULTIVITAMIN MEN PO) Take by mouth daily. (Patient not taking: Reported on 07/29/2023)       Musculoskeletal: Strength & Muscle Tone: within normal limits Gait & Station: normal Patient leans: N/A            Psychiatric Specialty Exam:  Presentation  General Appearance:  Casual; Disheveled  Eye Contact: Fair  Speech: Clear and Coherent  Speech Volume: Decreased  Handedness: Right   Mood and Affect  Mood: Anxious; Depressed  Affect: Congruent; Constricted   Thought Process  Thought Processes: Goal Directed  Duration of Psychotic Symptoms:N/A Past Diagnosis of Schizophrenia or Psychoactive disorder: No data recorded Descriptions of Associations:Intact  Orientation:Full (Time, Place and Person)  Thought Content:Rumination; Perseveration  Hallucinations:Hallucinations: None  Ideas of Reference:None  Suicidal Thoughts:Suicidal Thoughts: Yes, Passive SI Active Intent and/or Plan: With Plan  Homicidal Thoughts:Homicidal Thoughts: No   Sensorium  Memory: Immediate Fair; Remote Fair; Recent Fair  Judgment: Fair  Insight: Fair   Chartered certified accountant: Fair  Attention Span: Fair  Recall: [employer] of Knowledge: Fair  Language: Good   Psychomotor Activity  Psychomotor Activity: Psychomotor Activity: Normal   Assets  Assets: Desire for Improvement   Sleep  Sleep: Sleep: Fair    Physical Exam: Physical Exam Constitutional:      Appearance: Normal appearance. He is normal weight.  Neurological:     General: No focal deficit present.     Mental Status: He is alert and oriented to person, place, and time. Mental status is at baseline.    Review of Systems  Psychiatric/Behavioral:  Positive for depression, substance abuse and suicidal ideas. The  patient is nervous/anxious.   All other systems reviewed and are negative.  Blood pressure 126/64, pulse 89, temperature 97.9 F (36.6 C), resp. rate 20, height 5\' 8"  (1.727 m), weight 105.2 kg, SpO2 97%. Body mass index is 35.28 kg/m.  Treatment Plan Summary: Daily contact with patient to assess and evaluate symptoms and progress in treatment, Medication management, and Plan please see below.  Observation Level/Precautions:  15 minute checks  Laboratory:  CBC Chemistry Profile  Psychotherapy: Individual and group therapy.  Medications:   Restart Wellbutrin XR 150 mg a day and gradually titrated up to 450 which was his usual dose. Consider BuSpar 10 mg 3 times daily. Patient is on the detox protocol.  Consultations: As indicated  Discharge Concerns: Safety planning and outpatient follow-up  Estimated LOS: 5 to 7 days.  Other:     Physician Treatment Plan for Primary Diagnosis: MDD (major depressive disorder), recurrent severe, without psychosis (HCC) Long Term Goal(s): Improvement in symptoms so as ready for discharge  Short Term Goals: Ability to identify changes in lifestyle to reduce recurrence of condition will improve, Ability to verbalize feelings will improve, Ability to disclose and discuss suicidal ideas, and Compliance with prescribed medications will improve  Physician Treatment Plan for Secondary Diagnosis: Principal Problem:   MDD (major depressive disorder), recurrent severe, without psychosis (HCC) Active Problems:   Alcohol withdrawal (HCC)   Alcohol use disorder, severe, dependence (HCC)  Long Term Goal(s): Improvement in symptoms so as ready for discharge  Short Term Goals: Ability to verbalize feelings will improve, Ability to disclose and discuss suicidal ideas, Ability to demonstrate self-control will improve, Ability to maintain clinical measurements within normal limits will improve, and Ability to identify triggers  associated with substance abuse/mental  health issues will improve  I certify that inpatient services furnished can reasonably be expected to improve the patient's condition.    Rex Kras, MD 12/6/202412:34 PM Total Time Spent in Direct Patient Care:  I personally spent 30 minutes on the unit in direct patient care. The direct patient care time included face-to-face time with the patient, reviewing the patient's chart, communicating with other professionals, and coordinating care. Greater than 50% of this time was spent in counseling or coordinating care with the patient regarding goals of hospitalization, psycho-education, and discharge planning needs.   Rulon Eisenmenger [facility] Psychiatrist

## 2023-07-31 NOTE — Plan of Care (Signed)
Pt new to the unit, hasn't had time to progress    Problem: Education: Goal: Knowledge of [facility] [place] [facility] Education information/materials will improve Outcome: Not Progressing Goal: Emotional status will improve Outcome: Not Progressing Goal: Mental status will improve Outcome: Not Progressing Goal: Verbalization of understanding the information provided will improve Outcome: Not Progressing   Problem: Activity: Goal: Interest or engagement in activities will improve Outcome: Not Progressing Goal: Sleeping patterns will improve Outcome: Not Progressing   Problem: Coping: Goal: Ability to verbalize frustrations and anger appropriately will improve Outcome: Not Progressing Goal: Ability to demonstrate self-control will improve Outcome: Not Progressing   Problem: Health Behavior/Discharge Planning: Goal: Identification of resources available to assist in meeting health care needs will improve Outcome: Not Progressing Goal: Compliance with treatment plan for underlying cause of condition will improve Outcome: Not Progressing   Problem: Physical Regulation: Goal: Ability to maintain clinical measurements within normal limits will improve Outcome: Not Progressing   Problem: Safety: Goal: Periods of time without injury will increase Outcome: Not Progressing   Problem: Education: Goal: Knowledge of disease or condition will improve Outcome: Not Progressing Goal: Understanding of discharge needs will improve Outcome: Not Progressing   Problem: Health Behavior/Discharge Planning: Goal: Ability to identify changes in lifestyle to reduce recurrence of condition will improve Outcome: Not Progressing Goal: Identification of resources available to assist in meeting health care needs will improve Outcome: Not Progressing   Problem: Physical Regulation: Goal: Complications related to the disease process, condition or treatment will be avoided or minimized Outcome: Not  Progressing   Problem: Safety: Goal: Ability to remain free from injury will improve Outcome: Not Progressing   Problem: Education: Goal: Utilization of techniques to improve thought processes will improve Outcome: Not Progressing Goal: Knowledge of the prescribed therapeutic regimen will improve Outcome: Not Progressing   Problem: Activity: Goal: Interest or engagement in leisure activities will improve Outcome: Not Progressing Goal: Imbalance in normal sleep/wake cycle will improve Outcome: Not Progressing   Problem: Coping: Goal: Coping ability will improve Outcome: Not Progressing Goal: Will verbalize feelings Outcome: Not Progressing   Problem: Health Behavior/Discharge Planning: Goal: Ability to make decisions will improve Outcome: Not Progressing Goal: Compliance with therapeutic regimen will improve Outcome: Not Progressing   Problem: Role Relationship: Goal: Will demonstrate positive changes in social behaviors and relationships Outcome: Not Progressing   Problem: Safety: Goal: Ability to disclose and discuss suicidal ideas will improve Outcome: Not Progressing Goal: Ability to identify and utilize support systems that promote safety will improve Outcome: Not Progressing   Problem: Self-Concept: Goal: Will verbalize positive feelings about self Outcome: Not Progressing Goal: Level of anxiety will decrease Outcome: Not Progressing   Problem: Education: Goal: Ability to make informed decisions regarding treatment will improve Outcome: Not Progressing   Problem: Coping: Goal: Coping ability will improve Outcome: Not Progressing   Problem: Health Behavior/Discharge Planning: Goal: Identification of resources available to assist in meeting health care needs will improve Outcome: Not Progressing   Problem: Medication: Goal: Compliance with prescribed medication regimen will improve Outcome: Not Progressing   Problem: Self-Concept: Goal: Ability to  disclose and discuss suicidal ideas will improve Outcome: Not Progressing Goal: Will verbalize positive feelings about self Outcome: Not Progressing Note: Patient is on track. Patient will maintain adherence

## 2023-07-31 NOTE — BH IP Treatment Plan (Signed)
Interdisciplinary Treatment and Diagnostic Plan Update  07/31/2023 Time of Session: 9:43AM [name] MRN: [id number]  Principal Diagnosis: MDD (major depressive disorder), recurrent severe, without psychosis (HCC)  Secondary Diagnoses: Principal Problem:   MDD (major depressive disorder), recurrent severe, without psychosis (HCC) Active Problems:   Alcohol withdrawal (HCC)   Alcohol use disorder, severe, dependence (HCC)   Current Medications:  Current Facility-Administered Medications  Medication Dose Route Frequency Provider Last Rate Last Admin   acetaminophen (TYLENOL) tablet 500 mg  500 mg Oral Q6H PRN Onuoha, Chinwendu V, NP       haloperidol (HALDOL) tablet 5 mg  5 mg Oral TID PRN Onuoha, Chinwendu V, NP       And   diphenhydrAMINE (BENADRYL) capsule 50 mg  50 mg Oral TID PRN Onuoha, Chinwendu V, NP   50 mg at 07/30/23 2207   haloperidol lactate (HALDOL) injection 5 mg  5 mg Intramuscular TID PRN Onuoha, Chinwendu V, NP       And   diphenhydrAMINE (BENADRYL) injection 50 mg  50 mg Intramuscular TID PRN Onuoha, Chinwendu V, NP       And   LORazepam (ATIVAN) injection 2 mg  2 mg Intramuscular TID PRN Onuoha, Chinwendu V, NP       haloperidol lactate (HALDOL) injection 10 mg  10 mg Intramuscular TID PRN Onuoha, Chinwendu V, NP       And   diphenhydrAMINE (BENADRYL) injection 50 mg  50 mg Intramuscular TID PRN Onuoha, Chinwendu V, NP       And   LORazepam (ATIVAN) injection 2 mg  2 mg Intramuscular TID PRN Onuoha, Chinwendu V, NP       gabapentin (NEURONTIN) capsule 300 mg  300 mg Oral TID Charm Rings, NP   300 mg at 07/31/23 0847   hydrOXYzine (ATARAX) tablet 25 mg  25 mg Oral Q6H PRN Onuoha, Chinwendu V, NP       loperamide (IMODIUM) capsule 2-4 mg  2-4 mg Oral PRN Onuoha, Chinwendu V, NP       LORazepam (ATIVAN) tablet 1 mg  1 mg Oral Q6H PRN Onuoha, Chinwendu V, NP       LORazepam (ATIVAN) tablet 1 mg  1 mg Oral QID Onuoha, Chinwendu V, NP   1 mg at 07/31/23  0847   Followed by   LORazepam (ATIVAN) tablet 1 mg  1 mg Oral TID Onuoha, Chinwendu V, NP       Followed by   Melene Muller ON 08/01/2023] LORazepam (ATIVAN) tablet 1 mg  1 mg Oral BID Onuoha, Chinwendu V, NP       Followed by   Melene Muller ON 08/03/2023] LORazepam (ATIVAN) tablet 1 mg  1 mg Oral Daily Onuoha, Chinwendu V, NP       magnesium hydroxide (MILK OF MAGNESIA) suspension 15 mL  15 mL Oral Daily PRN Onuoha, Chinwendu V, NP       multivitamin with minerals tablet 1 tablet  1 tablet Oral Daily Onuoha, Chinwendu V, NP   1 tablet at 07/31/23 0847   ondansetron (ZOFRAN-ODT) disintegrating tablet 4 mg  4 mg Oral Q6H PRN Onuoha, Chinwendu V, NP       thiamine (VITAMIN B1) tablet 100 mg  100 mg Oral Daily Onuoha, Chinwendu V, NP   100 mg at 07/31/23 0847   PTA Medications: Medications Prior to Admission  Medication Sig Dispense Refill Last Dose   buPROPion (WELLBUTRIN XL) 150 MG 24 hr tablet Take 150 mg by mouth daily. In  the afternoon (Patient not taking: Reported on 07/29/2023)      buPROPion (WELLBUTRIN XL) 300 MG 24 hr tablet Take 300 mg by mouth in the morning. 300 mg in the morning 150 mg in the afternoon (Patient not taking: Reported on 07/29/2023)      busPIRone (BUSPAR) 10 MG tablet Take 10 mg by mouth 3 (three) times daily. (Patient not taking: Reported on 07/29/2023)      chlordiazePOXIDE (LIBRIUM) 25 MG capsule Take 2 capsules by mouth every 6 hours for 1 day, then 2 caps every 8 hours for 1 day, then 2 caps every 12 hours for 1 day, then 1 cap at night the final day (Patient not taking: Reported on 07/29/2023) 19 capsule 0    Cholecalciferol (VITAMIN D3) 50 MCG (2000 UT) CAPS Take by mouth daily. (Patient not taking: Reported on 07/29/2023)      Multiple Vitamins-Minerals (MULTIVITAMIN MEN PO) Take by mouth daily. (Patient not taking: Reported on 07/29/2023)       Patient Stressors: Loss of mother   Medication change or noncompliance   Substance abuse   Traumatic event    Patient Strengths:  Ability for insight  Average or above average intelligence  Capable of independent living  Arboriculturist fund of knowledge  Motivation for treatment/growth  Physical Health  Supportive family/friends  Work skills   Treatment Modalities: Medication Management, Group therapy, Case management,  1 to 1 session with clinician, Psychoeducation, Recreational therapy.   Physician Treatment Plan for Primary Diagnosis: MDD (major depressive disorder), recurrent severe, without psychosis (HCC) Long Term Goal(s):     Short Term Goals:    Medication Management: Evaluate patient's response, side effects, and tolerance of medication regimen.  Therapeutic Interventions: 1 to 1 sessions, Unit Group sessions and Medication administration.  Evaluation of Outcomes: Not Met  Physician Treatment Plan for Secondary Diagnosis: Principal Problem:   MDD (major depressive disorder), recurrent severe, without psychosis (HCC) Active Problems:   Alcohol withdrawal (HCC)   Alcohol use disorder, severe, dependence (HCC)  Long Term Goal(s):     Short Term Goals:       Medication Management: Evaluate patient's response, side effects, and tolerance of medication regimen.  Therapeutic Interventions: 1 to 1 sessions, Unit Group sessions and Medication administration.  Evaluation of Outcomes: Not Met   RN Treatment Plan for Primary Diagnosis: MDD (major depressive disorder), recurrent severe, without psychosis (HCC) Long Term Goal(s): Knowledge of disease and therapeutic regimen to maintain health will improve  Short Term Goals: Ability to verbalize frustration and anger appropriately will improve, Ability to demonstrate self-control, Ability to participate in decision making will improve, Ability to verbalize feelings will improve, Ability to disclose and discuss suicidal ideas, Ability to identify and develop effective coping behaviors will improve, and Compliance with  prescribed medications will improve  Medication Management: RN will administer medications as ordered by provider, will assess and evaluate patient's response and provide education to patient for prescribed medication. RN will report any adverse and/or side effects to prescribing provider.  Therapeutic Interventions: 1 on 1 counseling sessions, Psychoeducation, Medication administration, Evaluate responses to treatment, Monitor vital signs and CBGs as ordered, Perform/monitor CIWA, COWS, AIMS and Fall Risk screenings as ordered, Perform wound care treatments as ordered.  Evaluation of Outcomes: Not Met   LCSW Treatment Plan for Primary Diagnosis: MDD (major depressive disorder), recurrent severe, without psychosis (HCC) Long Term Goal(s): Safe transition to appropriate next level of care at discharge, Engage patient in  therapeutic group addressing interpersonal concerns.  Short Term Goals: Engage patient in aftercare planning with referrals and resources, Increase social support, Increase ability to appropriately verbalize feelings, Increase emotional regulation, Facilitate acceptance of mental health diagnosis and concerns, Facilitate patient progression through stages of change regarding substance use diagnoses and concerns, Identify triggers associated with mental health/substance abuse issues, and Increase skills for wellness and recovery  Therapeutic Interventions: Assess for all discharge needs, 1 to 1 time with Social worker, Explore available resources and support systems, Assess for adequacy in community support network, Educate family and significant other(s) on suicide prevention, Complete Psychosocial Assessment, Interpersonal group therapy.  Evaluation of Outcomes: Not Met   Progress in Treatment: Attending groups: Yes. and No. Participating in groups: Yes. Taking medication as prescribed: Yes. Toleration medication: Yes. Family/Significant other contact made: No, will contact:   Patient declined family contact.,  Patient understands diagnosis: Yes. Discussing patient identified problems/goals with staff: Yes. Medical problems stabilized or resolved: Yes. and No. Denies suicidal/homicidal ideation: Yes. Issues/concerns per patient self-inventory: Yes. Other: None  New problem(s) identified: No, Describe:  None  New Short Term/Long Term Goal(s):detox, elimination of symptoms of psychosis, medication management for mood stabilization; elimination of SI thoughts; development of comprehensive mental wellness/sobriety plan.    Patient Goals:  "Understanding more about depression and getting back on my medication."  Discharge Plan or Barriers: CSW to assist patient in the development of appropriate discharge plan.   Reason for Continuation of Hospitalization: Anxiety Depression Medication stabilization Suicidal ideation Withdrawal symptoms  Estimated Length of Stay: 1-7 days.   Last 3 [place] Suicide Severity Risk Score: Flowsheet Row Admission (Current) from 07/30/2023 in [facility] ED from 07/29/2023 in [facility] Department [facility] ED to Hosp-Admission (Discharged) from 03/01/2023 in [facility]  C-SSRS RISK CATEGORY Low Risk Low Risk Low Risk       Last PHQ 2/9 Scores:     No data to display          Scribe for Treatment Team: Lowry Ram, LCSW 07/31/2023 10:13 AM

## 2023-07-31 NOTE — Group Note (Signed)
Date:  07/31/2023 Time:  6:22 PM  Group Topic/Focus:  Crisis Planning:   The purpose of this group is to help patients create a crisis plan for use upon discharge or in the future, as needed.    Participation Level:  Did Not Attend  Participation Quality:  Appropriate  Affect:  Appropriate  Cognitive:  Appropriate  Insight: Appropriate  Engagement in Group:  Engaged  Modes of Intervention:  Activity  Additional Comments:    Mary Sella Dravon Nott 07/31/2023, 6:22 PM

## 2023-07-31 NOTE — Group Note (Signed)
Recreation Therapy Group Note   Group Topic:Health and Wellness  Group Date: 07/31/2023 Start Time: 1530 End Time: 1615 Facilitators: Rosina Lowenstein, LRT, CTRS Location:  Dayroom  Group Description: Seated Exercise. LRT discussed the mental and physical benefits of exercise. LRT and group discussed how physical activity can be used as a coping skill. Pt's and LRT followed along to an exercise video on the TV screen that provided a visual representation and audio description of every exercise performed. Pt's encouraged to listen to their bodies and stop at any time if they experience feelings of discomfort or pain. Pts were encouraged to drink water and stay hydrated.   Goal Area(s) Addressed: Patient will learn benefits of physical activity. Patient will identify exercise as a coping skill.  Patient will follow multistep directions. Patient will try a new leisure interest.    Affect/Mood: N/A   Participation Level: Did not attend    Clinical Observations/Individualized Feedback: Chad Walsh did not attend group.   Plan: Continue to engage patient in RT group sessions 2-3x/week.   Rosina Lowenstein, LRT, CTRS 07/31/2023 5:26 PM

## 2023-07-31 NOTE — Group Note (Signed)
Recreation Therapy Group Note   Group Topic:Leisure Education  Group Date: 07/31/2023 Start Time: 1000 End Time: 1100 Facilitators: Rosina Lowenstein, LRT, CTRS Location:  Craft Room  Group Description: Leisure. Patients were given the option to choose from singing karaoke, coloring mandalas, using oil pastels, journaling, or playing with play-doh. LRT and pts discussed the meaning of leisure, the importance of participating in leisure during their free time/when they're outside of the hospital, as well as how our leisure interests can also serve as coping skills.   Goal Area(s) Addressed:  Patient will identify a current leisure interest.  Patient will learn the definition of "leisure". Patient will practice making a positive decision. Patient will have the opportunity to try a new leisure activity. Patient will communicate with peers and LRT.    Affect/Mood: Appropriate   Participation Level: Active and Engaged   Participation Quality: Independent   Behavior: Appropriate, Calm, and Cooperative   Speech/Thought Process: Coherent   Insight: Good   Judgement: Good   Modes of Intervention: Education and Exploration   Patient Response to Interventions:  Attentive, Engaged, Interested , and Receptive   Education Outcome:  Acknowledges education   Clinical Observations/Individualized Feedback: Eithen was active in their participation of session activities and group discussion. Pt identified "play the guitar, martial arts, and write poetry" as things he does in his free time. Pt chose to journal while in group. Pt interacted well with LRT and peers duration of session.    Plan: Continue to engage patient in RT group sessions 2-3x/week.   Rosina Lowenstein, LRT, CTRS 07/31/2023 12:10 PM

## 2023-08-01 DIAGNOSIS — F332 Major depressive disorder, recurrent severe without psychotic features: Secondary | ICD-10-CM | POA: Diagnosis not present

## 2023-08-01 MED ORDER — ALUM & MAG HYDROXIDE-SIMETH 200-200-20 MG/5ML PO SUSP
30.0000 mL | ORAL | Status: DC | PRN
  Administered 2023-08-01: 30 mL via ORAL
  Filled 2023-08-01 (×2): qty 30

## 2023-08-01 MED ORDER — MAGNESIUM HYDROXIDE 400 MG/5ML PO SUSP: 15.0000 mL | Freq: Every day | ORAL | Status: DC | PRN

## 2023-08-01 NOTE — Plan of Care (Signed)
  Problem: Education: Goal: Mental status will improve Outcome: Progressing   

## 2023-08-01 NOTE — Plan of Care (Signed)
  Problem: Education: Goal: Knowledge of [place] [facility] Education information/materials will improve Outcome: Progressing Goal: Emotional status will improve Outcome: Progressing Goal: Mental status will improve Outcome: Progressing Goal: Verbalization of understanding the information provided will improve Outcome: Progressing   Problem: Activity: Goal: Interest or engagement in activities will improve Outcome: Progressing Goal: Sleeping patterns will improve Outcome: Progressing   Problem: Coping: Goal: Ability to verbalize frustrations and anger appropriately will improve Outcome: Progressing Goal: Ability to demonstrate self-control will improve Outcome: Progressing   Problem: Health Behavior/Discharge Planning: Goal: Identification of resources available to assist in meeting health care needs will improve Outcome: Progressing Goal: Compliance with treatment plan for underlying cause of condition will improve Outcome: Progressing   Problem: Physical Regulation: Goal: Ability to maintain clinical measurements within normal limits will improve Outcome: Progressing   Problem: Safety: Goal: Periods of time without injury will increase Outcome: Progressing   Problem: Education: Goal: Knowledge of disease or condition will improve Outcome: Progressing Goal: Understanding of discharge needs will improve Outcome: Progressing   Problem: Health Behavior/Discharge Planning: Goal: Ability to identify changes in lifestyle to reduce recurrence of condition will improve Outcome: Progressing Goal: Identification of resources available to assist in meeting health care needs will improve Outcome: Progressing   Problem: Physical Regulation: Goal: Complications related to the disease process, condition or treatment will be avoided or minimized Outcome: Progressing   Problem: Safety: Goal: Ability to remain free from injury will improve Outcome: Progressing   Problem:  Education: Goal: Utilization of techniques to improve thought processes will improve Outcome: Progressing Goal: Knowledge of the prescribed therapeutic regimen will improve Outcome: Progressing   Problem: Activity: Goal: Interest or engagement in leisure activities will improve Outcome: Progressing Goal: Imbalance in normal sleep/wake cycle will improve Outcome: Progressing   Problem: Coping: Goal: Coping ability will improve Outcome: Progressing Goal: Will verbalize feelings Outcome: Progressing   Problem: Health Behavior/Discharge Planning: Goal: Ability to make decisions will improve Outcome: Progressing Goal: Compliance with therapeutic regimen will improve Outcome: Progressing   Problem: Role Relationship: Goal: Will demonstrate positive changes in social behaviors and relationships Outcome: Progressing   Problem: Safety: Goal: Ability to disclose and discuss suicidal ideas will improve Outcome: Progressing Goal: Ability to identify and utilize support systems that promote safety will improve Outcome: Progressing   Problem: Self-Concept: Goal: Will verbalize positive feelings about self Outcome: Progressing Goal: Level of anxiety will decrease Outcome: Progressing   Problem: Education: Goal: Ability to make informed decisions regarding treatment will improve Outcome: Progressing   Problem: Coping: Goal: Coping ability will improve Outcome: Progressing   Problem: Health Behavior/Discharge Planning: Goal: Identification of resources available to assist in meeting health care needs will improve Outcome: Progressing   Problem: Medication: Goal: Compliance with prescribed medication regimen will improve Outcome: Progressing   Problem: Self-Concept: Goal: Ability to disclose and discuss suicidal ideas will improve Outcome: Progressing Goal: Will verbalize positive feelings about self Outcome: Progressing Note:

## 2023-08-01 NOTE — Group Note (Signed)
Date:  08/01/2023 Time:  11:19 AM  Group Topic/Focus:  Emotional Education:   The focus of this group is to discuss what feelings/emotions are, and how they are experienced.    Participation Level:  Active  Participation Quality:  Appropriate  Affect:  Appropriate  Cognitive:  Appropriate  Insight: Appropriate  Engagement in Group:  Engaged  Modes of Intervention:  Activity  Additional Comments:    Chad Walsh 08/01/2023, 11:19 AM

## 2023-08-01 NOTE — Progress Notes (Signed)
Psychiatric progress note adult  Patient Identification: Chad Walsh MRN:  [id number] Date of Evaluation:  08/01/2023 Chief Complaint:  MDD (major depressive disorder), recurrent severe, without psychosis (HCC) [F33.2] Principal Diagnosis: MDD (major depressive disorder), recurrent severe, without psychosis (HCC) Diagnosis:  Principal Problem:   MDD (major depressive disorder), recurrent severe, without psychosis (HCC) Active Problems:   Alcohol withdrawal (HCC)   Alcohol use disorder, severe, dependence (HCC)  Reason for admission   The patient is a 42 year old male who was admitted on an IVC through the emergency department after being evaluated for depression, suicidal ideations and alcohol withdrawal symptoms.  Chart review from last 24 hours   Staff reports that patient has been cooperative and had no behavioral problems overnight.  He continues to have mild to moderate withdrawal symptoms.  He is currently on the CIWA protocol. He received hydroxyzine as needed. Yesterday, the psychiatry team made the following recommendations:  Begin Wellbutrin 150 mg a day to be titrated up as tolerated Gabapentin 300 mg p.o. 3 times daily Tapering dose of lorazepam.  Information obtained during interview   The patient was seen and evaluated today and the chart was reviewed and the case was discussed with nursing staff and treatment team.  He is alert oriented and cooperative.  He continues to endorse fair to poor sleep.  His appetite is fair to good.  He also continues to endorse some withdrawal symptoms with anxiety and muscle aches and cramps.  He also has some flulike symptoms and feels wobbly.  He rates his depression at a 5/10 anxiety at a 6/10 and suicidal ideations as 0/10.  He continues to have daily ruminating thoughts about his mother who passed away on November 30, 20243 years ago.  She was only 63 and had lung cancer.  He has not had any grief counseling. Unfortunately he could not  continue with [facility] because he was unable to go to their SA-IOP program because of his job. Patient is contracting for safety. The plan is to continue titration of his medications. Closely monitor withdrawal symptoms. Associated Signs/Symptoms: Depression Symptoms:  depressed mood, psychomotor retardation, hopelessness, recurrent thoughts of death, suicidal thoughts with specific plan, anxiety, (Hypo) Manic Symptoms:  Distractibility, Impulsivity, Anxiety Symptoms:  Excessive Worry, Psychotic Symptoms:   None noted PTSD Symptoms: Negative Total Time spent with patient: 30 minutes  Past Psychiatric History: The patient gives a past psychiatric history of alcohol detox with history of DTs.  He has services through the [facility] for SA IOP until recently.  He was on Wellbutrin that was discontinued because he could not see a psychiatrist.  Is the patient at risk to self? Yes.    Has the patient been a risk to self in the past 6 months? Yes.    Has the patient been a risk to self within the distant past? Yes.    Is the patient a risk to others? No.  Has the patient been a risk to others in the past 6 months? No.  Has the patient been a risk to others within the distant past? No.   [place] Scale:  Flowsheet Row Admission (Current) from 07/30/2023 in [facility] INPATIENT BEHAVIORAL MEDICINE ED from 07/29/2023 in [facility] Emergency Department at [facility] ED to Hosp-Admission (Discharged) from 03/01/2023 in [facility] GENERAL SURGERY  C-SSRS RISK CATEGORY Low Risk Low Risk Low Risk        Prior Inpatient Therapy: Yes.   If yes, describe no record and unknown at  this time. Prior Outpatient Therapy: Yes.   If yes, describe prior treatment with her [facility].  Alcohol Screening: 1. How often do you have a drink containing alcohol?: 4 or more times a week 2. How many drinks containing alcohol do you have on a typical day when you are drinking?: 10 or more 3. How often do you  have six or more drinks on one occasion?: Daily or almost daily AUDIT-C Score: 12 4. How often during the last year have you found that you were not able to stop drinking once you had started?: Daily or almost daily 5. How often during the last year have you failed to do what was normally expected from you because of drinking?: Monthly 6. How often during the last year have you needed a first drink in the morning to get yourself going after a heavy drinking session?: Weekly 7. How often during the last year have you had a feeling of guilt of remorse after drinking?: Daily or almost daily 8. How often during the last year have you been unable to remember what happened the night before because you had been drinking?: Daily or almost daily 9. Have you or someone else been injured as a result of your drinking?: No 10. Has a relative or friend or a doctor or another health worker been concerned about your drinking or suggested you cut down?: Yes, but not in the last year Alcohol Use Disorder Identification Test Final Score (AUDIT): 31 Alcohol Brief Interventions/Follow-up: Alcohol education/Brief advice Substance Abuse History in the last 12 months:  Yes.   Consequences of Substance Abuse: Blackouts:  Gives history of DTs and blackouts. DT's: Gives history of DTs in the past. Withdrawal Symptoms:   Headaches Nausea Tremors Previous Psychotropic Medications: Yes  Psychological Evaluations: Yes  Past Medical History:  Past Medical History:  Diagnosis Date   Anxiety    Depression    ETOH abuse    GERD (gastroesophageal reflux disease)     Past Surgical History:  Procedure Laterality Date   ESOPHAGOGASTRODUODENOSCOPY (EGD) WITH PROPOFOL N/A 11/13/2021   Procedure: ESOPHAGOGASTRODUODENOSCOPY (EGD) WITH PROPOFOL;  Surgeon: Toney Reil, MD;  Location: [facility] ENDOSCOPY;  Service: Gastroenterology;  Laterality: N/A;   ORIF ANKLE FRACTURE Left 09/27/2020   Procedure: OPEN REDUCTION INTERNAL  FIXATION (ORIF) ANKLE FRACTURE, SYNDOSIS REPAIR;  Surgeon: Signa Kell, MD;  Location: [facility] ORS;  Service: Orthopedics;  Laterality: Left;   TONSILLECTOMY     WISDOM TOOTH EXTRACTION     Family History:  Family History  Problem Relation Age of Onset   Lung cancer Mother    Family Psychiatric  History: Unknown at this time. Tobacco Screening:  Social History   Tobacco Use  Smoking Status Former   Current packs/day: 0.00   Average packs/day: 0.5 packs/day for 18.0 years (9.0 ttl pk-yrs)   Types: Cigarettes   Start date: 03/25/2002   Quit date: 03/25/2020   Years since quitting: 3.3  Smokeless Tobacco Never    [facility] Tobacco Counseling     Are you interested in Tobacco Cessation Medications?  N/A, patient does not use tobacco products Counseled patient on smoking cessation:  N/A, patient does not use tobacco products Reason Tobacco Screening Not Completed: No value filed.       Social History:  Social History   Substance and Sexual Activity  Alcohol Use Yes   Alcohol/week: 28.0 standard drinks of alcohol   Types: 28 Standard drinks or equivalent per week   Comment: 09/25/20 - pt reports  no alcohol last 2 weeks     Social History   Substance and Sexual Activity  Drug Use Not Currently   Types: Marijuana   Comment: early 2021 last used    Additional Social History: Marital status: Single Are you sexually active?: No What is your sexual orientation?: "Straight" Has your sexual activity been affected by drugs, alcohol, medication, or emotional stress?: "Yes" Does patient have children?: No                         Allergies:  No Known Allergies Lab Results:  No results found for this or any previous visit (from the past 48 hour(s)).   Blood Alcohol level:  Lab Results  Component Value Date   ETH 356 (HH) 07/29/2023   ETH 40 (H) 03/01/2023    Metabolic Disorder Labs:  No results found for: "HGBA1C", "MPG" No results found for: "PROLACTIN" No results  found for: "CHOL", "TRIG", "HDL", "CHOLHDL", "VLDL", "LDLCALC"  Current Medications: Current Facility-Administered Medications  Medication Dose Route Frequency Provider Last Rate Last Admin   acetaminophen (TYLENOL) tablet 500 mg  500 mg Oral Q6H PRN Onuoha, Chinwendu V, NP       buPROPion (WELLBUTRIN XL) 24 hr tablet 150 mg  150 mg Oral Daily Rex Kras, MD   150 mg at 08/01/23 0755   cholecalciferol (VITAMIN D3) tablet 2,000 Units  2,000 Units Oral Daily Rex Kras, MD   2,000 Units at 08/01/23 0756   haloperidol (HALDOL) tablet 5 mg  5 mg Oral TID PRN Onuoha, Chinwendu V, NP       And   diphenhydrAMINE (BENADRYL) capsule 50 mg  50 mg Oral TID PRN Onuoha, Chinwendu V, NP   50 mg at 07/30/23 2207   haloperidol lactate (HALDOL) injection 5 mg  5 mg Intramuscular TID PRN Onuoha, Chinwendu V, NP       And   diphenhydrAMINE (BENADRYL) injection 50 mg  50 mg Intramuscular TID PRN Onuoha, Chinwendu V, NP       And   LORazepam (ATIVAN) injection 2 mg  2 mg Intramuscular TID PRN Onuoha, Chinwendu V, NP       haloperidol lactate (HALDOL) injection 10 mg  10 mg Intramuscular TID PRN Onuoha, Chinwendu V, NP       And   diphenhydrAMINE (BENADRYL) injection 50 mg  50 mg Intramuscular TID PRN Onuoha, Chinwendu V, NP       And   LORazepam (ATIVAN) injection 2 mg  2 mg Intramuscular TID PRN Onuoha, Chinwendu V, NP       gabapentin (NEURONTIN) capsule 300 mg  300 mg Oral TID Charm Rings, NP   300 mg at 08/01/23 0756   hydrOXYzine (ATARAX) tablet 25 mg  25 mg Oral Q6H PRN Onuoha, Chinwendu V, NP   25 mg at 08/01/23 1610   loperamide (IMODIUM) capsule 2-4 mg  2-4 mg Oral PRN Onuoha, Chinwendu V, NP       LORazepam (ATIVAN) tablet 1 mg  1 mg Oral Q6H PRN Onuoha, Chinwendu V, NP       LORazepam (ATIVAN) tablet 1 mg  1 mg Oral TID Onuoha, Chinwendu V, NP   1 mg at 08/01/23 0755   Followed by   LORazepam (ATIVAN) tablet 1 mg  1 mg Oral BID Onuoha, Chinwendu V, NP       Followed by   Melene Muller ON  08/03/2023] LORazepam (ATIVAN) tablet 1 mg  1 mg Oral Daily Onuoha,  Chinwendu V, NP       magnesium hydroxide (MILK OF MAGNESIA) suspension 15 mL  15 mL Oral Daily PRN Onuoha, Chinwendu V, NP       multivitamin with minerals tablet 1 tablet  1 tablet Oral Daily Onuoha, Chinwendu V, NP   1 tablet at 08/01/23 0755   ondansetron (ZOFRAN-ODT) disintegrating tablet 4 mg  4 mg Oral Q6H PRN Onuoha, Chinwendu V, NP       thiamine (VITAMIN B1) tablet 100 mg  100 mg Oral Daily Onuoha, Chinwendu V, NP   100 mg at 08/01/23 0756   PTA Medications: Medications Prior to Admission  Medication Sig Dispense Refill Last Dose   buPROPion (WELLBUTRIN XL) 150 MG 24 hr tablet Take 150 mg by mouth daily. In the afternoon (Patient not taking: Reported on 07/29/2023)      buPROPion (WELLBUTRIN XL) 300 MG 24 hr tablet Take 300 mg by mouth in the morning. 300 mg in the morning 150 mg in the afternoon (Patient not taking: Reported on 07/29/2023)      busPIRone (BUSPAR) 10 MG tablet Take 10 mg by mouth 3 (three) times daily. (Patient not taking: Reported on 07/29/2023)      chlordiazePOXIDE (LIBRIUM) 25 MG capsule Take 2 capsules by mouth every 6 hours for 1 day, then 2 caps every 8 hours for 1 day, then 2 caps every 12 hours for 1 day, then 1 cap at night the final day (Patient not taking: Reported on 07/29/2023) 19 capsule 0    Cholecalciferol (VITAMIN D3) 50 MCG (2000 UT) CAPS Take by mouth daily. (Patient not taking: Reported on 07/29/2023)      Multiple Vitamins-Minerals (MULTIVITAMIN MEN PO) Take by mouth daily. (Patient not taking: Reported on 07/29/2023)       Musculoskeletal: Strength & Muscle Tone: within normal limits Gait & Station: normal Patient leans: N/A            Psychiatric Specialty Exam:  Presentation  General Appearance:  Casual  Eye Contact: Fair  Speech: Clear and Coherent  Speech Volume: Decreased  Handedness: Right   Mood and Affect  Mood: Anxious;  Depressed  Affect: Restricted   Thought Process  Thought Processes: Coherent  Duration of Psychotic Symptoms:N/A Past Diagnosis of Schizophrenia or Psychoactive disorder: No data recorded Descriptions of Associations:Intact  Orientation:Full (Time, Place and Person)  Thought Content:Perseveration; Rumination  Hallucinations:Hallucinations: None  Ideas of Reference:None  Suicidal Thoughts:Suicidal Thoughts: Yes, Passive  Homicidal Thoughts:Homicidal Thoughts: No   Sensorium  Memory: Immediate Fair; Remote Fair; Recent Fair  Judgment: Fair  Insight: Fair   Art therapist  Concentration: Fair  Attention Span: Fair  Recall: [employer] of Knowledge: Fair  Language: Fair   Psychomotor Activity  Psychomotor Activity: Psychomotor Activity: Normal   Assets  Assets: Communication Skills; Desire for Improvement; Housing   Sleep  Sleep: Sleep: Fair    Physical Exam: Physical Exam Constitutional:      Appearance: Normal appearance. He is normal weight.  Neurological:     General: No focal deficit present.     Mental Status: He is alert and oriented to person, place, and time. Mental status is at baseline.    Review of Systems  Psychiatric/Behavioral:  Positive for depression, substance abuse and suicidal ideas. The patient is nervous/anxious.   All other systems reviewed and are negative.  Blood pressure (!) 136/97, pulse 96, temperature 98.1 F (36.7 C), resp. rate 18, height 5\' 8"  (1.727 m), weight 105.2 kg, SpO2 98%. Body  mass index is 35.28 kg/m.  Treatment Plan Summary: Daily contact with patient to assess and evaluate symptoms and progress in treatment, Medication management, and Plan please see below.  Observation Level/Precautions:  15 minute checks  Laboratory:  CBC Chemistry Profile  Psychotherapy: Individual and group therapy.  Medications:   Restart Wellbutrin XR 150 mg a day and gradually titrated up to 450 which was  his usual dose. Consider BuSpar 10 mg 3 times daily. Patient is on the detox protocol.  Consultations: As indicated  Discharge Concerns: Safety planning and outpatient follow-up  Estimated LOS: 5 to 7 days.  Other:     Physician Treatment Plan for Primary Diagnosis: MDD (major depressive disorder), recurrent severe, without psychosis (HCC) Long Term Goal(s): Improvement in symptoms so as ready for discharge  Short Term Goals: Ability to identify changes in lifestyle to reduce recurrence of condition will improve, Ability to verbalize feelings will improve, Ability to disclose and discuss suicidal ideas, and Compliance with prescribed medications will improve  Physician Treatment Plan for Secondary Diagnosis: Principal Problem:   MDD (major depressive disorder), recurrent severe, without psychosis (HCC) Active Problems:   Alcohol withdrawal (HCC)   Alcohol use disorder, severe, dependence (HCC)  Long Term Goal(s): Improvement in symptoms so as ready for discharge  Short Term Goals: Ability to verbalize feelings will improve, Ability to disclose and discuss suicidal ideas, Ability to demonstrate self-control will improve, Ability to maintain clinical measurements within normal limits will improve, and Ability to identify triggers associated with substance abuse/mental health issues will improve  I certify that inpatient services furnished can reasonably be expected to improve the patient's condition.    Rex Kras, MD 12/7/202411:21 AM Total Time Spent in Direct Patient Care:  I personally spent 30 minutes on the unit in direct patient care. The direct patient care time included face-to-face time with the patient, reviewing the patient's chart, communicating with other professionals, and coordinating care. Greater than 50% of this time was spent in counseling or coordinating care with the patient regarding goals of hospitalization, psycho-education, and discharge planning needs.    Keano Guggenheim ZOXW,[id number],EAV,[id number] Psychiatrist  Patient ID: Chad Walsh, male   DOB: 04-20-1981, 42 y.o.   MRN: [id number]

## 2023-08-01 NOTE — Progress Notes (Signed)
D- Patient alert and oriented. Affect/mood. Denies SI/ HI/ AVH. Patient denies pain. Patient endorses mild depression and anxiety. COWS = 3 but denies signs or symptoms of withdrawal. He says "I think I am ready to go home and get back to work". A- Scheduled medications administered to patient, per MD orders. Support and encouragement provided.  Routine safety checks conducted every 15 minutes without incident.  Patient informed to notify staff with problems or concerns and verbalizes understanding. R- No adverse drug reactions noted.  Patient compliant with medications and treatment plan. Patient receptive, calm and cooperative. He interacts well with others on the unit.  Patient contracts for safety and  remains safe on the unit at this time.

## 2023-08-02 DIAGNOSIS — F332 Major depressive disorder, recurrent severe without psychotic features: Secondary | ICD-10-CM | POA: Diagnosis not present

## 2023-08-02 MED ORDER — HYDROXYZINE HCL 50 MG PO TABS
50.0000 mg | ORAL_TABLET | Freq: Four times a day (QID) | ORAL | Status: DC | PRN
  Administered 2023-08-02: 50 mg via ORAL
  Filled 2023-08-02: qty 1

## 2023-08-02 MED ORDER — BUSPIRONE HCL 5 MG PO TABS
5.0000 mg | ORAL_TABLET | Freq: Three times a day (TID) | ORAL | Status: DC
  Administered 2023-08-02 – 2023-08-03 (×4): 5 mg via ORAL
  Filled 2023-08-02 (×4): qty 1

## 2023-08-02 NOTE — Plan of Care (Signed)
  Problem: Education: Goal: Knowledge of [facility] Education information/materials will improve Outcome: Progressing   Problem: Education: Goal: Mental status will improve Outcome: Progressing   

## 2023-08-02 NOTE — Progress Notes (Signed)
   08/01/23 2000  Psych Admission Type (Psych Patients Only)  Admission Status Involuntary  Psychosocial Assessment  Patient Complaints Anxiety  Eye Contact Fair  Facial Expression Anxious  Affect Anxious  Speech Logical/coherent  Interaction Assertive  Motor Activity Slow  Appearance/Hygiene Unremarkable  Behavior Characteristics Cooperative;Appropriate to situation  Mood Anxious  Thought Process  Coherency WDL  Content WDL  Delusions None reported or observed  Perception WDL  Hallucination None reported or observed  Judgment Impaired  Confusion None  Danger to Self  Current suicidal ideation? Denies  Agreement Not to Harm Self Yes  Description of Agreement verbal  Danger to Others  Danger to Others None reported or observed   Patient alert and oriented x 4, affect is blunted he denies SI/HI/AVH interacting appropriately with peers and staff.

## 2023-08-02 NOTE — Progress Notes (Signed)
   08/02/23 1000  Psych Admission Type (Psych Patients Only)  Admission Status Involuntary  Psychosocial Assessment  Patient Complaints Anxiety  Eye Contact Fair  Facial Expression Anxious  Affect Anxious  Speech Logical/coherent  Interaction Assertive  Motor Activity Slow  Appearance/Hygiene Unremarkable  Behavior Characteristics Cooperative;Appropriate to situation  Mood Anxious;Pleasant  Thought Process  Coherency WDL  Content WDL  Delusions None reported or observed  Perception WDL  Hallucination None reported or observed  Judgment Impaired  Confusion None  Danger to Self  Current suicidal ideation? Denies  Agreement Not to Harm Self Yes  Description of Agreement verbal  Danger to Others  Danger to Others None reported or observed

## 2023-08-02 NOTE — Group Note (Signed)
Date:  08/02/2023 Time:  8:58 PM  Group Topic/Focus:  Wrap-Up Group:   The focus of this group is to help patients review their daily goal of treatment and discuss progress on daily workbooks.    Participation Level:  Active  Participation Quality:  Appropriate, Attentive, Sharing, and Supportive  Affect:  Appropriate  Cognitive:  Appropriate  Insight: Appropriate and Good  Engagement in Group:  Engaged and Supportive  Modes of Intervention:  Discussion and Support  Additional Comments:     Belva Crome 08/02/2023, 8:58 PM

## 2023-08-02 NOTE — Progress Notes (Addendum)
Psychiatric progress note adult  Patient Identification: Chad Walsh MRN:  [id number] Date of Evaluation:  08/02/2023 Chief Complaint:  MDD (major depressive disorder), recurrent severe, without psychosis (HCC) [F33.2] Principal Diagnosis: MDD (major depressive disorder), recurrent severe, without psychosis (HCC) Diagnosis:  Principal Problem:   MDD (major depressive disorder), recurrent severe, without psychosis (HCC) Active Problems:   Alcohol withdrawal (HCC)   Alcohol use disorder, severe, dependence (HCC)  Reason for admission   The patient is a 41 year old male who was admitted on an IVC through the emergency department after being evaluated for depression, suicidal ideations and alcohol withdrawal symptoms.  Chart review from last 24 hours   Staff reports that the patient has been alert cooperative but anxious and depressed.  He has been compliant with treatment.  He has been interacting appropriately with peers.  Last night he received as needed hydroxyzine, lorazepam and Maalox.  Yesterday, the psychiatry team made the following recommendations:  Continue Wellbutrin 150 mg a day to be titrated up as tolerated Gabapentin 300 mg p.o. 3 times daily Tapering dose of lorazepam.  Information obtained during interview   The patient was seen and evaluated today.  Patient continues to be progressing well.  He reports that he sleeps much better and that he is not having as much withdrawals.  He is currently on the Ativan taper.  His depression is doing fairly well and he denies any active SI/HI/AVH.  He is contracting for safety.  He still grieves over the loss of his mother and has never had grief counseling.  He states that he cannot go to the [facility] because of his job but is willing to go for outpatient counseling.  The plan is: To increase Wellbutrin to 300 mg prior to discharge Restart BuSpar 5mg  3 times daily and after Ativan is discontinued increased to 10 mg 3 times  daily Closely monitor withdrawal symptoms. Discharge planning.   Associated Signs/Symptoms: Depression Symptoms:  depressed mood, psychomotor retardation, hopelessness, recurrent thoughts of death, suicidal thoughts with specific plan, anxiety, (Hypo) Manic Symptoms:  Distractibility, Impulsivity, Anxiety Symptoms:  Excessive Worry, Psychotic Symptoms:   None noted PTSD Symptoms: Negative Total Time spent with patient: 30 minutes  Past Psychiatric History: The patient gives a past psychiatric history of alcohol detox with history of DTs.  He has services through the [facility] for SA IOP until recently.  He was on Wellbutrin that was discontinued because he could not see a psychiatrist.  Is the patient at risk to self? Yes.    Has the patient been a risk to self in the past 6 months? Yes.    Has the patient been a risk to self within the distant past? Yes.    Is the patient a risk to others? No.  Has the patient been a risk to others in the past 6 months? No.  Has the patient been a risk to others within the distant past? No.   [place] Scale:  Flowsheet Row Admission (Current) from 07/30/2023 in [facility] INPATIENT BEHAVIORAL MEDICINE ED from 07/29/2023 in [facility] Emergency Department at [facility] ED to Hosp-Admission (Discharged) from 03/01/2023 in [facility]ERY  C-SSRS RISK CATEGORY Low Risk Low Risk Low Risk        Prior Inpatient Therapy: Yes.   If yes, describe no record and unknown at this time. Prior Outpatient Therapy: Yes.   If yes, describe prior treatment with her [facility].  Alcohol Screening: 1. How often do you have a  drink containing alcohol?: 4 or more times a week 2. How many drinks containing alcohol do you have on a typical day when you are drinking?: 10 or more 3. How often do you have six or more drinks on one occasion?: Daily or almost daily AUDIT-C Score: 12 4. How often during the last year have you found that you were not  able to stop drinking once you had started?: Daily or almost daily 5. How often during the last year have you failed to do what was normally expected from you because of drinking?: Monthly 6. How often during the last year have you needed a first drink in the morning to get yourself going after a heavy drinking session?: Weekly 7. How often during the last year have you had a feeling of guilt of remorse after drinking?: Daily or almost daily 8. How often during the last year have you been unable to remember what happened the night before because you had been drinking?: Daily or almost daily 9. Have you or someone else been injured as a result of your drinking?: No 10. Has a relative or friend or a doctor or another health worker been concerned about your drinking or suggested you cut down?: Yes, but not in the last year Alcohol Use Disorder Identification Test Final Score (AUDIT): 31 Alcohol Brief Interventions/Follow-up: Alcohol education/Brief advice Substance Abuse History in the last 12 months:  Yes.   Consequences of Substance Abuse: Blackouts:  Gives history of DTs and blackouts. DT's: Gives history of DTs in the past. Withdrawal Symptoms:   Headaches Nausea Tremors Previous Psychotropic Medications: Yes  Psychological Evaluations: Yes  Past Medical History:  Past Medical History:  Diagnosis Date   Anxiety    Depression    ETOH abuse    GERD (gastroesophageal reflux disease)     Past Surgical History:  Procedure Laterality Date   ESOPHAGOGASTRODUODENOSCOPY (EGD) WITH PROPOFOL N/A 11/13/2021   Procedure: ESOPHAGOGASTRODUODENOSCOPY (EGD) WITH PROPOFOL;  Surgeon: Toney Reil, MD;  Location: [facility] ENDOSCOPY;  Service: Gastroenterology;  Laterality: N/A;   ORIF ANKLE FRACTURE Left 09/27/2020   Procedure: OPEN REDUCTION INTERNAL FIXATION (ORIF) ANKLE FRACTURE, SYNDOSIS REPAIR;  Surgeon: Signa Kell, MD;  Location: [facility] ORS;  Service: Orthopedics;  Laterality: Left;    TONSILLECTOMY     WISDOM TOOTH EXTRACTION     Family History:  Family History  Problem Relation Age of Onset   Lung cancer Mother    Family Psychiatric  History: Unknown at this time. Tobacco Screening:  Social History   Tobacco Use  Smoking Status Former   Current packs/day: 0.00   Average packs/day: 0.5 packs/day for 18.0 years (9.0 ttl pk-yrs)   Types: Cigarettes   Start date: 03/25/2002   Quit date: 03/25/2020   Years since quitting: 3.3  Smokeless Tobacco Never    [facility] Tobacco Counseling     Are you interested in Tobacco Cessation Medications?  N/A, patient does not use tobacco products Counseled patient on smoking cessation:  N/A, patient does not use tobacco products Reason Tobacco Screening Not Completed: No value filed.       Social History:  Social History   Substance and Sexual Activity  Alcohol Use Yes   Alcohol/week: 28.0 standard drinks of alcohol   Types: 28 Standard drinks or equivalent per week   Comment: 09/25/20 - pt reports no alcohol last 2 weeks     Social History   Substance and Sexual Activity  Drug Use Not Currently   Types: Marijuana  Comment: early 2021 last used    Additional Social History: Marital status: Single Are you sexually active?: No What is your sexual orientation?: "Straight" Has your sexual activity been affected by drugs, alcohol, medication, or emotional stress?: "Yes" Does patient have children?: No                         Allergies:  No Known Allergies Lab Results:  No results found for this or any previous visit (from the past 48 hour(s)).   Blood Alcohol level:  Lab Results  Component Value Date   ETH 356 (HH) 07/29/2023   ETH 40 (H) 03/01/2023    Metabolic Disorder Labs:  No results found for: "HGBA1C", "MPG" No results found for: "PROLACTIN" No results found for: "CHOL", "TRIG", "HDL", "CHOLHDL", "VLDL", "LDLCALC"  Current Medications: Current Facility-Administered Medications  Medication Dose  Route Frequency Provider Last Rate Last Admin   acetaminophen (TYLENOL) tablet 500 mg  500 mg Oral Q6H PRN Onuoha, Chinwendu V, NP       alum & mag hydroxide-simeth (MAALOX/MYLANTA) 200-200-20 MG/5ML suspension 30 mL  30 mL Oral Q4H PRN Sarina Ill, DO   30 mL at 08/01/23 1927   buPROPion (WELLBUTRIN XL) 24 hr tablet 150 mg  150 mg Oral Daily Rex Kras, MD   150 mg at 08/02/23 0732   cholecalciferol (VITAMIN D3) tablet 2,000 Units  2,000 Units Oral Daily Rex Kras, MD   2,000 Units at 08/02/23 0732   haloperidol (HALDOL) tablet 5 mg  5 mg Oral TID PRN Onuoha, Chinwendu V, NP       And   diphenhydrAMINE (BENADRYL) capsule 50 mg  50 mg Oral TID PRN Onuoha, Chinwendu V, NP   50 mg at 07/30/23 2207   haloperidol lactate (HALDOL) injection 5 mg  5 mg Intramuscular TID PRN Onuoha, Chinwendu V, NP       And   diphenhydrAMINE (BENADRYL) injection 50 mg  50 mg Intramuscular TID PRN Onuoha, Chinwendu V, NP       And   LORazepam (ATIVAN) injection 2 mg  2 mg Intramuscular TID PRN Onuoha, Chinwendu V, NP       haloperidol lactate (HALDOL) injection 10 mg  10 mg Intramuscular TID PRN Onuoha, Chinwendu V, NP       And   diphenhydrAMINE (BENADRYL) injection 50 mg  50 mg Intramuscular TID PRN Onuoha, Chinwendu V, NP       And   LORazepam (ATIVAN) injection 2 mg  2 mg Intramuscular TID PRN Onuoha, Chinwendu V, NP       gabapentin (NEURONTIN) capsule 300 mg  300 mg Oral TID Charm Rings, NP   300 mg at 08/02/23 0732   hydrOXYzine (ATARAX) tablet 25 mg  25 mg Oral Q6H PRN Onuoha, Chinwendu V, NP   25 mg at 08/01/23 9604   loperamide (IMODIUM) capsule 2-4 mg  2-4 mg Oral PRN Onuoha, Chinwendu V, NP       LORazepam (ATIVAN) tablet 1 mg  1 mg Oral Q6H PRN Onuoha, Chinwendu V, NP   1 mg at 08/01/23 2130   [START ON 08/03/2023] LORazepam (ATIVAN) tablet 1 mg  1 mg Oral Daily Onuoha, Chinwendu V, NP       magnesium hydroxide (MILK OF MAGNESIA) suspension 15 mL  15 mL Oral Daily PRN Sarina Ill, DO       multivitamin with minerals tablet 1 tablet  1 tablet Oral Daily Onuoha, Chinwendu V,  NP   1 tablet at 08/02/23 0732   ondansetron (ZOFRAN-ODT) disintegrating tablet 4 mg  4 mg Oral Q6H PRN Onuoha, Chinwendu V, NP       thiamine (VITAMIN B1) tablet 100 mg  100 mg Oral Daily Onuoha, Chinwendu V, NP   100 mg at 08/02/23 0858   PTA Medications: Medications Prior to Admission  Medication Sig Dispense Refill Last Dose   buPROPion (WELLBUTRIN XL) 150 MG 24 hr tablet Take 150 mg by mouth daily. In the afternoon (Patient not taking: Reported on 07/29/2023)      buPROPion (WELLBUTRIN XL) 300 MG 24 hr tablet Take 300 mg by mouth in the morning. 300 mg in the morning 150 mg in the afternoon (Patient not taking: Reported on 07/29/2023)      busPIRone (BUSPAR) 10 MG tablet Take 10 mg by mouth 3 (three) times daily. (Patient not taking: Reported on 07/29/2023)      chlordiazePOXIDE (LIBRIUM) 25 MG capsule Take 2 capsules by mouth every 6 hours for 1 day, then 2 caps every 8 hours for 1 day, then 2 caps every 12 hours for 1 day, then 1 cap at night the final day (Patient not taking: Reported on 07/29/2023) 19 capsule 0    Cholecalciferol (VITAMIN D3) 50 MCG (2000 UT) CAPS Take by mouth daily. (Patient not taking: Reported on 07/29/2023)      Multiple Vitamins-Minerals (MULTIVITAMIN MEN PO) Take by mouth daily. (Patient not taking: Reported on 07/29/2023)       Musculoskeletal: Strength & Muscle Tone: within normal limits Gait & Station: normal Patient leans: N/A            Psychiatric Specialty Exam:  Presentation  General Appearance:  Casual  Eye Contact: Fair  Speech: Clear and Coherent  Speech Volume: Decreased  Handedness: Right   Mood and Affect  Mood: Dysphoric  Affect: Restricted   Thought Process  Thought Processes: Coherent  Duration of Psychotic Symptoms:N/A Past Diagnosis of Schizophrenia or Psychoactive disorder: No data  recorded Descriptions of Associations:Intact  Orientation:Full (Time, Place and Person)  Thought Content:Rumination  Hallucinations:Hallucinations: None  Ideas of Reference:None  Suicidal Thoughts:Suicidal Thoughts: No  Homicidal Thoughts:Homicidal Thoughts: No   Sensorium  Memory: Immediate Fair; Remote Fair; Recent Fair  Judgment: Fair  Insight: Fair   Art therapist  Concentration: Fair  Attention Span: Fair  Recall: [employer] of Knowledge: Fair  Language: Fair   Psychomotor Activity  Psychomotor Activity: Psychomotor Activity: Normal   Assets  Assets: Communication Skills; Desire for Improvement; Housing   Sleep  Sleep: Sleep: Good    Physical Exam: Physical Exam Constitutional:      Appearance: Normal appearance. He is normal weight.  Neurological:     General: No focal deficit present.     Mental Status: He is alert and oriented to person, place, and time. Mental status is at baseline.    Review of Systems  Psychiatric/Behavioral:  Positive for depression, substance abuse and suicidal ideas. The patient is nervous/anxious.   All other systems reviewed and are negative.  Blood pressure 134/89, pulse 92, temperature (!) 97.2 F (36.2 C), resp. rate 19, height 5\' 8"  (1.727 m), weight 105.2 kg, SpO2 100%. Body mass index is 35.28 kg/m.  Treatment Plan Summary: Daily contact with patient to assess and evaluate symptoms and progress in treatment, Medication management, and Plan please see below.  Observation Level/Precautions:  15 minute checks  Laboratory:  CBC Chemistry Profile  Psychotherapy: Individual and group therapy.  Medications:  Restart Wellbutrin XR 150 mg a day and gradually titrated up to 300  Consider BuSpar 10 mg 3 times daily. Patient is on the detox protocol.  Consultations: As indicated  Discharge Concerns: Safety planning and outpatient follow-up  Estimated LOS: Possible discharge by Wednesday.   Other:     Physician Treatment Plan for Primary Diagnosis: MDD (major depressive disorder), recurrent severe, without psychosis (HCC) Long Term Goal(s): Improvement in symptoms so as ready for discharge  Short Term Goals: Ability to identify changes in lifestyle to reduce recurrence of condition will improve, Ability to verbalize feelings will improve, Ability to disclose and discuss suicidal ideas, and Compliance with prescribed medications will improve  Physician Treatment Plan for Secondary Diagnosis: Principal Problem:   MDD (major depressive disorder), recurrent severe, without psychosis (HCC) Active Problems:   Alcohol withdrawal (HCC)   Alcohol use disorder, severe, dependence (HCC)  Long Term Goal(s): Improvement in symptoms so as ready for discharge  Short Term Goals: Ability to verbalize feelings will improve, Ability to disclose and discuss suicidal ideas, Ability to demonstrate self-control will improve, Ability to maintain clinical measurements within normal limits will improve, and Ability to identify triggers associated with substance abuse/mental health issues will improve  I certify that inpatient services furnished can reasonably be expected to improve the patient's condition.    Rex Kras, MD 12/8/202410:35 AM Total Time Spent in Direct Patient Care:  I personally spent 30 minutes on the unit in direct patient care. The direct patient care time included face-to-face time with the patient, reviewing the patient's chart, communicating with other professionals, and coordinating care. Greater than 50% of this time was spent in counseling or coordinating care with the patient regarding goals of hospitalization, psycho-education, and discharge planning needs.   Diantha Paxson QMVH,QI,ONG,EXBMWU Psychiatrist  Patient ID: Chad Walsh, male   DOB: [birth year], 42 y.o.   MRN: [id number] Patient ID: Chad Walsh, male   DOB: 1981/08/04, 42 y.o.   MRN: [id number]

## 2023-08-02 NOTE — Plan of Care (Signed)
  Problem: Education: Goal: Knowledge of [place] [facility] Education information/materials will improve Outcome: Progressing Goal: Emotional status will improve Outcome: Progressing Goal: Mental status will improve Outcome: Progressing Goal: Verbalization of understanding the information provided will improve Outcome: Progressing   Problem: Activity: Goal: Interest or engagement in activities will improve Outcome: Progressing Goal: Sleeping patterns will improve Outcome: Progressing   Problem: Coping: Goal: Ability to verbalize frustrations and anger appropriately will improve Outcome: Progressing Goal: Ability to demonstrate self-control will improve Outcome: Progressing   Problem: Health Behavior/Discharge Planning: Goal: Identification of resources available to assist in meeting health care needs will improve Outcome: Progressing Goal: Compliance with treatment plan for underlying cause of condition will improve Outcome: Progressing   Problem: Physical Regulation: Goal: Ability to maintain clinical measurements within normal limits will improve Outcome: Progressing   Problem: Safety: Goal: Periods of time without injury will increase Outcome: Progressing   Problem: Education: Goal: Knowledge of disease or condition will improve Outcome: Progressing Goal: Understanding of discharge needs will improve Outcome: Progressing   Problem: Health Behavior/Discharge Planning: Goal: Ability to identify changes in lifestyle to reduce recurrence of condition will improve Outcome: Progressing Goal: Identification of resources available to assist in meeting health care needs will improve Outcome: Progressing   Problem: Physical Regulation: Goal: Complications related to the disease process, condition or treatment will be avoided or minimized Outcome: Progressing   Problem: Safety: Goal: Ability to remain free from injury will improve Outcome: Progressing   Problem:  Education: Goal: Utilization of techniques to improve thought processes will improve Outcome: Progressing Goal: Knowledge of the prescribed therapeutic regimen will improve Outcome: Progressing   Problem: Activity: Goal: Interest or engagement in leisure activities will improve Outcome: Progressing Goal: Imbalance in normal sleep/wake cycle will improve Outcome: Progressing   Problem: Coping: Goal: Coping ability will improve Outcome: Progressing Goal: Will verbalize feelings Outcome: Progressing   Problem: Health Behavior/Discharge Planning: Goal: Ability to make decisions will improve Outcome: Progressing Goal: Compliance with therapeutic regimen will improve Outcome: Progressing   Problem: Role Relationship: Goal: Will demonstrate positive changes in social behaviors and relationships Outcome: Progressing   Problem: Safety: Goal: Ability to disclose and discuss suicidal ideas will improve Outcome: Progressing Goal: Ability to identify and utilize support systems that promote safety will improve Outcome: Progressing   Problem: Self-Concept: Goal: Will verbalize positive feelings about self Outcome: Progressing Goal: Level of anxiety will decrease Outcome: Progressing   Problem: Education: Goal: Ability to make informed decisions regarding treatment will improve Outcome: Progressing   Problem: Coping: Goal: Coping ability will improve Outcome: Progressing   Problem: Health Behavior/Discharge Planning: Goal: Identification of resources available to assist in meeting health care needs will improve Outcome: Progressing   Problem: Medication: Goal: Compliance with prescribed medication regimen will improve Outcome: Progressing   Problem: Self-Concept: Goal: Ability to disclose and discuss suicidal ideas will improve Outcome: Progressing Goal: Will verbalize positive feelings about self Outcome: Progressing Note:

## 2023-08-02 NOTE — Group Note (Signed)
Date:  08/02/2023 Time:  11:23 AM  Group Topic/Focus:   Goals Group:   The focus of this group is to help patients establish daily goals to achieve during treatment and discuss how the patient can incorporate goal setting into their daily lives to aide in recovery. Managing Feelings:   The focus of this group is to identify what feelings patients have difficulty handling and develop a plan to handle them in a healthier way upon discharge.  Participation Level:  Active  Participation Quality:  Appropriate and Attentive  Affect:  Appropriate  Cognitive:  Alert and Appropriate  Insight: Appropriate  Engagement in Group:  Engaged  Modes of Intervention:  Activity, Discussion, and Education  Additional Comments:    Machaela Caterino A Wilson Sample 08/02/2023, 11:23 AM

## 2023-08-02 NOTE — Group Note (Signed)
Date:  08/02/2023 Time:  6:55 PM  Group Topic/Focus:   Structured Activity Group  Participation Level:  Active  Participation Quality:  Appropriate  Affect:  Appropriate  Cognitive:  Alert and Appropriate  Insight: Appropriate  Engagement in Group:  Engaged  Modes of Intervention:  Activity  Additional Comments:    Imberly Troxler A Oshua Mcconaha 08/02/2023, 6:55 PM

## 2023-08-03 DIAGNOSIS — F332 Major depressive disorder, recurrent severe without psychotic features: Secondary | ICD-10-CM | POA: Diagnosis not present

## 2023-08-03 MED ORDER — BUPROPION HCL ER (XL) 150 MG PO TB24: 150.0000 mg | ORAL_TABLET | Freq: Every day | ORAL | Status: DC

## 2023-08-03 MED ORDER — TRAZODONE HCL 50 MG PO TABS
50.0000 mg | ORAL_TABLET | Freq: Every day | ORAL | Status: DC
  Administered 2023-08-03: 50 mg via ORAL
  Filled 2023-08-03: qty 1

## 2023-08-03 MED ORDER — BUSPIRONE HCL 5 MG PO TABS: 5.0000 mg | ORAL_TABLET | Freq: Three times a day (TID) | ORAL | 1 refills | Status: AC

## 2023-08-03 MED ORDER — HYDROXYZINE HCL 50 MG PO TABS: 50.0000 mg | ORAL_TABLET | Freq: Three times a day (TID) | ORAL | 0 refills | Status: AC | PRN

## 2023-08-03 MED ORDER — GABAPENTIN 300 MG PO CAPS: 300.0000 mg | ORAL_CAPSULE | Freq: Three times a day (TID) | ORAL | 1 refills | Status: DC

## 2023-08-03 MED ORDER — TRAZODONE HCL 50 MG PO TABS: 50.0000 mg | ORAL_TABLET | Freq: Every day | ORAL | 1 refills | Status: DC

## 2023-08-03 NOTE — Discharge Summary (Signed)
L; Physician Discharge Summary Note  Patient:  Chad Walsh is an 42 y.o., male MRN:  [id number] DOB:  [birth year] Patient phone:  [phone] (home)  Patient address:   [address] [place] [address],  Total Time spent with patient: 1 hour  Date of Admission:  07/30/2023 Date of Discharge: 08/03/2023  Reason for Admission:  42 year old Caucasian male as admitted for severe alcohol use disorder with active withdrawal symptoms, worsening depression, and suicidal ideation (SI). The patient relapsed on alcohol a week ago, consuming approximately 1.5 fifths of liquor daily after several months of sobriety. He presented with withdrawal symptoms, including anxiety and tremors, and endorsed active suicidal thoughts with a vague plan to harm himself. The patient reported chronic depression exacerbated by grief related to the anniversary of his mother's death and a history of a previous suicide attempt.The admission was necessary to ensure stabilization during alcohol detoxification, manage withdrawal symptoms, address acute depressive symptoms, and provide a safe environment to prevent potential self-harm.  Principal Problem: MDD (major depressive disorder), recurrent severe, without psychosis (HCC) Discharge Diagnoses: Principal Problem:   MDD (major depressive disorder), recurrent severe, without psychosis (HCC)   Past Psychiatric History: Anxiety Depression  Past Medical History:  Past Medical History:  Diagnosis Date   Anxiety    Depression    ETOH abuse    GERD (gastroesophageal reflux disease)     Past Surgical History:  Procedure Laterality Date   ESOPHAGOGASTRODUODENOSCOPY (EGD) WITH PROPOFOL N/A 11/13/2021   Procedure: ESOPHAGOGASTRODUODENOSCOPY (EGD) WITH PROPOFOL;  Surgeon: Toney Reil, MD;  Location: [facility] ENDOSCOPY;  Service: Gastroenterology;  Laterality: N/A;   ORIF ANKLE FRACTURE Left 09/27/2020   Procedure: OPEN REDUCTION INTERNAL FIXATION  (ORIF) ANKLE FRACTURE, SYNDOSIS REPAIR;  Surgeon: Signa Kell, MD;  Location: [facility] ORS;  Service: Orthopedics;  Laterality: Left;   TONSILLECTOMY     WISDOM TOOTH EXTRACTION     Family History:  Family History  Problem Relation Age of Onset   Lung cancer Mother    Family Psychiatric  History: none reported Social History:  Social History   Substance and Sexual Activity  Alcohol Use Yes   Alcohol/week: 28.0 standard drinks of alcohol   Types: 28 Standard drinks or equivalent per week   Comment: 09/25/20 - pt reports no alcohol last 2 weeks     Social History   Substance and Sexual Activity  Drug Use Not Currently   Types: Marijuana   Comment: early 2021 last used    Social History   Socioeconomic History   Marital status: Single    Spouse name: Not on file   Number of children: Not on file   Years of education: Not on file   Highest education level: Not on file  Occupational History   Not on file  Tobacco Use   Smoking status: Former    Current packs/day: 0.00    Average packs/day: 0.5 packs/day for 18.0 years (9.0 ttl pk-yrs)    Types: Cigarettes    Start date: 03/25/2002    Quit date: 03/25/2020    Years since quitting: 3.3   Smokeless tobacco: Never  Vaping Use   Vaping status: Never Used  Substance and Sexual Activity   Alcohol use: Yes    Alcohol/week: 28.0 standard drinks of alcohol    Types: 28 Standard drinks or equivalent per week    Comment: 09/25/20 - pt reports no alcohol last 2 weeks   Drug use: Not Currently    Types: Marijuana  Comment: early 2021 last used   Sexual activity: Not on file  Other Topics Concern   Not on file  Social History Narrative   Not on file   Social Determinants of Health   Financial Resource Strain: Not on file  Food Insecurity: No Food Insecurity (07/30/2023)   Hunger Vital Sign    Worried About Running Out of Food in the Last Year: Never true    Ran Out of Food in the Last Year: Never true  Transportation Needs: No  Transportation Needs (07/30/2023)   PRAPARE - Administrator, Civil Service (Medical): No    Lack of Transportation (Non-Medical): No  Physical Activity: Not on file  Stress: Not on file  Social Connections: Not on file    Hospital Course:  The patient, a 42 year old Caucasian male, was admitted to the behavioral health unit following evaluation in the emergency department for severe alcohol use disorder, active withdrawal symptoms, and suicidal ideation. Upon admission, the patient reported chronic depression exacerbated by grief related to his mother's death anniversary and an acute relapse on alcohol after months of sobriety. He endorsed ongoing cravings, lethargy, and hopelessness. Blood alcohol level was 356 mg/dL upon arrivalThe patient was initiated on a CIWA protocol using chlordiazepoxide (Librium) to manage withdrawal symptoms. Dosages were tapered over the course of hospitalization based on symptom improvement. The patient tolerated the protocol well without complications such as hallucinations, seizures, or delirium tremens.Marland KitchenPsychiatric Stabilization:The patient engaged in individual and group therapy sessions addressing coping strategies for depression, grief, and alcohol use disorder. He initially presented as withdrawn and hopeless but gradually became more interactive and motivated for recovery. No evidence of psychosis or mania was observed during the hospital stay.Upon admission, the patient expressed suicidal ideation with a vague plan to harm himself. Over the course of treatment, he contracted for safety and reported no active suicidal or homicidal thoughts at the time of discharge. The patient was provided with crisis resources and a plan for continued care.The patient was stable at discharge with mild residual withdrawal symptoms and ongoing depressive symptoms. He was referred to [facility] for outpatient follow-up (appointment scheduled for August 07, 2023, at 5:00 PM). Recommendations included restarting therapy and exploring structured rehabilitation programs for long-term sobriety support. The patient expressed willingness to adhere to the discharge plan and medication regimen.The patient demonstrated significant progress during hospitalization, achieving stabilization of withdrawal symptoms and showing initial improvement in mood and motivation. He will require close outpatient follow-up to maintain recovery and address ongoing mental health needs.  Physical Findings: AIMS:  , ,  ,  ,    CIWA:  CIWA-Ar Total: 3 COWS:     Musculoskeletal: Strength & Muscle Tone: within normal limits Gait & Station: normal Patient leans: N/A   Psychiatric Specialty Exam:  Presentation  General Appearance:  Appropriate for Environment; Neat  Eye Contact: Good  Speech: Clear and Coherent; Normal Rate  Speech Volume: Normal  Handedness: Right   Mood and Affect  Mood: Euthymic  Affect: Appropriate; Congruent   Thought Process  Thought Processes: Coherent  Descriptions of Associations:Intact  Orientation:Full (Time, Place and Person) (and situation)  Thought Content:WDL  History of Schizophrenia/Schizoaffective disorder:none reported Duration of Psychotic Symptoms: none reported Hallucinations:Hallucinations: None Description of Command Hallucinations: denies  Ideas of Reference:None  Suicidal Thoughts:Suicidal Thoughts: No SI Active Intent and/or Plan: -- (denies) SI Passive Intent and/or Plan: -- (denies)  Homicidal Thoughts:Homicidal Thoughts: No   Sensorium  Memory: Immediate Good; Remote  Good  Judgment: Good  Insight: Good   Executive Functions  Concentration: Good  Attention Span: Good  Recall: Dudley Major of Knowledge: Good  Language: Good   Psychomotor Activity  Psychomotor Activity: Psychomotor Activity: Normal   Assets  Assets: Financial Resources/Insurance; Housing;  Social Support   Sleep  Sleep: Sleep: Good Number of Hours of Sleep: 6    Physical Exam: Physical Exam Vitals and nursing note reviewed.  Constitutional:      Appearance: Normal appearance.  HENT:     Head: Normocephalic and atraumatic.     Nose: Nose normal.  Musculoskeletal:        General: Normal range of motion.     Cervical back: Normal range of motion.  Neurological:     General: No focal deficit present.     Mental Status: He is alert and oriented to person, place, and time. Mental status is at baseline.  Psychiatric:        Attention and Perception: Attention and perception normal.        Mood and Affect: Mood and affect normal.        Speech: Speech normal.        Behavior: Behavior normal. Behavior is cooperative.        Thought Content: Thought content normal.        Cognition and Memory: Cognition and memory normal.        Judgment: Judgment normal.    Review of Systems  All other systems reviewed and are negative.  Blood pressure 124/85, pulse 93, temperature 98.5 F (36.9 C), temperature source Oral, resp. rate 18, height 5\' 8"  (1.727 m), weight 105.2 kg, SpO2 97%. Body mass index is 35.28 kg/m.   Social History   Tobacco Use  Smoking Status Former   Current packs/day: 0.00   Average packs/day: 0.5 packs/day for 18.0 years (9.0 ttl pk-yrs)   Types: Cigarettes   Start date: 03/25/2002   Quit date: 03/25/2020   Years since quitting: 3.3  Smokeless Tobacco Never   Tobacco Cessation:  N/A, patient does not currently use tobacco products   Blood Alcohol level:  Lab Results  Component Value Date   ETH 356 (HH) 07/29/2023   ETH 40 (H) 03/01/2023    Metabolic Disorder Labs:  No results found for: "HGBA1C", "MPG" No results found for: "PROLACTIN" No results found for: "CHOL", "TRIG", "HDL", "CHOLHDL", "VLDL", "LDLCALC"  See Psychiatric Specialty Exam and Suicide Risk Assessment completed by Attending Physician prior to discharge.  Discharge  destination:  Home  Is patient on multiple antipsychotic therapies at discharge:  No   Has Patient had three or more failed trials of antipsychotic monotherapy by history:  No  Recommended Plan for Multiple Antipsychotic Therapies: NA   Allergies as of 08/03/2023   No Known Allergies      Medication List     STOP taking these medications    chlordiazePOXIDE 25 MG capsule Commonly known as: LIBRIUM   MULTIVITAMIN MEN PO   vitamin D3 50 MCG (2000 UT) Caps       TAKE these medications      Indication  buPROPion 150 MG 24 hr tablet Commonly known as: WELLBUTRIN XL Take 1 tablet (150 mg total) by mouth daily. Start taking on: August 04, 2023 What changed:  additional instructions Another medication with the same name was removed. Continue taking this medication, and follow the directions you see here.  Indication: Major Depressive Disorder   busPIRone 5 MG tablet Commonly known  as: BUSPAR Take 1 tablet (5 mg total) by mouth 3 (three) times daily. What changed:  medication strength how much to take  Indication: Anxiety Disorder   gabapentin 300 MG capsule Commonly known as: NEURONTIN Take 1 capsule (300 mg total) by mouth 3 (three) times daily.  Indication: Abuse or Misuse of Alcohol   hydrOXYzine 50 MG tablet Commonly known as: ATARAX Take 1 tablet (50 mg total) by mouth every 8 (eight) hours as needed (sleep).  Indication: Feeling Tense   traZODone 50 MG tablet Commonly known as: DESYREL Take 1 tablet (50 mg total) by mouth at bedtime.  Indication: Abuse or Misuse of Alcohol, Trouble Sleeping        Follow-up Information     [facility]. Go to.   Specialty: Behavioral Health Why: Appointment scheduled 08/07/23 at 5 PM.  Please bring your I.D. with you for this appointmenent. You will recieve a reminder call the day before. Contact information: [address] [place] [address] [phone]                 Follow-up  recommendations:  Activity:  as tolerated Diet:  heart healthy  Comments:   Wellbutrin XR 150 mg daily, titrate to 300 mg and then to 450 mg as tolerated BuSpar 10 mg three times daily for anxiety Vitamin D3 50 mcg daily Multivitamin daily Gabapentin 300 mg by mouth three times daily for anxiety and withdrawal symptoms. Trazodone 50 mg by mouth as needed for insomnia. Vistaril 25 mg by mouth as needed every 8 hours for anxiety National Suicide Prevention Lifeline: 1-800-273-TALK 725-430-4706) Engage in Alcoholics Anonymous (AA) or a similar support group to build a sober support network. Avoid alcohol and high-risk situations that may trigger cravings. Facility: [facility] Specialty: [facility] Appointment Date & Time: August 07, 2023, at 5:00 PM Location: [address] [place], [place], [place] [address] Contact Information: [phone] text [phone] for support.  Signed: Myriam Forehand, NP 08/03/2023, 12:18 PM

## 2023-08-03 NOTE — Progress Notes (Signed)
Discharge Note:  Patient denies SI/HI/AVH at this time. Discharge instructions, AVS, prescriptions, and transition record were reviewed with patient. Patient agrees to comply with medication management, follow-up visit, and outpatient therapy. Patient personal belongings returned to patient. Patient questions and concerns addressed and answered. Patient ambulatory off unit. Patient discharged to home accompanied by father, self care.

## 2023-08-03 NOTE — Plan of Care (Signed)
  Problem: Education: Goal: Knowledge of [facility] Education information/materials will improve Outcome: Adequate for Discharge Goal: Emotional status will improve Outcome: Adequate for Discharge Goal: Mental status will improve Outcome: Adequate for Discharge Goal: Verbalization of understanding the information provided will improve Outcome: Adequate for Discharge   Problem: Activity: Goal: Interest or engagement in activities will improve Outcome: Adequate for Discharge Goal: Sleeping patterns will improve Outcome: Adequate for Discharge   Problem: Coping: Goal: Ability to verbalize frustrations and anger appropriately will improve Outcome: Adequate for Discharge Goal: Ability to demonstrate self-control will improve Outcome: Adequate for Discharge   Problem: Health Behavior/Discharge Planning: Goal: Identification of resources available to assist in meeting health care needs will improve Outcome: Adequate for Discharge Goal: Compliance with treatment plan for underlying cause of condition will improve Outcome: Adequate for Discharge   Problem: Physical Regulation: Goal: Ability to maintain clinical measurements within normal limits will improve Outcome: Adequate for Discharge   Problem: Safety: Goal: Periods of time without injury will increase Outcome: Adequate for Discharge   Problem: Education: Goal: Knowledge of disease or condition will improve Outcome: Adequate for Discharge Goal: Understanding of discharge needs will improve Outcome: Adequate for Discharge   Problem: Health Behavior/Discharge Planning: Goal: Ability to identify changes in lifestyle to reduce recurrence of condition will improve Outcome: Adequate for Discharge Goal: Identification of resources available to assist in meeting health care needs will improve Outcome: Adequate for Discharge   Problem: Physical Regulation: Goal: Complications related to the disease process, condition or  treatment will be avoided or minimized Outcome: Adequate for Discharge   Problem: Safety: Goal: Ability to remain free from injury will improve Outcome: Adequate for Discharge   Problem: Education: Goal: Utilization of techniques to improve thought processes will improve Outcome: Adequate for Discharge Goal: Knowledge of the prescribed therapeutic regimen will improve Outcome: Adequate for Discharge   Problem: Activity: Goal: Interest or engagement in leisure activities will improve Outcome: Adequate for Discharge Goal: Imbalance in normal sleep/wake cycle will improve Outcome: Adequate for Discharge   Problem: Coping: Goal: Coping ability will improve Outcome: Adequate for Discharge Goal: Will verbalize feelings Outcome: Adequate for Discharge   Problem: Health Behavior/Discharge Planning: Goal: Ability to make decisions will improve Outcome: Adequate for Discharge Goal: Compliance with therapeutic regimen will improve Outcome: Adequate for Discharge   Problem: Role Relationship: Goal: Will demonstrate positive changes in social behaviors and relationships Outcome: Adequate for Discharge   Problem: Safety: Goal: Ability to disclose and discuss suicidal ideas will improve Outcome: Adequate for Discharge Goal: Ability to identify and utilize support systems that promote safety will improve Outcome: Adequate for Discharge   Problem: Self-Concept: Goal: Will verbalize positive feelings about self Outcome: Adequate for Discharge Goal: Level of anxiety will decrease Outcome: Adequate for Discharge   Problem: Education: Goal: Ability to make informed decisions regarding treatment will improve Outcome: Adequate for Discharge   Problem: Coping: Goal: Coping ability will improve Outcome: Adequate for Discharge   Problem: Health Behavior/Discharge Planning: Goal: Identification of resources available to assist in meeting health care needs will improve Outcome: Adequate  for Discharge   Problem: Medication: Goal: Compliance with prescribed medication regimen will improve Outcome: Adequate for Discharge   Problem: Self-Concept: Goal: Ability to disclose and discuss suicidal ideas will improve Outcome: Adequate for Discharge Goal: Will verbalize positive feelings about self Outcome: Adequate for Discharge Note: Patient is  compliant and progress is adequate for discharge  . Patient will maintain adherence

## 2023-08-03 NOTE — Plan of Care (Signed)
  Problem: Education: Goal: Emotional status will improve Outcome: Progressing Goal: Mental status will improve Outcome: Progressing Goal: Verbalization of understanding the information provided will improve Outcome: Progressing  Patient C/O anxiety and sleep disturbance Provider notified Trazodone 50 mg PO given to Patient and effective. Patient is looking forward to discharge. Denies SI/HI/A/VH and verbally contracts for safety.

## 2023-08-03 NOTE — Progress Notes (Signed)
  [facility] Adult Case Management Discharge Plan :  Will you be returning to the same living situation after discharge:  Yes,  Patient will return home.  At discharge, do you have transportation home?: Yes,  Patient's father to provide transportation.  Do you have the ability to pay for your medications: Yes, [employer]   Release of information consent forms completed and in the chart;  Patient's signature needed at discharge.  Patient to Follow up at:  Follow-up Information     [facility]. Go to.   Specialty: Behavioral Health Why: Appointment scheduled 08/07/23 at 5 PM.  Please bring your I.D. with you for this appointmenent. You will recieve a reminder call the day before. Contact information: [address] [place] [address] [phone]                 Next level of care provider has access to [facility] Link:no  Safety Planning and Suicide Prevention discussed: No.Patient declined on admission. SPE material provided at discharge.     Has patient been referred to the Quitline?: Patient does not use tobacco/nicotine products  Patient has been referred for addiction treatment: Yes, the patient will follow up with an outpatient provider for substance use disorder. Psychiatrist/APP: appointment made  Lowry Ram, LCSW 08/03/2023, 10:19 AM

## 2023-08-03 NOTE — BHH Suicide Risk Assessment (Cosign Needed Addendum)
[facility] Discharge Suicide Risk Assessment   Principal Problem: MDD (major depressive disorder), recurrent severe, without psychosis (HCC) Discharge Diagnoses: Principal Problem:   MDD (major depressive disorder), recurrent severe, without psychosis (HCC)   Total Time spent with patient: 1 hour  Musculoskeletal: Strength & Muscle Tone: within normal limits Gait & Station: normal Patient leans: N/A  Psychiatric Specialty Exam  Presentation  General Appearance:  Appropriate for Environment; Neat  Eye Contact: Good  Speech: Clear and Coherent; Normal Rate  Speech Volume: Normal  Handedness: Right   Mood and Affect  Mood: Euthymic  Duration of Depression Symptoms: 3 months Affect: Appropriate; Congruent   Thought Process  Thought Processes: Coherent  Descriptions of Associations:Intact  Orientation:Full (Time, Place and Person) (and situation)  Thought Content:WDL  History of Schizophrenia/Schizoaffective disorder: none recorded Duration of Psychotic Symptoms:none recorded Hallucinations:Hallucinations: None Description of Command Hallucinations: denies  Ideas of Reference:None  Suicidal Thoughts:Suicidal Thoughts: No SI Active Intent and/or Plan: -- (denies) SI Passive Intent and/or Plan: -- (denies)  Homicidal Thoughts:Homicidal Thoughts: No   Sensorium  Memory: Immediate Good; Remote Good  Judgment: Good  Insight: Good   Executive Functions  Concentration: Good  Attention Span: Good  Recall: Good  Fund of Knowledge: Good  Language: Good   Psychomotor Activity  Psychomotor Activity: Psychomotor Activity: Normal   Assets  Assets: Financial Resources/Insurance; Housing; Social Support   Sleep  Sleep: Sleep: Good Number of Hours of Sleep: 6   Physical Exam: Physical Exam Vitals and nursing note reviewed.  Constitutional:      Appearance: Normal appearance.  HENT:     Head: Normocephalic and atraumatic.      Nose: Nose normal.  Pulmonary:     Effort: Pulmonary effort is normal.  Musculoskeletal:        General: Normal range of motion.     Cervical back: Normal range of motion.  Neurological:     General: No focal deficit present.     Mental Status: He is alert and oriented to person, place, and time. Mental status is at baseline.  Psychiatric:        Attention and Perception: Attention and perception normal.        Mood and Affect: Mood and affect normal.        Speech: Speech normal.        Behavior: Behavior normal. Behavior is cooperative.        Thought Content: Thought content normal.        Cognition and Memory: Cognition and memory normal.        Judgment: Judgment normal.    Review of Systems  All other systems reviewed and are negative.  Blood pressure 124/85, pulse 93, temperature 98.5 F (36.9 C), temperature source Oral, resp. rate 18, height 5\' 8"  (1.727 m), weight 105.2 kg, SpO2 97%. Body mass index is 35.28 kg/m.  Mental Status Per Nursing Assessment::   On Admission:  NA  Demographic Factors:  Male, Divorced or widowed, and Low socioeconomic status  Loss Factors: NA  Historical Factors: Family history of mental illness or substance abuse and Impulsivity  Risk Reduction Factors:   Sense of responsibility to family, Living with another person, especially a relative, Positive social support, Positive therapeutic relationship, and Positive coping skills or problem solving skills  Continued Clinical Symptoms:  Depression:   Comorbid alcohol abuse/dependence Insomnia Alcohol/Substance Abuse/Dependencies  Cognitive Features That Contribute To Risk:  None    Suicide Risk:  Minimal: No identifiable suicidal ideation.  Patients presenting  with no risk factors but with morbid ruminations; may be classified as minimal risk based on the severity of the depressive symptoms   Follow-up Information     [facility]. Go to.   Specialty: Behavioral Health Why:  Appointment scheduled 08/07/23 at 5 PM.  Please bring your I.D. with you for this appointmenent. You will recieve a reminder call the day before. Contact information: [address] [place] [address] [phone]                 Plan Of Care/Follow-up recommendations:  Activity:  as tolerated Diet:  heart healthy Wellbutrin XR 150 mg daily, titrate to 300 mg and then to 450 mg as tolerated BuSpar 10 mg three times daily for anxiety Vitamin D3 50 mcg daily Multivitamin daily Gabapentin 300 mg by mouth three times daily for anxiety and withdrawal symptoms. Trazodone 50 mg by mouth as needed for insomnia. Vistaril 25 mg by mouth as needed every 8 hours for anxiety National Suicide Prevention Lifeline: 1-800-273-TALK 7033535397) Engage in Alcoholics Anonymous (AA) or a similar support group to build a sober support network. Avoid alcohol and high-risk situations that may trigger cravings. Facility: [facility] Specialty: [facility] Appointment Date & Time: August 07, 2023, at 5:00 PM Location: [address] [place], [place], [place] [address] Contact Information: [phone] text [phone] for support. Chad Forehand, NP 08/03/2023, 11:52 AM
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
## 2023-08-03 NOTE — Group Note (Signed)
Date:  08/03/2023 Time:  10:21 AM  Group Topic/Focus:  Goals Group:   The focus of this group is to help patients establish daily goals to achieve during treatment and discuss how the patient can incorporate goal setting into their daily lives to aide in recovery.    Participation Level:  Active  Participation Quality:  Appropriate  Affect:  Appropriate  Cognitive:  Appropriate  Insight: Appropriate  Engagement in Group:  Engaged  Modes of Intervention:  Activity  Additional Comments:    Chad Walsh 08/03/2023, 10:21 AM

## 2023-10-08 ENCOUNTER — Emergency Department
Admission: EM | Admit: 2023-10-08 | Discharge: 2023-10-09 | Disposition: A | Discharge status: Home / Self Care | Payer: MEDICAID | Attending: Emergency Medicine | Admitting: Emergency Medicine

## 2023-10-08 ENCOUNTER — Other Ambulatory Visit: Payer: Self-pay

## 2023-10-08 ENCOUNTER — Emergency Department: Payer: MEDICAID

## 2023-10-08 ENCOUNTER — Encounter: Payer: Self-pay | Admitting: Emergency Medicine

## 2023-10-08 DIAGNOSIS — F10220 Alcohol dependence with intoxication, uncomplicated: Secondary | ICD-10-CM | POA: Insufficient documentation

## 2023-10-08 DIAGNOSIS — M79622 Pain in left upper arm: Secondary | ICD-10-CM | POA: Insufficient documentation

## 2023-10-08 DIAGNOSIS — R000 Tachycardia, unspecified: Secondary | ICD-10-CM | POA: Diagnosis not present

## 2023-10-08 DIAGNOSIS — S0990XA Unspecified injury of head, initial encounter: Secondary | ICD-10-CM | POA: Insufficient documentation

## 2023-10-08 DIAGNOSIS — F10920 Alcohol use, unspecified with intoxication, uncomplicated: Secondary | ICD-10-CM

## 2023-10-08 DIAGNOSIS — Y908 Blood alcohol level of 240 mg/100 ml or more: Secondary | ICD-10-CM | POA: Diagnosis not present

## 2023-10-08 DIAGNOSIS — W19XXXA Unspecified fall, initial encounter: Secondary | ICD-10-CM | POA: Insufficient documentation

## 2023-10-08 LAB — URINE DRUG SCREEN, QUALITATIVE (ARMC ONLY)
Amphetamines, Ur Screen: NOT DETECTED
Barbiturates, Ur Screen: NOT DETECTED
Benzodiazepine, Ur Scrn: NOT DETECTED
Cannabinoid 50 Ng, Ur ~~LOC~~: NOT DETECTED
Cocaine Metabolite,Ur ~~LOC~~: NOT DETECTED
MDMA (Ecstasy)Ur Screen: NOT DETECTED
Methadone Scn, Ur: NOT DETECTED
Opiate, Ur Screen: NOT DETECTED
Phencyclidine (PCP) Ur S: NOT DETECTED
Tricyclic, Ur Screen: NOT DETECTED

## 2023-10-08 LAB — CBC
HCT: 44.3 % (ref 39.0–52.0)
Hemoglobin: 15 g/dL (ref 13.0–17.0)
MCH: 29.2 pg (ref 26.0–34.0)
MCHC: 33.9 g/dL (ref 30.0–36.0)
MCV: 86.2 fL (ref 80.0–100.0)
Platelets: 484 10*3/uL — ABNORMAL HIGH (ref 150–400)
RBC: 5.14 MIL/uL (ref 4.22–5.81)
RDW: 15.8 % — ABNORMAL HIGH (ref 11.5–15.5)
WBC: 17.3 10*3/uL — ABNORMAL HIGH (ref 4.0–10.5)
nRBC: 0 % (ref 0.0–0.2)

## 2023-10-08 LAB — COMPREHENSIVE METABOLIC PANEL
ALT: 66 U/L — ABNORMAL HIGH (ref 0–44)
AST: 70 U/L — ABNORMAL HIGH (ref 15–41)
Albumin: 4.5 g/dL (ref 3.5–5.0)
Alkaline Phosphatase: 104 U/L (ref 38–126)
Anion gap: 18 — ABNORMAL HIGH (ref 5–15)
BUN: 25 mg/dL — ABNORMAL HIGH (ref 6–20)
CO2: 19 mmol/L — ABNORMAL LOW (ref 22–32)
Calcium: 8.6 mg/dL — ABNORMAL LOW (ref 8.9–10.3)
Chloride: 102 mmol/L (ref 98–111)
Creatinine, Ser: 0.78 mg/dL (ref 0.61–1.24)
GFR, Estimated: >60 mL/min (ref >60)
Glucose, Bld: 135 mg/dL — ABNORMAL HIGH (ref 70–99)
Potassium: 3.6 mmol/L (ref 3.5–5.1)
Sodium: 139 mmol/L (ref 135–145)
Total Bilirubin: 1.4 mg/dL — ABNORMAL HIGH (ref 0.0–1.2)
Total Protein: 7.6 g/dL (ref 6.5–8.1)

## 2023-10-08 LAB — ETHANOL: Alcohol, Ethyl (B): 306 mg/dL (ref <10)

## 2023-10-08 MED ORDER — ONDANSETRON 4 MG PO TBDP
4.0000 mg | ORAL_TABLET | Freq: Once | ORAL | Status: AC
  Administered 2023-10-08: 4 mg via ORAL
  Filled 2023-10-08: qty 1

## 2023-10-08 NOTE — ED Notes (Signed)
Patient dressed out at this time by Brennan Bailey, EDT at this time. Patient requesting ativan medication to EDT. This RN informed MD of patient request. Officer also informed patient was requesting nausea medicine. Patient said once MD assessed patient they would be able to make decision on medications they are able to provide patient with.

## 2023-10-08 NOTE — ED Notes (Signed)
MD stated patient would be able to be discharged as long as ride was available for patient. Patient did not bring phone to hospital. This RN attempted to call both sister and dad numbers in chart and patient left dad a voicemail. MD Alonna Buckler, Charge RN aware. Benedetto Goad and taxi are not options due to alcohol intoxication at this time.

## 2023-10-08 NOTE — ED Provider Notes (Signed)
[facility]: Lambert Rhonda W Provider Note    Event Date/Time   First MD Initiated Contact with Patient 10/08/23 2127     (approximate)   History   Alcohol Intoxication   HPI  Chad Walsh is a 43 y.o. male who presents to the emergency department today because of concerns for head injury whilst intoxicated.  Patient does have history of alcohol abuse.  States that he was drinking tonight.  He did fall and hit his head.  He is not sure exactly how he fell.  He does not think he lost consciousness.  He is also complaining of some pain in his left upper arm from the fall.     Physical Exam   Triage Vital Signs: ED Triage Vitals  Encounter Vitals Group     BP 10/08/23 2022 (!) 146/89     Systolic BP Percentile --      Diastolic BP Percentile --      Pulse Rate 10/08/23 2022 (!) 116     Resp 10/08/23 2022 18     Temp 10/08/23 2022 98.8 F (37.1 C)     Temp Source 10/08/23 2022 Oral     SpO2 10/08/23 2022 96 %     Weight 10/08/23 2018 231 lb 7.7 oz (105 kg)     Height 10/08/23 2018 5\' 8"  (1.727 m)     Head Circumference --      Peak Flow --      Pain Score --      Pain Loc --      Pain Education --      Exclude from Growth Chart --     Most recent vital signs: Vitals:   10/08/23 2022  BP: (!) 146/89  Pulse: (!) 116  Resp: 18  Temp: 98.8 F (37.1 C)  SpO2: 96%   General: Awake, alert, oriented. CV:  Good peripheral perfusion. Tachycardia. Resp:  Normal effort. Regular rate and rhythm. Abd:  No distention.    ED Results / Procedures / Treatments   Labs (all labs ordered are listed, but only abnormal results are displayed) Labs Reviewed  COMPREHENSIVE METABOLIC PANEL - Abnormal; Notable for the following components:      Result Value   CO2 19 (*)    Glucose, Bld 135 (*)    BUN 25 (*)    Calcium 8.6 (*)    AST 70 (*)    ALT 66 (*)    Total Bilirubin 1.4 (*)    Anion gap 18 (*)    All other components within normal limits  ETHANOL -  Abnormal; Notable for the following components:   Alcohol, Ethyl (B) 306 (*)    All other components within normal limits  CBC - Abnormal; Notable for the following components:   WBC 17.3 (*)    RDW 15.8 (*)    Platelets 484 (*)    All other components within normal limits  URINE DRUG SCREEN, QUALITATIVE ([facility] ONLY)     EKG  I, Phineas Semen, attending physician, personally viewed and interpreted this EKG  EKG Time: 2025 Rate: 116 Rhythm: sinus tachycardia Axis: normal Intervals: qtc 419 QRS: narrow ST changes: no st elevation Impression: abnormal ekg  RADIOLOGY I independently interpreted and visualized the CT head. My interpretation: No ICH Radiology interpretation:  IMPRESSION:  1. No acute intracranial process.   I independently interpreted and visualized the left humerus. My interpretation: No fracture Radiology interpretation:  IMPRESSION:  Negative.  PROCEDURES:  Critical Care performed: No    MEDICATIONS ORDERED IN ED: Medications - No data to display   IMPRESSION / MDM / ASSESSMENT AND PLAN / ED COURSE  I reviewed the triage vital signs and the nursing notes.                              Differential diagnosis includes, but is not limited to, ICH, contusion, concussion, fracture  Patient's presentation is most consistent with acute presentation with potential threat to life or bodily function.  Patient presented to the emergency department today because of concerns for head injury while intoxicated.  Also has complaints of left upper arm pain.  CT head did not show any concerning abnormalities.  X-ray of the left humerus without fracture or dislocation.  Blood work is consistent with alcohol intoxication.  At this time do think it be reasonable for patient to sober up at home.  However at this time we do not have a current safe transportation.  Will prepare paperwork in the event that family calls back.      FINAL CLINICAL IMPRESSION(S)  / ED DIAGNOSES   Final diagnoses:  Alcoholic intoxication without complication (HCC)    Note:  This document was prepared using Dragon voice recognition software and may include unintentional dictation errors.    Phineas Semen, MD 10/08/23 2229

## 2023-10-08 NOTE — ED Notes (Signed)
This tech and EDT Byrd Hesselbach dressed Pt out in room 23; Pt belongings included:  1 pair black shoes 1 pair of black sweatpants 1 red shirt

## 2023-10-08 NOTE — ED Notes (Addendum)
Patient requesting Zofran and Ativan specifically from [place], [place] and this RN. Both have explained to patient cannot just give him medication a doctor has to order it in order for [place] to give to the patient. Informed MD Derrill Kay of patients continuous request and he prescribed 4 mg of Zofran but does not believe at this time Ativan is needed. Gave patient Zofran and he gets upset the MD will not prescribe any Ativan and stating he "is having withdrawals" When asked what withdrawals he is experiencing patient could not explain how he was experiencing any type of withdrawals. No s/s of withdrawals at this time.

## 2023-10-08 NOTE — ED Triage Notes (Addendum)
Patient here states "I think I'm overdosing" and states he's been drinking liquor for days. Patient he has an abrasion on his forehead, hit head, fell today, hurt left arm in the fall. Denies LOC, not on a blood thinner. Patient is Aox3.

## 2023-10-08 NOTE — ED Notes (Addendum)
Patient has come up to nurse's station several times stating "I'm not well, I need help". Patient will not answer any other questions. Patient does have psych history.

## 2023-10-09 MED ORDER — LORAZEPAM 2 MG PO TABS
2.0000 mg | ORAL_TABLET | Freq: Once | ORAL | Status: AC
  Administered 2023-10-09: 2 mg via ORAL
  Filled 2023-10-09: qty 1

## 2023-10-09 NOTE — ED Notes (Addendum)
EDP at bedside to reassess pt. Pt appears clinically sober, ambulating, drinking PO, urinating appropriately. A&OX4.

## 2023-10-09 NOTE — ED Notes (Addendum)
Pt provided both phone numbers for emergency contact sister and father. Walked to lobby by Charity fundraiser and showed how to use hospital phone to facilitate ride home. Sister, Verda Cumins contacted by RN and pt both; she is aware that pt is here and will assist in getting in touch with father for pt ride home. Additionally showed pt how to call Taxi service with hospital phone, as he states he has money at home to pay for it. Verified correct patient and correct discharge papers given. Pt alert and oriented X 4. RR even and unlabored, color WNL. Discussed discharge instructions and follow-up as directed. Discharge medications discussed, when prescribed. Pt had opportunity to ask questions, and RN available to provide patient and/or family education. Returned all of belongings. Given hospital meal.

## 2023-10-09 NOTE — ED Provider Notes (Signed)
Sober on my reevaluation. Revonda Standard is working on getting a cab ride for the patient, his sister is going to order it for him. He is starting to feel anxious.  He does not appear to be withdrawing.  I will give him Ativan orally for anxiety. Discharge.   Pilar Jarvis, MD 10/09/23 [phone]

## 2023-10-09 NOTE — ED Notes (Signed)
Pt asked for a pair of dry pants. Pt states that he finally peed and wet himself. Pt was provided a pair of scrub pants, a pair of clean underwear, a pair of clean socks and some wipes.

## 2023-11-17 IMAGING — US US ABDOMEN LIMITED
1 series · 14 of 25 positions shown · non-contrast
Comparison: 08/01/2020

CLINICAL DATA: Abnormal liver function test

EXAM:
ULTRASOUND ABDOMEN LIMITED RIGHT UPPER QUADRANT

[Series 1: general · 14 of 46 slices shown]
[im 1/46]
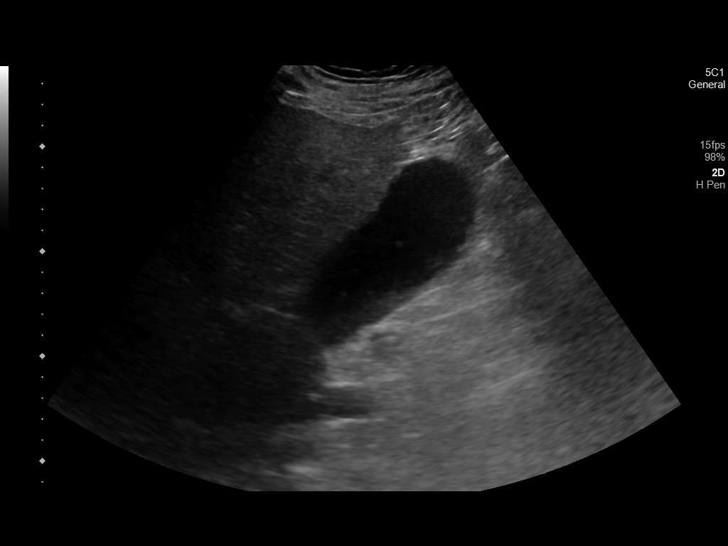
[im 4/46]
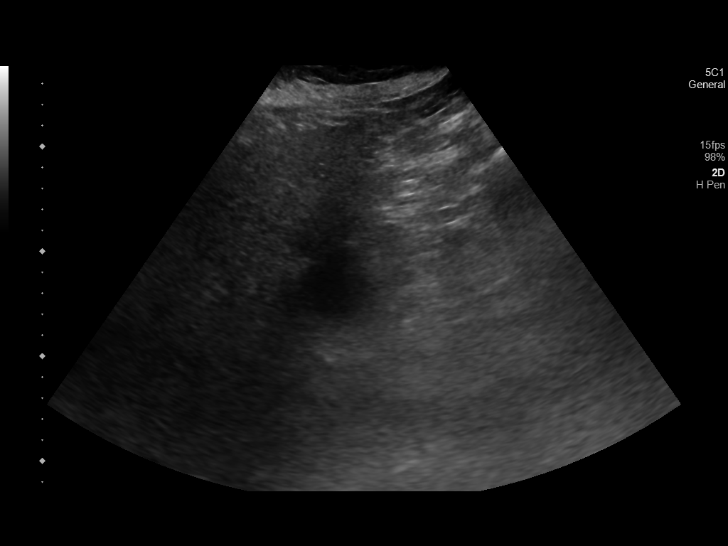
[im 8/46]
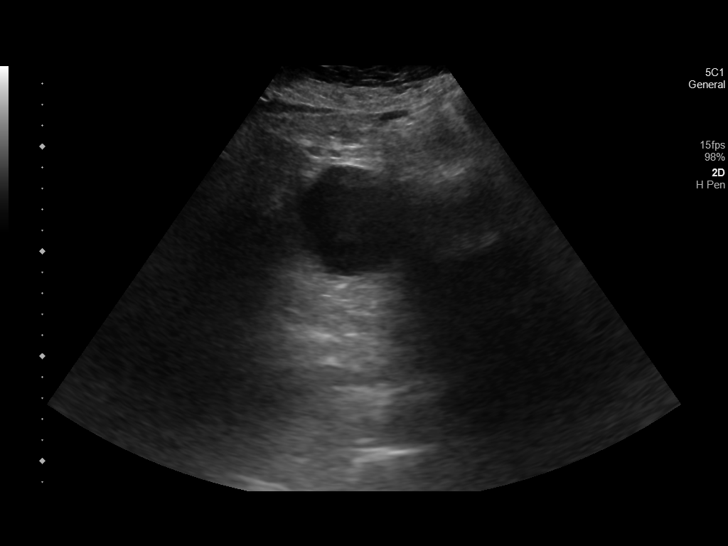
[im 12/46]
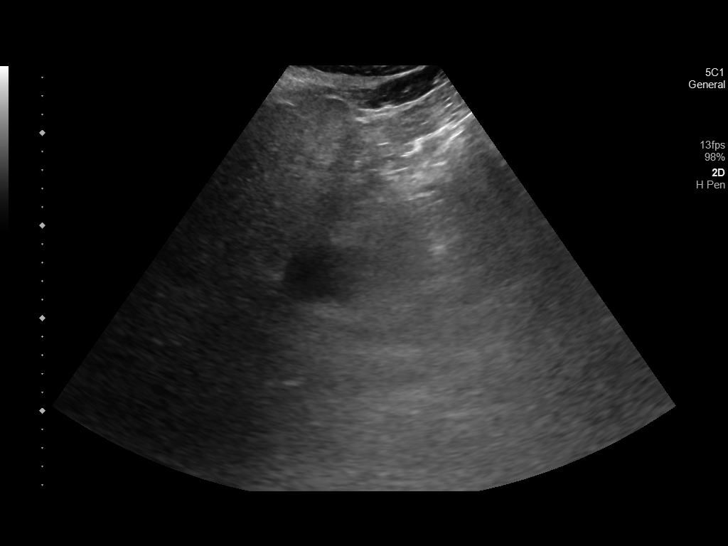
[im 16/46]
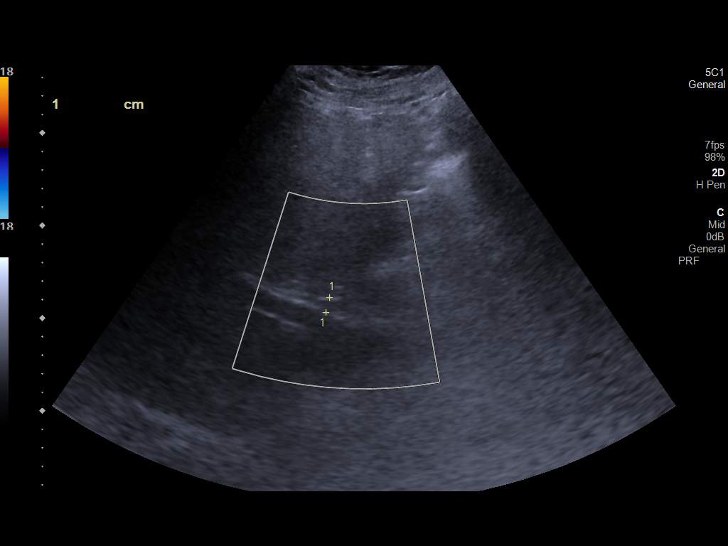
[im 17/46]
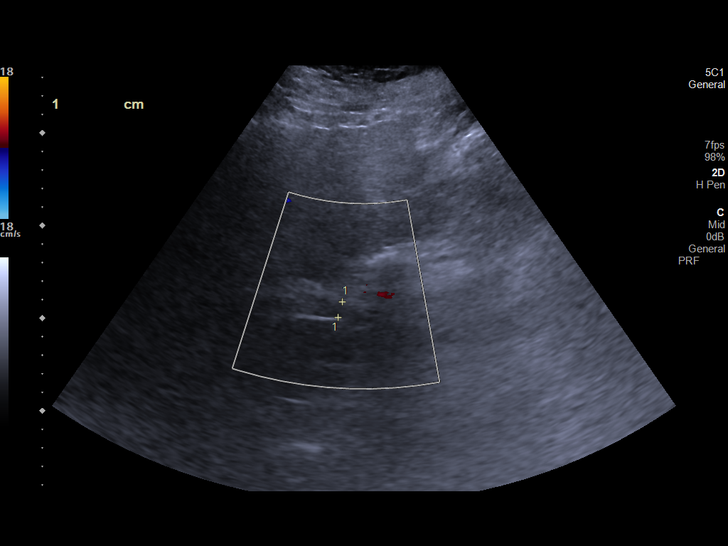
[im 21/46]
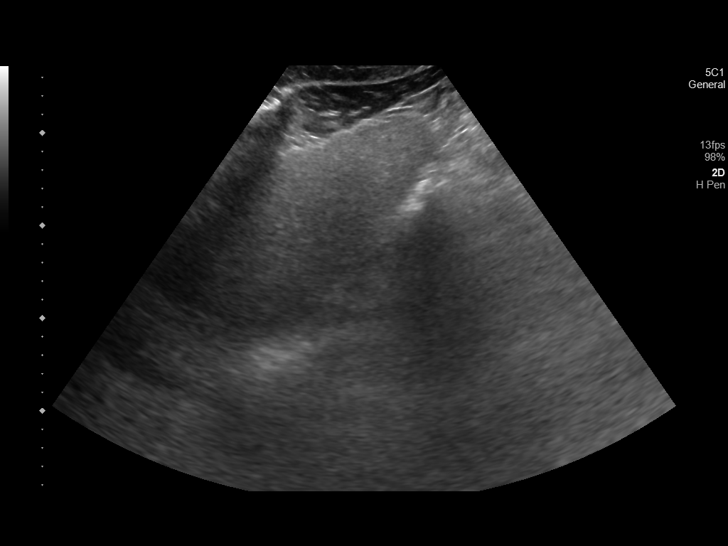
[im 25/46]
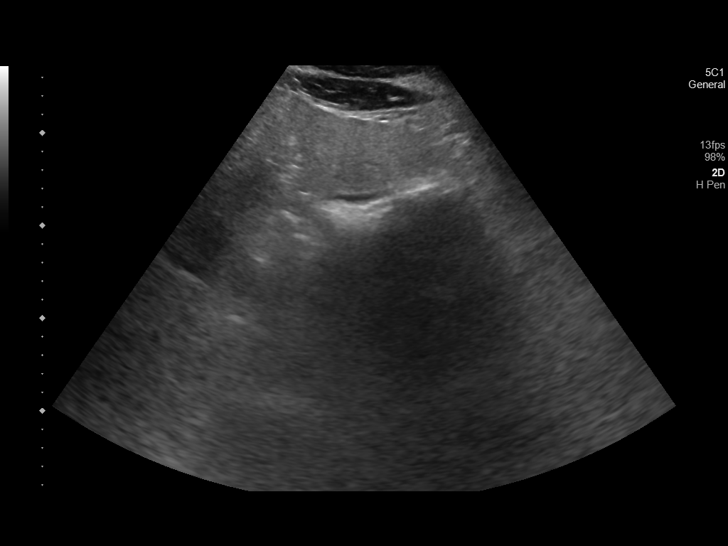
[im 29/46]
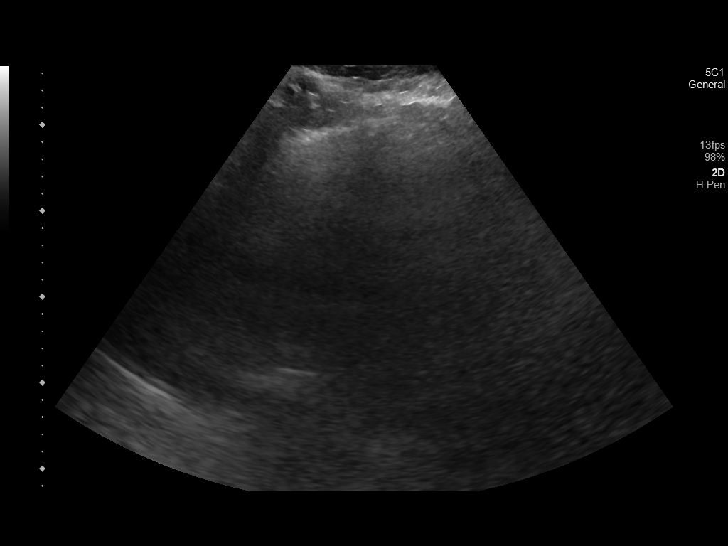
[im 31/46]
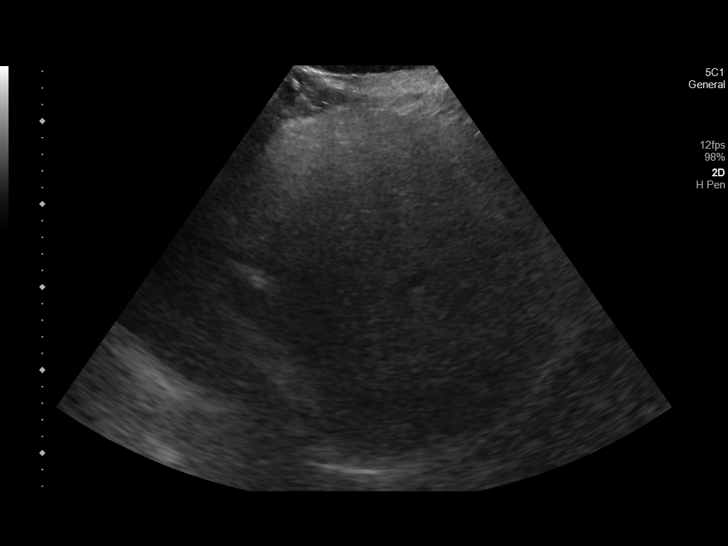
[im 34/46]
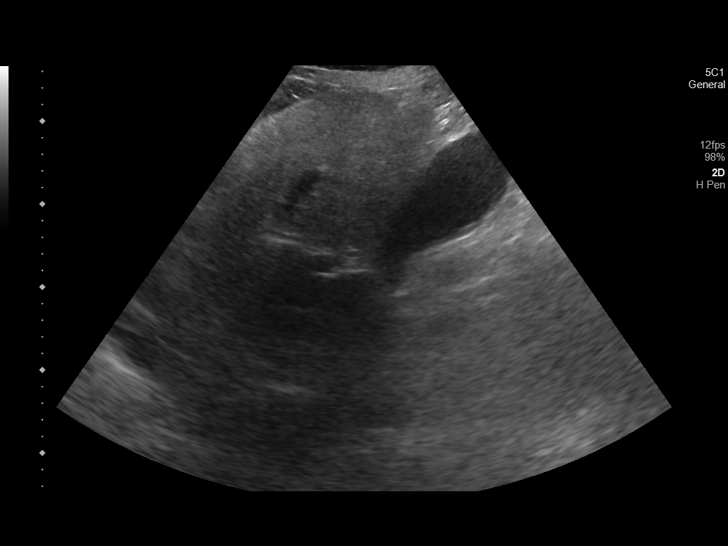
[im 38/46]
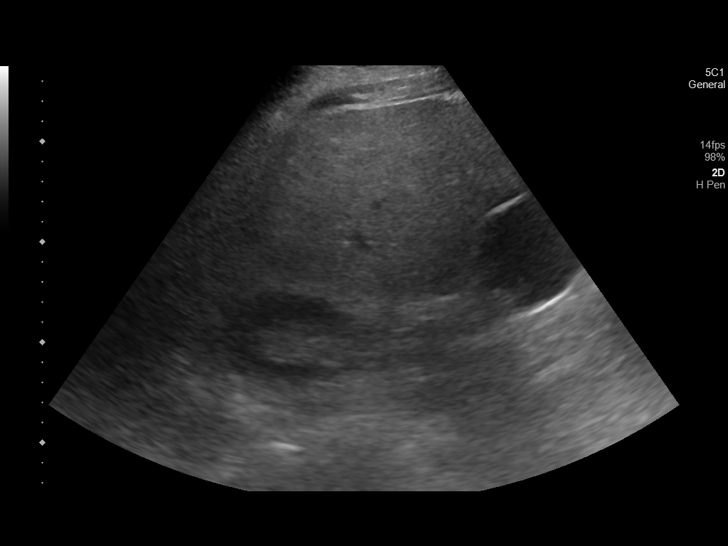
[im 42/46]
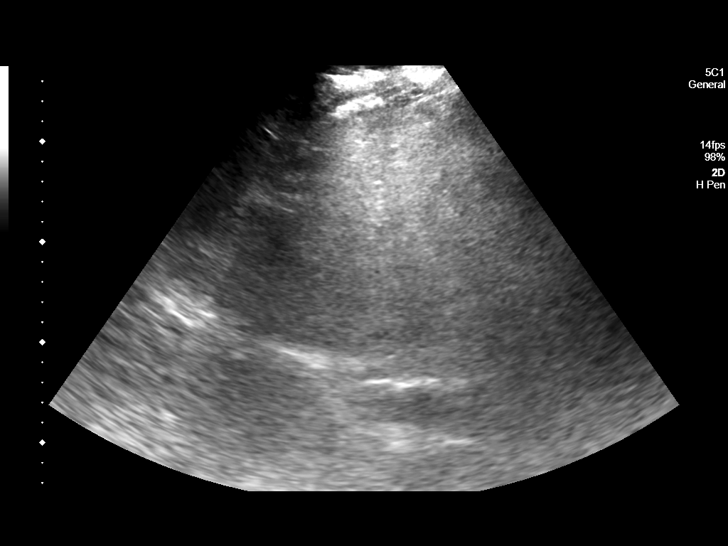
[im 46/46]
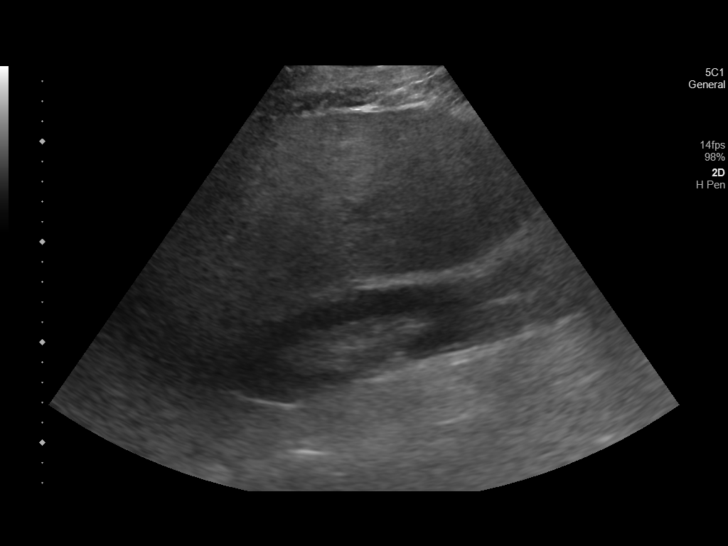

[14 of 25 positions shown; findings below may reference images not displayed]

FINDINGS: Gallbladder:

Normal wall thickness measuring 2.5 mm. No Murphy's sign. No
definite gallstones. Layering gallbladder sludge as before. No
pericholecystic fluid or signs of cholecystitis.

Common bile duct:

Diameter: 8 mm

Liver:

Increased echogenicity compatible with hepatic steatosis. Some
degree of surface nodularity suggesting cirrhosis as well. No large
focal abnormality or intrahepatic biliary dilatation. Portal vein is
patent on color Doppler imaging with normal direction of blood flow
towards the liver.

Other: Included views of the right kidney demonstrate no
hydronephrosis. No surrounding free fluid or ascites.
IMPRESSION: Gallbladder sludge.  Negative for gallstones or cholecystitis

Hepatic steatosis and cirrhosis.

## 2024-06-21 ENCOUNTER — Ambulatory Visit: Payer: MEDICAID | Admitting: Family Medicine

## 2024-09-11 NOTE — Patient Instructions (Signed)
 Healthy Eating, Adult Healthy eating may help you get and keep a healthy body weight, reduce the risk of chronic disease, and live a long and productive life. It is important to follow a healthy eating pattern. Your nutritional and calorie needs should be met mainly by different nutrient-rich foods. What are tips for following this plan? Reading food labels Read labels and choose the following: Reduced or low sodium products. Juices with 100% fruit juice. Foods with low saturated fats (<3 g per serving) and high polyunsaturated and monounsaturated fats. Foods with whole grains, such as whole wheat, cracked wheat, brown rice, and wild rice. Whole grains that are fortified with folic acid . This is recommended for females who are pregnant or who want to become pregnant. Read labels and do not eat or drink the following: Foods or drinks with added sugars. These include foods that contain brown sugar, corn sweetener, corn syrup, dextrose , fructose, glucose, high-fructose corn syrup, honey, invert sugar, lactose, malt syrup, maltose, molasses, raw sugar, sucrose, trehalose, or turbinado sugar. Limit your intake of added sugars to less than 10% of your total daily calories. Do not eat more than the following amounts of added sugar per day: 6 teaspoons (25 g) for females. 9 teaspoons (38 g) for males. Foods that contain processed or refined starches and grains. Refined grain products, such as white flour, degermed cornmeal, white bread, and white rice. Shopping Choose nutrient-rich snacks, such as vegetables, whole fruits, and nuts. Avoid high-calorie and high-sugar snacks, such as potato chips, fruit snacks, and candy. Use oil-based dressings and spreads on foods instead of solid fats such as butter, margarine, sour cream, or cream cheese. Limit pre-made sauces, mixes, and instant products such as flavored rice, instant noodles, and ready-made pasta. Try more plant-protein sources, such as tofu,  tempeh, black beans, edamame, lentils, nuts, and seeds. Explore eating plans such as the Mediterranean diet or vegetarian diet. Try heart-healthy dips made with beans and healthy fats like hummus and guacamole. Vegetables go great with these. Cooking Use oil to saut or stir-fry foods instead of solid fats such as butter, margarine, or lard. Try baking, boiling, grilling, or broiling instead of frying. Remove the fatty part of meats before cooking. Steam vegetables in water  or broth. Meal planning  At meals, imagine dividing your plate into fourths: One-half of your plate is fruits and vegetables. One-fourth of your plate is whole grains. One-fourth of your plate is protein, especially lean meats, poultry, eggs, tofu, beans, or nuts. Include low-fat dairy as part of your daily diet. Lifestyle Choose healthy options in all settings, including home, work, school, restaurants, or stores. Prepare your food safely: Wash your hands after handling raw meats. Where you prepare food, keep surfaces clean by regularly washing with hot, soapy water . Keep raw meats separate from ready-to-eat foods, such as fruits and vegetables. Cook seafood, meat, poultry, and eggs to the recommended temperature. Get a food thermometer. Store foods at safe temperatures. In general: Keep cold foods at 34F (4.4C) or below. Keep hot foods at 134F (60C) or above. Keep your freezer at [facility] (-17.8C) or below. Foods are not safe to eat if they have been between the temperatures of 40-134F (4.4-60C) for more than 2 hours. What foods should I eat? Fruits Aim to eat 1-2 cups of fresh, canned (in natural juice), or frozen fruits each day. One cup of fruit equals 1 small apple, 1 large banana, 8 large strawberries, 1 cup (237 g) canned fruit,  cup (82 g) dried fruit,  or 1 cup (240 mL) 100% juice. Vegetables Aim to eat 2-4 cups of fresh and frozen vegetables each day, including different varieties and colors. One cup  of vegetables equals 1 cup (91 g) broccoli or cauliflower florets, 2 medium carrots, 2 cups (150 g) raw, leafy greens, 1 large tomato, 1 large bell pepper, 1 large sweet potato, or 1 medium white potato. Grains Aim to eat 5-10 ounce-equivalents of whole grains each day. Examples of 1 ounce-equivalent of grains include 1 slice of bread, 1 cup (40 g) ready-to-eat cereal, 3 cups (24 g) popcorn, or  cup (93 g) cooked rice. Meats and other proteins Try to eat 5-7 ounce-equivalents of protein each day. Examples of 1 ounce-equivalent of protein include 1 egg,  oz nuts (12 almonds, 24 pistachios, or 7 walnut halves), 1/4 cup (90 g) cooked beans, 6 tablespoons (90 g) hummus or 1 tablespoon (16 g) peanut butter. A cut of meat or fish that is the size of a deck of cards is about 3-4 ounce-equivalents (85 g). Of the protein you eat each week, try to have at least 8 sounce (227 g) of seafood. This is about 2 servings per week. This includes salmon, trout, herring, sardines, and anchovies. Dairy Aim to eat 3 cup-equivalents of fat-free or low-fat dairy each day. Examples of 1 cup-equivalent of dairy include 1 cup (240 mL) milk, 8 ounces (250 g) yogurt, 1 ounces (44 g) natural cheese, or 1 cup (240 mL) fortified soy milk. Fats and oils Aim for about 5 teaspoons (21 g) of fats and oils per day. Choose monounsaturated fats, such as canola and olive oils, mayonnaise made with olive oil or avocado oil, avocados, peanut butter, and most nuts, or polyunsaturated fats, such as sunflower, corn, and soybean oils, walnuts, pine nuts, sesame seeds, sunflower seeds, and flaxseed. Beverages Aim for 6 eight-ounce glasses of water  per day. Limit coffee to 3-5 eight-ounce cups per day. Limit caffeinated beverages that have added calories, such as soda and energy drinks. If you drink alcohol: Limit how much you have to: 0-1 drink a day if you are male. 0-2 drinks a day if you are male. Know how much alcohol is in your drink.  In the [place] one drink is one 12 oz bottle of beer (355 mL), one 5 oz glass of wine (148 mL), or one 1 oz glass of hard liquor (44 mL). Seasoning and other foods Try not to add too much salt to your food. Try using herbs and spices instead of salt. Try not to add sugar to food. This information is based on [place] nutrition guidelines. To learn more, visit [employer]. Exact amounts may vary. You may need different amounts. This information is not intended to replace advice given to you by your health care provider. Make sure you discuss any questions you have with your health care provider. Document Revised: 05/12/2022 Document Reviewed: 05/12/2022 Elsevier Patient Education  2024 [employer].

## 2024-09-15 ENCOUNTER — Encounter: Payer: Self-pay | Admitting: Nurse Practitioner

## 2024-09-15 ENCOUNTER — Ambulatory Visit (INDEPENDENT_AMBULATORY_CARE_PROVIDER_SITE_OTHER): Payer: MEDICAID | Admitting: Nurse Practitioner

## 2024-09-15 VITALS — BP 130/84 | HR 89 | Temp 98.1°F | Ht 68.6 in | Wt 279.8 lb

## 2024-09-15 DIAGNOSIS — Z136 Encounter for screening for cardiovascular disorders: Secondary | ICD-10-CM | POA: Diagnosis not present

## 2024-09-15 DIAGNOSIS — K709 Alcoholic liver disease, unspecified: Secondary | ICD-10-CM

## 2024-09-15 DIAGNOSIS — E559 Vitamin D deficiency, unspecified: Secondary | ICD-10-CM

## 2024-09-15 DIAGNOSIS — Z6841 Body Mass Index (BMI) 40.0 and over, adult: Secondary | ICD-10-CM | POA: Diagnosis not present

## 2024-09-15 DIAGNOSIS — Z13220 Encounter for screening for lipoid disorders: Secondary | ICD-10-CM

## 2024-09-15 DIAGNOSIS — Z7689 Persons encountering health services in other specified circumstances: Secondary | ICD-10-CM | POA: Diagnosis not present

## 2024-09-15 DIAGNOSIS — F332 Major depressive disorder, recurrent severe without psychotic features: Secondary | ICD-10-CM

## 2024-09-15 DIAGNOSIS — E669 Obesity, unspecified: Secondary | ICD-10-CM | POA: Insufficient documentation

## 2024-09-15 DIAGNOSIS — E66813 Obesity, class 3: Secondary | ICD-10-CM | POA: Diagnosis not present

## 2024-09-15 DIAGNOSIS — F1021 Alcohol dependence, in remission: Secondary | ICD-10-CM

## 2024-09-15 NOTE — Assessment & Plan Note (Signed)
 BMI 41.80. Recommended eating smaller high protein, low fat meals more frequently and exercising 30 mins a day 5 times a week with a goal of 10-15lb weight loss in the next 3 months. Patient voiced their understanding and motivation to adhere to these recommendations.

## 2024-09-15 NOTE — Assessment & Plan Note (Signed)
 Has been sober since March 2025, recommend continued cessation. Recheck labs today. Refer to GI as needed.

## 2024-09-15 NOTE — Assessment & Plan Note (Signed)
 Stable at present, sober since March 7th, 2025. Will continue to support his journey. Has supportive father who he lives with. Not going to AA, did not like that environment. May benefit return to talk therapy in the future. Continue to collaborate with Jarrod Kanady FNP for mental health needs.

## 2024-09-15 NOTE — Assessment & Plan Note (Signed)
 Chronic, ongoing. Check level today and continue supplement at home, adjust as needed.

## 2024-09-15 NOTE — Progress Notes (Signed)
 "  New Patient Office Visit  Subjective    Patient ID: Chad Walsh, male    DOB: 12-29-1980  Age: 44 y.o. MRN: [id number]  CC:  Chief Complaint  Patient presents with   Anxiety    Patient states he has been seeing Jarrod Kanady, FNP for his medications. Currently working on weaning off of a couple of them.    Depression    HPI Chad Walsh presents for new patient visit to establish care.  Introduced to publishing rights manager role and practice setting.  All questions answered.  Discussed provider/patient relationship and expectations. Has not seen a PCP in some time. Would like to keep up with overall health.  DEPRESSION/ANXIETY Has been following with Jarrod Kanady FNP for psychiatry. They will be stepping down off Wellbutrin  and Lexapro . Had genetic testing with him and so they are changing medications. Will continue Buspar  and Viibryd. Visits with him every 2 weeks to 3 months. In past tried Prozac which worked for 6 months but then had adverse reactions and had to stop.  Since he was 36 he started having issues with alcohol abuse, this became worse after mom passed in November 2021. They were close. This caused his mood to spiral. Went to [facility] at the time. Currently has moved in with his dad to get support and help keep him accountable. Has been sober since March 7th, 2025. The longest he has gone without relapsing in past was 15 months. Will binge when does drink and then will go to hospital due to withdrawals. Not currently going to AA. It is not for him. At times his LFTs have been elevated in the past due to alcohol use. 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 Has Vitamin D  deficiency and takes supplement. Mood status: stable Satisfied with current treatment?: yes Symptom severity: moderate  Duration of current treatment : chronic Side effects: no Medication compliance: good compliance Psychotherapy/counseling: yes in the past Depressed mood: occasional Anxious  mood: yes Anhedonia: no Significant weight loss or gain: no Insomnia: none Fatigue: sometimes Feelings of worthlessness or guilt: yes Impaired concentration/indecisiveness: yes Suicidal ideations: occasional -- his father is aware and has a safety plan with them, has called hospital in the past. Has been admitted to behavioral unit in the past. No guns in home and no plans. Hopelessness: at times Crying spells: no    09/15/2024    9:11 AM  Depression screen PHQ 2/9  Decreased Interest 1  Down, Depressed, Hopeless 1  PHQ - 2 Score 2  Altered sleeping 0  Tired, decreased energy 1  Change in appetite 0  Feeling bad or failure about yourself  1  Trouble concentrating 2  Moving slowly or fidgety/restless 0  Suicidal thoughts 1  PHQ-9 Score 7  Difficult doing work/chores Somewhat difficult       09/15/2024    9:12 AM  GAD 7 : Generalized Anxiety Score  Nervous, Anxious, on Edge 1  Control/stop worrying 1  Worry too much - different things 1  Trouble relaxing 1  Restless 1  Easily annoyed or irritable 1  Afraid - awful might happen 1  Total GAD 7 Score 7  Anxiety Difficulty Somewhat difficult   Outpatient Encounter Medications as of 09/15/2024  Medication Sig   buPROPion  (WELLBUTRIN  XL) 300 MG 24 hr tablet Take 300 mg by mouth daily.   busPIRone  (BUSPAR ) 30 MG tablet Take 30 mg by mouth 2 (two) times daily.   escitalopram  (LEXAPRO ) 10 MG  tablet Take 10 mg by mouth daily.   Vilazodone HCl (VIIBRYD) 10 MG TABS Take 10 mg by mouth daily.   [DISCONTINUED] buPROPion  (WELLBUTRIN  XL) 150 MG 24 hr tablet Take 1 tablet (150 mg total) by mouth daily.   [DISCONTINUED] busPIRone  (BUSPAR ) 10 MG tablet Take 10 mg by mouth 2 (two) times daily.   [DISCONTINUED] gabapentin  (NEURONTIN ) 300 MG capsule Take 1 capsule (300 mg total) by mouth 3 (three) times daily.   [DISCONTINUED] traZODone  (DESYREL ) 50 MG tablet Take 1 tablet (50 mg total) by mouth at bedtime.   No facility-administered  encounter medications on file as of 09/15/2024.    Past Medical History:  Diagnosis Date   Anxiety    Depression    ETOH abuse    GERD (gastroesophageal reflux disease)     Past Surgical History:  Procedure Laterality Date   ESOPHAGOGASTRODUODENOSCOPY (EGD) WITH PROPOFOL  N/A 11/13/2021   Procedure: ESOPHAGOGASTRODUODENOSCOPY (EGD) WITH PROPOFOL ;  Surgeon: Unk Corinn Skiff, MD;  Location: [facility] END[facility]COPY;  Service: Gastroenterology;  Laterality: N/A;   ORIF ANKLE FRACTURE Left 09/27/2020   Procedure: OPEN REDUCTION INTERNAL FIXATION (ORIF) ANKLE FRACTURE, SYNDOSIS REPAIR;  Surgeon: Tobie Priest, MD;  Location: [facility] ORS;  Service: Orthopedics;  Laterality: Left;   TONSILLECTOMY     WISDOM TOOTH EXTRACTION      Family History  Problem Relation Age of Onset   Lung cancer Mother    Depression Sister    Anxiety disorder Sister    Hypertension Sister    Lung cancer Maternal Aunt    Lung cancer Maternal Uncle    Prostate cancer Paternal Grandfather     Social History   Socioeconomic History   Marital status: Single    Spouse name: Not on file   Number of children: Not on file   Years of education: Not on file   Highest education level: Not on file  Occupational History   Not on file  Tobacco Use   Smoking status: Former    Current packs/day: 0.00    Average packs/day: 0.5 packs/day for 18.0 years (9.0 ttl pk-yrs)    Types: Cigarettes    Start date: 03/25/2002    Quit date: 03/25/2020    Years since quitting: 4.4   Smokeless tobacco: Never  Vaping Use   Vaping status: Never Used  Substance and Sexual Activity   Alcohol use: Not Currently    Comment: no alcohol for the last 10 months   Drug use: Not Currently    Comment: early 2021 last used   Sexual activity: Not Currently  Other Topics Concern   Not on file  Social History Narrative   Not on file   Social Drivers of Health   Tobacco Use: Medium Risk (09/15/2024)   Patient History    Smoking Tobacco Use: Former     Smokeless Tobacco Use: Never    Passive Exposure: Not on file  Financial Resource Strain: Low Risk (09/15/2024)   Overall Financial Resource Strain (CARDIA)    Difficulty of Paying Living Expenses: Not hard at all  Food Insecurity: No Food Insecurity (09/15/2024)   Epic    Worried About Radiation Protection Practitioner of Food in the Last Year: Never true    Ran Out of Food in the Last Year: Never true  Transportation Needs: No Transportation Needs (09/15/2024)   Epic    Lack of Transportation (Medical): No    Lack of Transportation (Non-Medical): No  Physical Activity: Inactive (09/15/2024)   Exercise Vital Sign  Days of Exercise per Week: 0 days    Minutes of Exercise per Session: 0 min  Stress: No Stress Concern Present (09/15/2024)   [employer] of Occupational Health - Occupational Stress Questionnaire    Feeling of Stress: Not at all  Social Connections: Socially Isolated (09/15/2024)   Social Connection and Isolation Panel    Frequency of Communication with Friends and Family: Twice a week    Frequency of Social Gatherings with Friends and Family: Twice a week    Attends Religious Services: Never    Database Administrator or Organizations: No    Attends Banker Meetings: Never    Marital Status: Never married  Intimate Partner Violence: Not At Risk (09/15/2024)   Epic    Fear of Current or Ex-Partner: No    Emotionally Abused: No    Physically Abused: No    Sexually Abused: No  Depression (PHQ2-9): Medium Risk (09/15/2024)   Depression (PHQ2-9)    PHQ-2 Score: 7  Alcohol Screen: Low Risk (09/15/2024)   Alcohol Screen    Last Alcohol Screening Score (AUDIT): 0  Housing: Unknown (09/15/2024)   Epic    Unable to Pay for Housing in the Last Year: No    Number of Times Moved in the Last Year: Not on file    Homeless in the Last Year: No  Utilities: Not At Risk (09/15/2024)   Epic    Threatened with loss of utilities: No  Health Literacy: Adequate Health Literacy (09/15/2024)    B1300 Health Literacy    Frequency of need for help with medical instructions: Never    Review of Systems  Constitutional:  Negative for chills, diaphoresis, fever and weight loss.  Respiratory:  Negative for cough, shortness of breath and wheezing.   Cardiovascular:  Negative for chest pain, palpitations, orthopnea and leg swelling.  Neurological: Negative.   Endo/Heme/Allergies: Negative.   Psychiatric/Behavioral: Negative.         Objective    BP 130/84 (BP Location: Left Arm, Patient Position: Sitting, Cuff Size: Large)   Pulse 89   Temp 98.1 F (36.7 C) (Oral)   Ht 5' 8.6 (1.742 m)   Wt 279 lb 12.8 oz (126.9 kg)   SpO2 96%   BMI 41.80 kg/m   Physical Exam Vitals and nursing note reviewed.  Constitutional:      General: He is awake. He is not in acute distress.    Appearance: He is well-developed and well-groomed. He is obese. He is not ill-appearing or toxic-appearing.  HENT:     Head: Normocephalic.     Right Ear: Hearing and external ear normal.     Left Ear: Hearing and external ear normal.  Eyes:     General: Lids are normal.     Extraocular Movements: Extraocular movements intact.     Conjunctiva/sclera: Conjunctivae normal.  Neck:     Thyroid: No thyromegaly.     Vascular: No carotid bruit.  Cardiovascular:     Rate and Rhythm: Normal rate and regular rhythm.     Heart sounds: Normal heart sounds. No murmur heard.    No gallop.  Pulmonary:     Effort: Pulmonary effort is normal. No accessory muscle usage or respiratory distress.     Breath sounds: Normal breath sounds. No decreased breath sounds, wheezing or rales.  Abdominal:     General: Bowel sounds are normal. There is no distension.     Palpations: Abdomen is soft.     Tenderness: There  is no abdominal tenderness.  Musculoskeletal:     Cervical back: Full passive range of motion without pain.     Right lower leg: No edema.     Left lower leg: No edema.  Lymphadenopathy:     Cervical: No  cervical adenopathy.  Skin:    General: Skin is warm.     Capillary Refill: Capillary refill takes less than 2 seconds.  Neurological:     Mental Status: He is alert and oriented to person, place, and time.     Cranial Nerves: Cranial nerves 2-12 are intact.     Deep Tendon Reflexes: Reflexes are normal and symmetric.     Reflex Scores:      Brachioradialis reflexes are 2+ on the right side and 2+ on the left side.      Patellar reflexes are 2+ on the right side and 2+ on the left side. Psychiatric:        Attention and Perception: Attention normal.        Mood and Affect: Mood normal.        Speech: Speech normal.        Behavior: Behavior normal. Behavior is cooperative.        Thought Content: Thought content normal.     Last CBC Lab Results  Component Value Date   WBC 17.3 (H) 10/08/2023   HGB 15.0 10/08/2023   HCT 44.3 10/08/2023   MCV 86.2 10/08/2023   MCH 29.2 10/08/2023   RDW 15.8 (H) 10/08/2023   PLT 484 (H) 10/08/2023   Last metabolic panel Lab Results  Component Value Date   GLUCOSE 135 (H) 10/08/2023   NA 139 10/08/2023   K 3.6 10/08/2023   CL 102 10/08/2023   CO2 19 (L) 10/08/2023   BUN 25 (H) 10/08/2023   CREATININE 0.78 10/08/2023   GFRNONAA >60 10/08/2023   CALCIUM  8.6 (L) 10/08/2023   PROT 7.6 10/08/2023   ALBUMIN 4.5 10/08/2023   BILITOT 1.4 (H) 10/08/2023   ALKPHOS 104 10/08/2023   AST 70 (H) 10/08/2023   ALT 66 (H) 10/08/2023   ANIONGAP 18 (H) 10/08/2023      Assessment & Plan:   Problem List Items Addressed This Visit       Digestive   Alcoholic liver disease   Has been sober since March 2025, recommend continued cessation. Recheck labs today. Refer to GI as needed.        Other   Vitamin D  deficiency   Chronic, ongoing. Check level today and continue supplement at home, adjust as needed.      Relevant Orders   VITAMIN D  25 Hydroxy (Vit-D Deficiency, Fractures)   Obesity   BMI 41.80. Recommended eating smaller high protein,  low fat meals more frequently and exercising 30 mins a day 5 times a week with a goal of 10-15lb weight loss in the next 3 months. Patient voiced their understanding and motivation to adhere to these recommendations.       MDD (major depressive disorder), recurrent severe, without psychosis (HCC) - Primary   Chronic, ongoing. He reports occasional SI, but denies any plans. Reports he has a safety plan in place with his father and has no guns in the home. In past he has called ER when SI became worse and reports he would do this again if needed. Continue to collaborate with Jarrod Kanady FNP for mental health needs and current medications as prescribed by him. Would benefit return to talk therapy in the future.  Relevant Medications   escitalopram  (LEXAPRO ) 10 MG tablet   busPIRone  (BUSPAR ) 30 MG tablet   buPROPion  (WELLBUTRIN  XL) 300 MG 24 hr tablet   Vilazodone HCl (VIIBRYD) 10 MG TABS   Other Relevant Orders   TSH   Alcohol use disorder, moderate, in early remission, in controlled environment (HCC)   Stable at present, sober since March 7th, 2025. Will continue to support his journey. Has supportive father who he lives with. Not going to AA, did not like that environment. May benefit return to talk therapy in the future. Continue to collaborate with Jarrod Kanady FNP for mental health needs.      Relevant Orders   CBC with Differential/Platelet   Comprehensive metabolic panel with GFR   Vitamin B12   Folate   Ferritin   Iron   Other Visit Diagnoses       Encounter for lipid screening for cardiovascular disease       Lipid panel on labs today.   Relevant Orders   Lipid Panel w/o Chol/HDL Ratio     Encounter to establish care       New patient to clinic, introduced to clinic setting and provider.       Return in about 6 months (around 03/15/2025) for Depression, ANXIETY, History of heavy alcohol use.   Jlynn Ly T Yakira Duquette, NP   "

## 2024-09-15 NOTE — Assessment & Plan Note (Signed)
 Chronic, ongoing. He reports occasional SI, but denies any plans. Reports he has a safety plan in place with his father and has no guns in the home. In past he has called ER when SI became worse and reports he would do this again if needed. Continue to collaborate with Jarrod Kanady FNP for mental health needs and current medications as prescribed by him. Would benefit return to talk therapy in the future.

## 2024-09-16 ENCOUNTER — Ambulatory Visit: Payer: Self-pay | Admitting: Nurse Practitioner

## 2024-09-16 LAB — CBC WITH DIFFERENTIAL/PLATELET
Basophils Absolute: 0.1 x10E3/uL (ref 0.0–0.2)
Basos: 1 %
EOS (ABSOLUTE): 0.1 x10E3/uL (ref 0.0–0.4)
Eos: 1 %
Hematocrit: 44.5 % (ref 37.5–51.0)
Hemoglobin: 15.1 g/dL (ref 13.0–17.7)
Immature Grans (Abs): 0 x10E3/uL (ref 0.0–0.1)
Immature Granulocytes: 0 %
Lymphocytes Absolute: 2.3 x10E3/uL (ref 0.7–3.1)
Lymphs: 30 %
MCH: 31.1 pg (ref 26.6–33.0)
MCHC: 33.9 g/dL (ref 31.5–35.7)
MCV: 92 fL (ref 79–97)
Monocytes Absolute: 0.5 x10E3/uL (ref 0.1–0.9)
Monocytes: 6 %
Neutrophils Absolute: 4.7 x10E3/uL (ref 1.4–7.0)
Neutrophils: 62 %
Platelets: 265 x10E3/uL (ref 150–450)
RBC: 4.85 x10E6/uL (ref 4.14–5.80)
RDW: 13.2 % (ref 11.6–15.4)
WBC: 7.7 x10E3/uL (ref 3.4–10.8)

## 2024-09-16 LAB — COMPREHENSIVE METABOLIC PANEL WITH GFR
ALT: 38 IU/L (ref 0–44)
AST: 27 IU/L (ref 0–40)
Albumin: 4.6 g/dL (ref 4.1–5.1)
Alkaline Phosphatase: 94 IU/L (ref 47–123)
BUN/Creatinine Ratio: 8 — ABNORMAL LOW (ref 9–20)
BUN: 10 mg/dL (ref 6–24)
Bilirubin Total: 0.5 mg/dL (ref 0.0–1.2)
CO2: 20 mmol/L (ref 20–29)
Calcium: 9.9 mg/dL (ref 8.7–10.2)
Chloride: 103 mmol/L (ref 96–106)
Creatinine, Ser: 1.21 mg/dL (ref 0.76–1.27)
Globulin, Total: 2.7 g/dL (ref 1.5–4.5)
Glucose: 93 mg/dL (ref 70–99)
Potassium: 4.1 mmol/L (ref 3.5–5.2)
Sodium: 141 mmol/L (ref 134–144)
Total Protein: 7.3 g/dL (ref 6.0–8.5)
eGFR: 77 mL/min/1.73

## 2024-09-16 LAB — FOLATE: Folate: 13.3 ng/mL

## 2024-09-16 LAB — LIPID PANEL W/O CHOL/HDL RATIO
Cholesterol, Total: 270 mg/dL — ABNORMAL HIGH (ref 100–199)
HDL: 48 mg/dL
LDL Chol Calc (NIH): 188 mg/dL — ABNORMAL HIGH (ref 0–99)
Triglycerides: 180 mg/dL — ABNORMAL HIGH (ref 0–149)
VLDL Cholesterol Cal: 34 mg/dL (ref 5–40)

## 2024-09-16 LAB — TSH: TSH: 1.34 u[IU]/mL (ref 0.450–4.500)

## 2024-09-16 LAB — IRON: Iron: 94 ug/dL (ref 38–169)

## 2024-09-16 LAB — VITAMIN B12: Vitamin B-12: 830 pg/mL (ref 232–1245)

## 2024-09-16 LAB — FERRITIN: Ferritin: 51 ng/mL (ref 30–400)

## 2024-09-16 LAB — VITAMIN D 25 HYDROXY (VIT D DEFICIENCY, FRACTURES): Vit D, 25-Hydroxy: 37.1 ng/mL (ref 30.0–100.0)

## 2024-09-16 NOTE — Progress Notes (Signed)
 Contacted via MyChart  Good morning Chad Walsh, your labs have returned and are overall stable with exception of lipid panel which is showing elevations, but not to level where medication is recommended. Your cholesterol is high, but recommendations to make lifestyle changes. Your LDL is above normal.  The LDL is the bad cholesterol.  Over time and in combination with inflammation and other factors, this contributes to plaque which in turn may lead to stroke and/or heart attack down the road.  Sometimes high LDL is primarily genetic, and people might be eating all the right foods but still have high numbers.  Other times, there is room for improvement in one's diet and eating healthier can bring this number down and potentially reduce one's risk of heart attack and/or stroke. To reduce your LDL, Remember - more fruits and vegetables, more fish, and limit red meat and dairy products.  More soy, nuts, beans, barley, lentils, oats and plant sterol ester enriched margarine instead of butter.  I also encourage eliminating sugar and processed food.  Remember, shop on the outside of the grocery store and visit your [employer]. Any questions? Keep being amazing!!  Thank you for allowing me to participate in your care.  I appreciate you. Kindest regards, Mysti Haley

## 2025-03-16 ENCOUNTER — Ambulatory Visit: Payer: MEDICAID | Admitting: Nurse Practitioner
